# Patient Record
Sex: Male | Born: 1935 | Race: White | Hispanic: No | Marital: Married | Smoking: Former smoker
Health system: Southern US, Community
[De-identification: ages and names within clinical notes are randomized; demographics above are authoritative.]

## PROBLEM LIST (undated history)

## (undated) DIAGNOSIS — J40 Bronchitis, not specified as acute or chronic: Secondary | ICD-10-CM

## (undated) DIAGNOSIS — R569 Unspecified convulsions: Secondary | ICD-10-CM

## (undated) DIAGNOSIS — I639 Cerebral infarction, unspecified: Secondary | ICD-10-CM

## (undated) DIAGNOSIS — J309 Allergic rhinitis, unspecified: Secondary | ICD-10-CM

## (undated) DIAGNOSIS — E785 Hyperlipidemia, unspecified: Secondary | ICD-10-CM

## (undated) DIAGNOSIS — I739 Peripheral vascular disease, unspecified: Secondary | ICD-10-CM

## (undated) DIAGNOSIS — I619 Nontraumatic intracerebral hemorrhage, unspecified: Secondary | ICD-10-CM

## (undated) DIAGNOSIS — G4700 Insomnia, unspecified: Secondary | ICD-10-CM

## (undated) DIAGNOSIS — R972 Elevated prostate specific antigen [PSA]: Secondary | ICD-10-CM

## (undated) DIAGNOSIS — N390 Urinary tract infection, site not specified: Secondary | ICD-10-CM

## (undated) DIAGNOSIS — Z978 Presence of other specified devices: Secondary | ICD-10-CM

## (undated) DIAGNOSIS — Z960 Presence of urogenital implants: Secondary | ICD-10-CM

## (undated) DIAGNOSIS — N400 Enlarged prostate without lower urinary tract symptoms: Secondary | ICD-10-CM

## (undated) DIAGNOSIS — J189 Pneumonia, unspecified organism: Secondary | ICD-10-CM

## (undated) DIAGNOSIS — R0602 Shortness of breath: Secondary | ICD-10-CM

## (undated) DIAGNOSIS — G9341 Metabolic encephalopathy: Secondary | ICD-10-CM

## (undated) DIAGNOSIS — R600 Localized edema: Secondary | ICD-10-CM

## (undated) DIAGNOSIS — M179 Osteoarthritis of knee, unspecified: Secondary | ICD-10-CM

## (undated) DIAGNOSIS — I471 Supraventricular tachycardia, unspecified: Secondary | ICD-10-CM

## (undated) DIAGNOSIS — M1710 Unilateral primary osteoarthritis, unspecified knee: Secondary | ICD-10-CM

## (undated) DIAGNOSIS — R002 Palpitations: Secondary | ICD-10-CM

## (undated) HISTORY — DX: Allergic rhinitis, unspecified: J30.9

## (undated) HISTORY — PX: VASECTOMY: SHX75

## (undated) HISTORY — DX: Supraventricular tachycardia, unspecified: I47.10

## (undated) HISTORY — DX: Peripheral vascular disease, unspecified: I73.9

## (undated) HISTORY — DX: Osteoarthritis of knee, unspecified: M17.9

## (undated) HISTORY — DX: Benign prostatic hyperplasia without lower urinary tract symptoms: N40.0

## (undated) HISTORY — DX: Unspecified convulsions: R56.9

## (undated) HISTORY — DX: Palpitations: R00.2

## (undated) HISTORY — DX: Supraventricular tachycardia: I47.1

## (undated) HISTORY — PX: HEMORRHOID SURGERY: SHX153

## (undated) HISTORY — PX: CATARACT EXTRACTION: SUR2

## (undated) HISTORY — PX: US ECHOCARDIOGRAPHY: HXRAD669

## (undated) HISTORY — DX: Shortness of breath: R06.02

## (undated) HISTORY — DX: Hyperlipidemia, unspecified: E78.5

## (undated) HISTORY — DX: Metabolic encephalopathy: G93.41

## (undated) HISTORY — DX: Bronchitis, not specified as acute or chronic: J40

## (undated) HISTORY — DX: Elevated prostate specific antigen (PSA): R97.20

## (undated) HISTORY — DX: Unilateral primary osteoarthritis, unspecified knee: M17.10

## (undated) HISTORY — DX: Insomnia, unspecified: G47.00

## (undated) HISTORY — DX: Localized edema: R60.0

---

## 2000-02-26 ENCOUNTER — Other Ambulatory Visit: Admission: RE | Admit: 2000-02-26 | Discharge: 2000-02-26 | Payer: Self-pay | Admitting: Urology

## 2001-06-03 ENCOUNTER — Encounter: Payer: Self-pay | Admitting: Emergency Medicine

## 2001-06-03 ENCOUNTER — Emergency Department (HOSPITAL_COMMUNITY): Admission: EM | Admit: 2001-06-03 | Discharge: 2001-06-03 | Payer: Self-pay | Admitting: Emergency Medicine

## 2006-06-24 ENCOUNTER — Encounter: Admission: RE | Admit: 2006-06-24 | Discharge: 2006-06-24 | Payer: Self-pay | Admitting: Internal Medicine

## 2011-09-03 DIAGNOSIS — R3915 Urgency of urination: Secondary | ICD-10-CM | POA: Insufficient documentation

## 2011-09-03 DIAGNOSIS — R972 Elevated prostate specific antigen [PSA]: Secondary | ICD-10-CM | POA: Insufficient documentation

## 2012-03-03 DIAGNOSIS — N400 Enlarged prostate without lower urinary tract symptoms: Secondary | ICD-10-CM | POA: Insufficient documentation

## 2013-08-24 ENCOUNTER — Ambulatory Visit: Payer: Self-pay | Admitting: Nurse Practitioner

## 2013-08-25 ENCOUNTER — Encounter: Payer: Self-pay | Admitting: Nurse Practitioner

## 2013-08-25 ENCOUNTER — Ambulatory Visit (INDEPENDENT_AMBULATORY_CARE_PROVIDER_SITE_OTHER): Payer: Medicare Other | Admitting: Nurse Practitioner

## 2013-08-25 ENCOUNTER — Encounter (INDEPENDENT_AMBULATORY_CARE_PROVIDER_SITE_OTHER): Payer: Self-pay

## 2013-08-25 VITALS — BP 112/65 | HR 71 | Ht 68.0 in | Wt 201.0 lb

## 2013-08-25 DIAGNOSIS — Z79899 Other long term (current) drug therapy: Secondary | ICD-10-CM

## 2013-08-25 DIAGNOSIS — G40309 Generalized idiopathic epilepsy and epileptic syndromes, not intractable, without status epilepticus: Secondary | ICD-10-CM

## 2013-08-25 NOTE — Patient Instructions (Signed)
Need labs in the am, do not take Dilantin or Depakote prior to blood draw Renew meds for the next year F/U yearly and prn

## 2013-08-25 NOTE — Progress Notes (Signed)
[facility]  PATIENT: Zachary Ali DOB: May 11, 1929   REASON FOR VISIT: Followup for seizure disorder   HISTORY OF PRESENT ILLNESS: Zachary Ali, 79 year old  right-handed retired Technical sales engineer, is accompanied by his wife at today''s clinical visit returns for followup.  He was last seen 08/23/2012. No seizures in several years. Denies any falls, no assistive device.Discussed again stopping Dilantin and adding Keppra but pt is reluctant. No new complaints. He has not had recent labs. No new neurologic complaints     HISTORY: initial evaluation by  Dr Terrace Arabia 08/17/10. He is referred to this clinic, because there was no longer recurrent seizure, and with this worsening gait difficulty, wondering if we can make some adjustment of his epileptiic medications. UPDATE: he feels better after stopping PB, Dr. Terrace Arabia  tried to convince him to stop dilantin for mild gait difficulty, gingiva hypertrophy. He is hesitate to do so.  PMHx.He has past medical history of tachycardia is taking beta blocker, also has a history of seizure, has been on long-term epileptic medications.  He began to experience seizures since age 70, initially it was frequent Petit mal seizure, up to 12-16 episodes in a day, he has been treated with combination of Dilantin, and phenobarbital since age 65, without effectively control his petit mal seizure. In addition he also has some infrequent generalized tonic-clonic seizure. Iin 1976, he was started on Depakote, which has drastically change his seizure,  his  petit mal seizure was  under much better control, while he was  on Depakote and phenobarbital combination, he developed a generalized tonic-clonic seizure, Dilantin was reintroduced later, he has been on current dose of 3 medication for 35 years.   Dilantin100 mg t.i.d. phenobarbital 100 mg every morning. Depakote500 mg t.i.d.  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 Over the  past couple years, he was noticed to have unsteady gait. he denied incontinence,  bilateral lower extremity paresthesia. He has successfully weaned off phenobarbital now,  he feels better afterwards, there is no recurrent seizure.  He is continue taking depakote 500mg  tid, dilantin 100 tid.  MRI brain showed mild atrophy, no acute lesions.  EEG, had mild background slowing, no epilepsy discharges.  TODAY: 11/1/12No seizures in the past year. Discussed again stopping Dilantin and adding Keppra but pt is reluctant. No new complaints.   REVIEW OF SYSTEMS: Full 14 system review of systems performed and notable only for:  Constitutional: N/A  Cardiovascular: N/A  Ear/Nose/Throat: N/A  Skin: N/A  Eyes: N/A  Respiratory: N/A  Gastroitestinal: N/A  Hematology/Lymphatic: N/A  Endocrine: N/A Musculoskeletal:N/A  Allergy/Immunology: N/A  Neurological: N/A Psychiatric: N/A   ALLERGIES: No Known Allergies  HOME MEDICATIONS: No outpatient prescriptions prior to visit.   No facility-administered medications prior to visit.    PAST MEDICAL HISTORY: Past Medical History  Diagnosis Date  . Seizure   . SVD (spontaneous vaginal delivery)     PAST SURGICAL HISTORY: Past Surgical History  Procedure Laterality Date  . Vasectomy      FAMILY HISTORY: Family History  Problem Relation Age of Onset  . Heart failure      SOCIAL HISTORY: History   Social History  . Marital Status: Married    Spouse Name: Hope    Number of Children: 0  . Years of Education: College   Occupational History  . Not on file.   Social History Main Topics  . Smoking status: Former Games developer  . Smokeless tobacco: Never Used  .  Alcohol Use: 3.0 oz/week    5 Glasses of wine per week     Comment: weekly  . Drug Use: No  . Sexual Activity: Not on file   Other Topics Concern  . Not on file   Social History Narrative   Patient lives at home with wife [place].    Patient has no children.    Patient is  college graduated.    Patient is retired.    Patient is right handed.            PHYSICAL EXAM  Filed Vitals:   08/25/13 1405  BP: 112/65  Pulse: 71  Height: 5\' 8"  (1.727 m)  Weight: 201 lb (91.173 kg)   Body mass index is 30.57 kg/(m^2).  Generalized: Well developed, in no acute distress   Neurological examination   Mentation: Alert oriented to time, place, history taking. Follows all commands speech and language fluent  Cranial nerve II-XII: Pupils were equal round reactive to light extraocular movements were full, visual field were full on confrontational test. Facial sensation and strength were normal. hearing was intact to finger rubbing bilaterally. Uvula tongue midline. head turning and shoulder shrug and were normal and symmetric.Tongue protrusion into cheek strength was normal. Motor: normal bulk and tone, full strength in the BUE, BLE, fine finger movements normal, no pronator drift. No focal weakness Coordination: finger-nose-finger, heel-to-shin bilaterally, no dysmetria Reflexes: Diminished and symmetric upper and lower Gait and Station: Rising up from seated position without assistance, wide based cautious gait no difficulty with turning, no assistive device   DIAGNOSTIC DATA (LABS, IMAGING, TESTING) -None to review   ASSESSMENT AND PLAN  79 y.o. year old male  has a past medical history of Seizure and small vessel disease. Patient is currently on Depakote and Dilantin and denies side effects attempts have been made to taper him off of Dilantin in place him on Keppra however he has not been willing to do so  Will obtain trough labs in the am. Do not take Dilantin or Depakote prior to blood draw Renew meds for the next year after labs are back F/U yearly and prn  Nilda Riggs, [facility], [facility], APRN  [facility] [address], [address] [place], [place] [address] [phone]

## 2013-08-26 ENCOUNTER — Other Ambulatory Visit (INDEPENDENT_AMBULATORY_CARE_PROVIDER_SITE_OTHER): Payer: Medicare Other

## 2013-08-26 DIAGNOSIS — Z0289 Encounter for other administrative examinations: Secondary | ICD-10-CM

## 2013-08-27 LAB — CBC WITH DIFFERENTIAL/PLATELET
Basophils Absolute: 0 10*3/uL (ref 0.0–0.2)
Basos: 1 %
Eos: 3 %
Eosinophils Absolute: 0.2 10*3/uL (ref 0.0–0.4)
HCT: 39.9 % (ref 37.5–51.0)
Hemoglobin: 13.7 g/dL (ref 12.6–17.7)
Lymphocytes Absolute: 1.6 10*3/uL (ref 0.7–3.1)
Lymphs: 26 %
MCH: 30.4 pg (ref 26.6–33.0)
MCHC: 34.3 g/dL (ref 31.5–35.7)
MCV: 89 fL (ref 79–97)
Monocytes Absolute: 0.9 10*3/uL (ref 0.1–0.9)
Monocytes: 14 %
Neutrophils Absolute: 3.4 10*3/uL (ref 1.4–7.0)
Neutrophils Relative %: 56 %
RBC: 4.5 x10E6/uL (ref 4.14–5.80)
RDW: 17.6 % — ABNORMAL HIGH (ref 12.3–15.4)
WBC: 6.1 10*3/uL (ref 3.4–10.8)

## 2013-08-27 LAB — COMPREHENSIVE METABOLIC PANEL
ALT: 19 IU/L (ref 0–44)
AST: 24 IU/L (ref 0–40)
Albumin/Globulin Ratio: 1.7 (ref 1.1–2.5)
Albumin: 3.9 g/dL (ref 3.5–4.7)
Alkaline Phosphatase: 69 IU/L (ref 39–117)
BUN/Creatinine Ratio: 29 — ABNORMAL HIGH (ref 10–22)
BUN: 21 mg/dL (ref 8–27)
CO2: 30 mmol/L — ABNORMAL HIGH (ref 18–29)
Calcium: 8.9 mg/dL (ref 8.6–10.2)
Chloride: 105 mmol/L (ref 96–108)
Creatinine, Ser: 0.73 mg/dL — ABNORMAL LOW (ref 0.76–1.27)
GFR calc Af Amer: 100 mL/min/{1.73_m2} (ref >59)
GFR calc non Af Amer: 86 mL/min/{1.73_m2} (ref >59)
Globulin, Total: 2.3 g/dL (ref 1.5–4.5)
Glucose: 90 mg/dL (ref 65–99)
Potassium: 4.3 mmol/L (ref 3.5–5.2)
Sodium: 142 mmol/L (ref 134–144)
Total Bilirubin: 0.3 mg/dL (ref 0.0–1.2)
Total Protein: 6.2 g/dL (ref 6.0–8.5)

## 2013-08-27 NOTE — Progress Notes (Signed)
Quick Note:  Shared good labs per Ms Daphine Deutscher with patient, he understood ______

## 2013-10-04 ENCOUNTER — Other Ambulatory Visit: Payer: Self-pay

## 2013-10-04 MED ORDER — DIVALPROEX SODIUM 500 MG PO DR TAB: 500.0000 mg | DELAYED_RELEASE_TABLET | Freq: Three times a day (TID) | ORAL | Status: DC

## 2013-10-29 ENCOUNTER — Other Ambulatory Visit: Payer: Self-pay

## 2013-10-29 MED ORDER — DIVALPROEX SODIUM 500 MG PO DR TAB: 500.0000 mg | DELAYED_RELEASE_TABLET | Freq: Three times a day (TID) | ORAL | Status: DC

## 2013-11-30 ENCOUNTER — Other Ambulatory Visit: Payer: Self-pay | Admitting: Neurology

## 2013-12-10 ENCOUNTER — Other Ambulatory Visit: Payer: Self-pay

## 2013-12-10 MED ORDER — PHENYTOIN SODIUM EXTENDED 100 MG PO CAPS: 100.0000 mg | ORAL_CAPSULE | Freq: Three times a day (TID) | ORAL | Status: DC

## 2014-04-29 ENCOUNTER — Encounter: Payer: Self-pay | Admitting: *Deleted

## 2014-06-25 ENCOUNTER — Emergency Department (HOSPITAL_COMMUNITY): Payer: Medicare Other

## 2014-06-25 ENCOUNTER — Encounter (HOSPITAL_COMMUNITY): Payer: Self-pay | Admitting: Emergency Medicine

## 2014-06-25 ENCOUNTER — Inpatient Hospital Stay (HOSPITAL_COMMUNITY)
Admission: EM | Admit: 2014-06-25 | Discharge: 2014-06-29 | DRG: 689 | Disposition: A | Discharge status: Skilled Nursing Facility | Payer: Medicare Other | Attending: Internal Medicine | Admitting: Internal Medicine

## 2014-06-25 ENCOUNTER — Inpatient Hospital Stay (HOSPITAL_COMMUNITY): Payer: Medicare Other

## 2014-06-25 DIAGNOSIS — D696 Thrombocytopenia, unspecified: Secondary | ICD-10-CM | POA: Diagnosis present

## 2014-06-25 DIAGNOSIS — R339 Retention of urine, unspecified: Secondary | ICD-10-CM | POA: Diagnosis not present

## 2014-06-25 DIAGNOSIS — I10 Essential (primary) hypertension: Secondary | ICD-10-CM | POA: Diagnosis present

## 2014-06-25 DIAGNOSIS — R7989 Other specified abnormal findings of blood chemistry: Secondary | ICD-10-CM

## 2014-06-25 DIAGNOSIS — N390 Urinary tract infection, site not specified: Secondary | ICD-10-CM | POA: Diagnosis present

## 2014-06-25 DIAGNOSIS — G8190 Hemiplegia, unspecified affecting unspecified side: Secondary | ICD-10-CM | POA: Diagnosis present

## 2014-06-25 DIAGNOSIS — G40909 Epilepsy, unspecified, not intractable, without status epilepticus: Secondary | ICD-10-CM | POA: Diagnosis present

## 2014-06-25 DIAGNOSIS — G9340 Encephalopathy, unspecified: Secondary | ICD-10-CM | POA: Diagnosis present

## 2014-06-25 DIAGNOSIS — Z87891 Personal history of nicotine dependence: Secondary | ICD-10-CM | POA: Diagnosis not present

## 2014-06-25 DIAGNOSIS — E785 Hyperlipidemia, unspecified: Secondary | ICD-10-CM | POA: Diagnosis present

## 2014-06-25 DIAGNOSIS — I63239 Cerebral infarction due to unspecified occlusion or stenosis of unspecified carotid arteries: Secondary | ICD-10-CM

## 2014-06-25 DIAGNOSIS — I639 Cerebral infarction, unspecified: Secondary | ICD-10-CM | POA: Diagnosis present

## 2014-06-25 DIAGNOSIS — I63132 Cerebral infarction due to embolism of left carotid artery: Secondary | ICD-10-CM

## 2014-06-25 DIAGNOSIS — Z79899 Other long term (current) drug therapy: Secondary | ICD-10-CM | POA: Diagnosis not present

## 2014-06-25 DIAGNOSIS — G40309 Generalized idiopathic epilepsy and epileptic syndromes, not intractable, without status epilepticus: Secondary | ICD-10-CM

## 2014-06-25 DIAGNOSIS — R4789 Other speech disturbances: Secondary | ICD-10-CM | POA: Diagnosis present

## 2014-06-25 DIAGNOSIS — I63039 Cerebral infarction due to thrombosis of unspecified carotid artery: Secondary | ICD-10-CM

## 2014-06-25 DIAGNOSIS — R4182 Altered mental status, unspecified: Secondary | ICD-10-CM

## 2014-06-25 LAB — CBC WITH DIFFERENTIAL/PLATELET
BASOS ABS: 0 10*3/uL (ref 0.0–0.1)
Basophils Relative: 0 % (ref 0–1)
EOS ABS: 0 10*3/uL (ref 0.0–0.7)
Eosinophils Relative: 0 % (ref 0–5)
HEMATOCRIT: 39.1 % (ref 39.0–52.0)
Hemoglobin: 13.1 g/dL (ref 13.0–17.0)
Lymphocytes Relative: 13 % (ref 12–46)
Lymphs Abs: 1.4 10*3/uL (ref 0.7–4.0)
MCH: 32.6 pg (ref 26.0–34.0)
MCHC: 33.5 g/dL (ref 30.0–36.0)
MCV: 97.3 fL (ref 78.0–100.0)
Monocytes Absolute: 1.7 10*3/uL — ABNORMAL HIGH (ref 0.1–1.0)
Monocytes Relative: 16 % — ABNORMAL HIGH (ref 3–12)
NEUTROS ABS: 7.6 10*3/uL (ref 1.7–7.7)
NEUTROS PCT: 71 % (ref 43–77)
Platelets: 89 10*3/uL — ABNORMAL LOW (ref 150–400)
RBC: 4.02 MIL/uL — ABNORMAL LOW (ref 4.22–5.81)
RDW: 14.4 % (ref 11.5–15.5)
WBC: 10.7 10*3/uL — ABNORMAL HIGH (ref 4.0–10.5)

## 2014-06-25 LAB — COMPREHENSIVE METABOLIC PANEL
ALT: 17 U/L (ref 0–53)
AST: 23 U/L (ref 0–37)
Albumin: 2.8 g/dL — ABNORMAL LOW (ref 3.5–5.2)
Alkaline Phosphatase: 61 U/L (ref 39–117)
Anion gap: 13 (ref 5–15)
BUN: 22 mg/dL (ref 6–23)
CALCIUM: 8.6 mg/dL (ref 8.4–10.5)
CO2: 21 mEq/L (ref 19–32)
CREATININE: 0.84 mg/dL (ref 0.50–1.35)
Chloride: 103 mEq/L (ref 96–112)
GFR calc Af Amer: >90 mL/min (ref >90)
GFR calc non Af Amer: 79 mL/min — ABNORMAL LOW (ref >90)
GLUCOSE: 96 mg/dL (ref 70–99)
Potassium: 3.9 mEq/L (ref 3.7–5.3)
SODIUM: 137 meq/L (ref 137–147)
Total Bilirubin: 0.3 mg/dL (ref 0.3–1.2)
Total Protein: 6 g/dL (ref 6.0–8.3)

## 2014-06-25 LAB — TROPONIN I

## 2014-06-25 LAB — PRO B NATRIURETIC PEPTIDE: Pro B Natriuretic peptide (BNP): 953 pg/mL — ABNORMAL HIGH (ref 0–450)

## 2014-06-25 MED ORDER — PHENYTOIN SODIUM EXTENDED 100 MG PO CAPS
100.0000 mg | ORAL_CAPSULE | Freq: Three times a day (TID) | ORAL | Status: DC
  Administered 2014-06-26 – 2014-06-29 (×10): 100 mg via ORAL
  Filled 2014-06-25 (×9): qty 1

## 2014-06-25 MED ORDER — STROKE: EARLY STAGES OF RECOVERY BOOK
Freq: Once | Status: DC
  Filled 2014-06-25 (×2): qty 1

## 2014-06-25 MED ORDER — SENNOSIDES-DOCUSATE SODIUM 8.6-50 MG PO TABS
1.0000 | ORAL_TABLET | Freq: Every evening | ORAL | Status: DC | PRN
  Filled 2014-06-25: qty 1

## 2014-06-25 MED ORDER — METOPROLOL SUCCINATE ER 25 MG PO TB24
25.0000 mg | ORAL_TABLET | Freq: Every day | ORAL | Status: DC
  Administered 2014-06-26 – 2014-06-29 (×4): 25 mg via ORAL
  Filled 2014-06-25 (×4): qty 1

## 2014-06-25 MED ORDER — ENOXAPARIN SODIUM 30 MG/0.3ML ~~LOC~~ SOLN
30.0000 mg | SUBCUTANEOUS | Status: DC
  Administered 2014-06-25 – 2014-06-26 (×2): 30 mg via SUBCUTANEOUS
  Filled 2014-06-25 (×2): qty 0.3

## 2014-06-25 MED ORDER — DIVALPROEX SODIUM 500 MG PO DR TAB
500.0000 mg | DELAYED_RELEASE_TABLET | Freq: Three times a day (TID) | ORAL | Status: DC
  Administered 2014-06-26 – 2014-06-29 (×10): 500 mg via ORAL
  Filled 2014-06-25 (×9): qty 1

## 2014-06-25 NOTE — ED Notes (Signed)
Patient transported to CT 

## 2014-06-25 NOTE — ED Notes (Signed)
Pt refusing to be cleaned. Pt aware he has urine on his pants and gown. This RN asked patient if I could change his gown, linens and remove his pants. Pt refused. I told him politely I do not want him sitting in his own urine and patient stated he knows and he does not want to be changed or cleaned. Wife at bedside and aware and is standing by patients wishes.

## 2014-06-25 NOTE — Consult Note (Signed)
Referring Physician: Radford Pax    Chief Complaint: Difficulty with speech  HPI: Zachary Ali is an 80 y.o. 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  He noted at 9PM that he was having dizziness and also noted that he was having difficulty playing the piece.  He was able to drive home after practice and his wife noted that he was having difficulty with speech.  He slept last evening and on awakening his symptoms were still present but did improve in the morning.  After eating breakfast they recurred and have continued.  Had difficulty with gait and needed to hold onto his wife for ambulation.  Patient presented for evaluation at her request.    Date last known well: Date: 06/24/2014 Time last known well: Time: 21:00 tPA Given: No: Outside time window  Past Medical History  Diagnosis Date  . Seizure     since childhood, Dr. Debarah Crape; off phenoparb since 2011, after many yr, doing ok  . Small vessel disease   . Allergic rhinitis   . BPH (benign prostatic hypertrophy)     seen urology, Dr. Earlene Plater  . SVT (supraventricular tachycardia)   . Elevated PSA     dr. Earlene Plater PSA 3-6; no PSA-urology f/u for exam yrly  . Mild depression   . Dyslipidemia     diet control  . Edema leg     hx of  . Insomnia   . OA (osteoarthritis) of knee     left   . Bronchitis     episode of 2/10  . SOB (shortness of breath)   . Palpitations     w/u with Card-Dr. Anne Fu  . Paroxysmal supraventricular tachycardia     Past Surgical History  Procedure Laterality Date  . Vasectomy    . US echocardiography      ok; stress test ok-05-2013 nl LVF, tr AR  . Hemorrhoid surgery    . Cataract extraction      2011-12    Family History  Problem Relation Age of Onset  . Heart failure    . Heart disease Mother   . Coronary artery disease Mother   . Coronary artery disease Father   . Heart disease Father    Social History:  reports that he has quit smoking. He has never used smokeless  tobacco. He reports that he drinks about 3 ounces of alcohol per week. He reports that he does not use illicit drugs.  Allergies: No Known Allergies  Medications: I have reviewed the patient's current medications. Prior to Admission:  Current outpatient prescriptions:divalproex (DEPAKOTE) 500 MG DR tablet, Take 500 mg by mouth 3 (three) times daily., Disp: , Rfl: ;  metoprolol succinate (TOPROL-XL) 25 MG 24 hr tablet, Take 25 mg by mouth daily., Disp: , Rfl: ;  phenytoin (DILANTIN) 100 MG ER capsule, Take 1 capsule (100 mg total) by mouth 3 (three) times daily., Disp: 90 capsule, Rfl: 8  ROS: History obtained from the patient  General ROS: negative for - chills, fatigue, fever, night sweats, weight gain or weight loss Psychological ROS: negative for - behavioral disorder, hallucinations, memory difficulties, mood swings or suicidal ideation Ophthalmic ROS: negative for - blurry vision, double vision, eye pain or loss of vision ENT ROS: negative for - epistaxis, nasal discharge, oral lesions, sore throat, tinnitus or vertigo Allergy and Immunology ROS: negative for - hives or itchy/watery eyes Hematological and Lymphatic ROS: negative for - bleeding problems, bruising or swollen lymph nodes  Endocrine ROS: negative for - galactorrhea, hair pattern changes, polydipsia/polyuria or temperature intolerance Respiratory ROS: negative for - cough, hemoptysis, shortness of breath or wheezing Cardiovascular ROS: BLE edema Gastrointestinal ROS: negative for - abdominal pain, diarrhea, hematemesis, nausea/vomiting or stool incontinence Genito-Urinary ROS: negative for - dysuria, hematuria, incontinence or urinary frequency/urgency Musculoskeletal ROS: negative for - joint swelling or muscular weakness Neurological ROS: as noted in HPI Dermatological ROS: negative for rash and skin lesion changes  Physical Examination: Blood pressure 138/55, pulse 80, temperature 98.9 F (37.2 C), temperature source  Oral, resp. rate 21, SpO2 100.00%.  Neurologic Examination: Mental Status: Alert, oriented.  Speech halting and at times responses not appropriate to questions being asked.  Follows some simple commands but has difficulty with others, ie told patient to open his eyes and he kept opening his mouth instead.  Unable to follow 3-step commands.  Patient does not appreciate any deficits.   Cranial Nerves: II: Discs flat bilaterally; Visual fields grossly normal, pupils equal, round, reactive to light and accommodation III,IV, VI: ptosis not present, extra-ocular motions intact bilaterally V,VII: smile symmetric, facial light touch sensation normal bilaterally VIII: hearing normal bilaterally IX,X: gag reflex present XI: bilateral shoulder shrug XII: midline tongue extension Motor: Right : Upper extremity   5-/5 With pronator drift   Left:     Upper extremity   5/5  Lower extremity   4+/5      Lower extremity   5/5 Tone and bulk:normal tone throughout; no atrophy noted Sensory: Pinprick and light touch intact throughout, bilaterally Deep Tendon Reflexes: 2+ and symmetric in the upper extremities and absent in the lower extremities.   Plantars: Right: upgoing   Left: downgoing Cerebellar: normal finger-to-nose and normal heel-to-shin testing bilaterally Gait: Unable to test secondary to safety concerns CV: pulses palpable throughout     Laboratory Studies:  Basic Metabolic Panel:  Recent Labs Lab 06/25/14 1901  NA 137  K 3.9  CL 103  CO2 21  GLUCOSE 96  BUN 22  CREATININE 0.84  CALCIUM 8.6    Liver Function Tests:  Recent Labs Lab 06/25/14 1901  AST 23  ALT 17  ALKPHOS 61  BILITOT 0.3  PROT 6.0  ALBUMIN 2.8*   No results found for this basename: LIPASE, AMYLASE,  in the last 168 hours No results found for this basename: AMMONIA,  in the last 168 hours  CBC:  Recent Labs Lab 06/25/14 1901  WBC 10.7*  NEUTROABS 7.6  HGB 13.1  HCT 39.1  MCV 97.3  PLT 89*     Cardiac Enzymes:  Recent Labs Lab 06/25/14 1902  TROPONINI <0.30    BNP: No components found with this basename: POCBNP,   CBG: No results found for this basename: GLUCAP,  in the last 168 hours  Microbiology: No results found for this or any previous visit.  Coagulation Studies: No results found for this basename: LABPROT, INR,  in the last 72 hours  Urinalysis: No results found for this basename: COLORURINE, APPERANCEUR, LABSPEC, PHURINE, GLUCOSEU, HGBUR, BILIRUBINUR, KETONESUR, PROTEINUR, UROBILINOGEN, NITRITE, LEUKOCYTESUR,  in the last 168 hours  Lipid Panel: No results found for this basename: chol, trig, hdl, cholhdl, vldl, ldlcalc    HgbA1C:  No results found for this basename: HGBA1C    Urine Drug Screen:   No results found for this basename: labopia, cocainscrnur, labbenz, amphetmu, thcu, labbarb    Alcohol Level: No results found for this basename: ETH,  in the last 168 hours  Other results:  EKG: sinus rhythm at 77 bpm.  Imaging: Dg Chest 2 View  06/25/2014   CLINICAL DATA:  Cough.  EXAM: CHEST  2 VIEW  COMPARISON:  04/08/2013.  FINDINGS: The cardiac silhouette, mediastinal and hilar contours are within normal limits and stable. There is tortuosity and calcification of the thoracic aorta. The lungs are clear. No pleural effusion. The bony thorax is intact.  IMPRESSION: No acute cardiopulmonary findings.   Electronically Signed   By: Loralie Champagne M.D.   On: 06/25/2014 19:14   Ct Head Wo Contrast  06/25/2014   CLINICAL DATA:  Slurred speech and difficulty walking.  EXAM: CT HEAD WITHOUT CONTRAST  TECHNIQUE: Contiguous axial images were obtained from the base of the skull through the vertex without intravenous contrast.  COMPARISON:  06/24/2006.  FINDINGS: Stable age related cerebral atrophy, ventriculomegaly and periventricular white matter disease. 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 No extra-axial fluid collections are identified.  No CT findings for acute hemispheric infarction or intracranial hemorrhage. No mass lesions. The brainstem and cerebellum are normal.  The bony structures are intact. The paranasal sinuses and mastoid air cells are clear. The globes are intact.  IMPRESSION: Age related cerebral atrophy, ventriculomegaly and periventricular white matter disease.  No acute intracranial findings or mass lesions.   Electronically Signed   By: Loralie Champagne M.D.   On: 06/25/2014 18:28    Assessment: 80 y.o. male presenting with difficulty with speech and a mild right hemiparesis.  Suspect and acute left brain ischemic event.  Head CT reviewed and unremarkable.  Patient presented outside of the time window and therefore not a tPA candidate.  Further work up recommended.    Stroke Risk Factors - hyperlipidemia  Plan: 1. HgbA1c, fasting lipid panel, Dilantin level, Depakote level 2. MRI, MRA  of the brain without contrast 3. PT consult, OT consult, Speech consult 4. Echocardiogram 5. Carotid dopplers 6. Prophylactic therapy-Antiplatelet med: Aspirin - dose  daily 7. Risk factor modification 8. Telemetry monitoring 9. Frequent neuro checks   Thana Farr, MD [facility] [phone] 06/25/2014, 9:04 PM

## 2014-06-25 NOTE — ED Notes (Signed)
Pt refused to pull pants down to use urinal, Pt continued to urinate on self. Pt also refused to take wet pants and underwear off.

## 2014-06-25 NOTE — Progress Notes (Signed)
Patient received from MRI accompanied by E.D nurse . Oriented to unit and room.

## 2014-06-25 NOTE — ED Notes (Signed)
Report attempted 

## 2014-06-25 NOTE — ED Notes (Signed)
Per EMS- pt reports last night slurred speech and trouble walking; called PCP and had appointment this afternoon. At current speech is clear, having some difficulty finding his words. CBG 131, BP 100/54.

## 2014-06-25 NOTE — H&P (Signed)
Called e.d for report. Report receiced. Told patient was going to MRI before coming to floor.

## 2014-06-25 NOTE — ED Provider Notes (Signed)
CSN: [id number]     Arrival date & time 06/25/14  1723 History   First MD Initiated Contact with Patient 06/25/14 1729     Chief Complaint  Patient presents with  . 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  Patient according to his wife still has some confusion which is not normal for him.  He's had no previous history of stroke in the past. Past Medical History  Diagnosis Date  . Seizure     since childhood, Dr. Debarah Crape; off phenoparb since 2011, after many yr, doing ok  . Small vessel disease   . Allergic rhinitis   . BPH (benign prostatic hypertrophy)     seen urology, Dr. Earlene Plater  . SVT (supraventricular tachycardia)   . Elevated PSA     dr. Earlene Plater PSA 3-6; no PSA-urology f/u for exam yrly  . Mild depression   . Dyslipidemia     diet control  . Edema leg     hx of  . Insomnia   . OA (osteoarthritis) of knee     left   . Bronchitis     episode of 2/10  . SOB (shortness of breath)   . Palpitations     w/u with Card-Dr. Anne Fu  . Paroxysmal supraventricular tachycardia    Past Surgical History  Procedure Laterality Date  . Vasectomy    . US echocardiography      ok; stress test ok-05-2013 nl LVF, tr AR  . Hemorrhoid surgery    . Cataract extraction      2011-12   Family History  Problem Relation Age of Onset  . Heart failure    . Heart disease Mother   . Coronary artery disease Mother   . Coronary artery disease Father   . Heart disease Father    History  Substance Use Topics  . Smoking status: Former Games developer  . Smokeless tobacco: Never Used  . Alcohol Use: 3.0 oz/week    5 Glasses of wine per week     Comment: weekly    Review of Systems  All other systems reviewed and are negative  Allergies  Review of patient's allergies indicates no known allergies.  Home Medications   Prior to Admission medications   Medication Sig Start Date End Date  Taking? Authorizing Provider  divalproex (DEPAKOTE) 500 MG DR tablet Take 500 mg by mouth 3 (three) times daily.   Yes Historical Provider, MD  metoprolol succinate (TOPROL-XL) 25 MG 24 hr tablet Take 25 mg by mouth daily.   Yes Historical Provider, MD  phenytoin (DILANTIN) 100 MG ER capsule Take 1 capsule (100 mg total) by mouth 3 (three) times daily. 12/10/13  Yes Carmen Dohmeier, MD   BP 150/78  Pulse 80  Temp(Src) 98.1 F (36.7 C) (Oral)  Resp 18  Ht  (1.727 m)  Wt 171 lb 8.3 oz (77.8 kg)  BMI 26.09 kg/m2  SpO2 99% Physical Exam Physical Exam  Nursing note and vitals reviewed. Constitutional: He is oriented to person, place, and time. He appears well-developed and well-nourished. No distress.  HENT:  Head: Normocephalic and atraumatic.  Eyes: Pupils are equal, round, and reactive to light.  Neck: Normal range of motion.  Cardiovascular: Normal rate and intact distal pulses.  4+ pitting edema bilaterally  Pulmonary/Chest: No respiratory distress.  Abdominal: Normal appearance. He exhibits no distension.  Musculoskeletal: Normal  range of motion.  Neurological: He is alert and oriented to person, place,.. No cranial nerve deficit.  patient is confused about his your birth and his age.  This is not normal according to the wife.  Patient also seems to have periods where he pauses prior to being able to form his words.  I don't find any lateralizing muscular weakness. Skin: Skin is warm and dry. No rash noted.  Psychiatric: He has a normal mood and affect. His behavior is normal.   ED Course  Procedures (including critical care time)  Patient seen and evaluted by neurology in ED.  Stabilized for admission  CRITICAL CARE Performed by: Nelva Nay L Total critical care time: 30 min Critical care time was exclusive of separately billable procedures and treating other patients. Critical care was necessary to treat or prevent imminent or life-threatening deterioration. Critical  care was time spent personally by me on the following activities: development of treatment plan with patient and/or surrogate as well as nursing, discussions with consultants, evaluation of patient's response to treatment, examination of patient, obtaining history from patient or surrogate, ordering and performing treatments and interventions, ordering and review of laboratory studies, ordering and review of radiographic studies, pulse oximetry and re-evaluation of patient's condition.  Labs Review Labs Reviewed  PRO B NATRIURETIC PEPTIDE - Abnormal; Notable for the following:    Pro B Natriuretic peptide (BNP) 953.0 (*)    All other components within normal limits  CBC WITH DIFFERENTIAL - Abnormal; Notable for the following:    WBC 10.7 (*)    RBC 4.02 (*)    Platelets 89 (*)    Monocytes Relative 16 (*)    Monocytes Absolute 1.7 (*)    All other components within normal limits  COMPREHENSIVE METABOLIC PANEL - Abnormal; Notable for the following:    Albumin 2.8 (*)    GFR calc non Af Amer 79 (*)    All other components within normal limits  CBC - Abnormal; Notable for the following:    RBC 3.94 (*)    HCT 37.5 (*)    Platelets 76 (*)    All other components within normal limits  CREATININE, SERUM - Abnormal; Notable for the following:    GFR calc non Af Amer 80 (*)    All other components within normal limits  TROPONIN I  LIPID PANEL  AMMONIA  HEMOGLOBIN A1C  URINALYSIS, ROUTINE W REFLEX MICROSCOPIC  PHENYTOIN LEVEL, TOTAL  VALPROIC ACID LEVEL    Imaging Review Dg Chest 2 View  06/25/2014   CLINICAL DATA:  Cough.  EXAM: CHEST  2 VIEW  COMPARISON:  04/08/2013.  FINDINGS: The cardiac silhouette, mediastinal and hilar contours are within normal limits and stable. There is tortuosity and calcification of the thoracic aorta. The lungs are clear. No pleural effusion. The bony thorax is intact.  IMPRESSION: No acute cardiopulmonary findings.   Electronically Signed   By: Loralie Champagne M.D.   On: 06/25/2014 19:14   Ct Head Wo Contrast  06/25/2014   CLINICAL DATA:  Slurred speech and difficulty walking.  EXAM: CT HEAD WITHOUT CONTRAST  TECHNIQUE: Contiguous axial images were obtained from the base of the skull through the vertex without intravenous contrast.  COMPARISON:  06/24/2006.  FINDINGS: Stable age related cerebral atrophy, ventriculomegaly and periventricular white matter disease. 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 No extra-axial fluid collections are identified. No CT findings for acute hemispheric infarction or intracranial hemorrhage. No mass lesions. The brainstem and cerebellum are  normal.  The bony structures are intact. The paranasal sinuses and mastoid air cells are clear. The globes are intact.  IMPRESSION: Age related cerebral atrophy, ventriculomegaly and periventricular white matter disease.  No acute intracranial findings or mass lesions.   Electronically Signed   By: Loralie Champagne M.D.   On: 06/25/2014 18:28   Mr Brain Wo Contrast  06/25/2014   CLINICAL DATA:  Mild right hemi paresis, speech difficulty.  EXAM: MRI HEAD WITHOUT CONTRAST  MRA HEAD WITHOUT CONTRAST  TECHNIQUE: Multiplanar, multiecho pulse sequences of the brain and surrounding structures were obtained without intravenous contrast. Angiographic images of the head were obtained using MRA technique without contrast.  COMPARISON:  None.  Prior CT from earlier the same day.  FINDINGS: MRI HEAD FINDINGS  Diffuse prominence of the CSF containing spaces is compatible with age-related generalized cerebral atrophy. Minimal T2/FLAIR hyperintensity seen within the periventricular and deep white matter, within normal limits for patient age.  No diffusion-weighted signal abnormality to suggest acute intracranial infarct. Gray-white matter differentiation well maintained. Normal flow voids seen within the intracranial vasculature. There is no intracranial hemorrhage. Few punctate  chronic micro hemorrhages noted within the left frontal lobe, which may be related to underlying hypertension.  No mass lesion or midline shift. Mild ventricular prominence related at global atrophy present without hydrocephalus. No extra-axial fluid collection.  Craniocervical junction widely patent. Pituitary gland within normal limits. No acute abnormality seen about either orbit. Lens replacement noted at the left orbit.  Calvarium and scalp soft tissues demonstrate a normal appearance. Mild multilevel degenerative spondylolysis noted within the upper cervical spine.  Paranasal sinuses and mastoid air cells are clear.  MRA HEAD FINDINGS  ANTERIOR CIRCULATION:  Study is degraded by motion artifact.  Visualized portions of the distal atherosclerotic irregularity present within the cavernous carotid arteries bilaterally without hemodynamically significant stenosis. Supra clinoid segments well opacified.  Multi focal atherosclerotic irregularity noted within the A1 segments without significant stenosis. Anterior communicating artery and anterior cerebral arteries are within normal limits.  Moderate multi focal atherosclerotic irregularity present within the M1 segments bilaterally. There is a severe stenosis within the distal right M1 segment. Stenosis measures approximately 3 mm in length. The right MCA branches are well opacified distally. Left MCA branches are normal.  No aneurysm within the anterior circulation.  POSTERIOR CIRCULATION:  Vertebral arteries are codominant without significant stenosis or occlusion. Posterior inferior cerebral arteries not well visualized. Vertebrobasilar junction and basilar artery are widely patent with antegrade flow. Proximal superior cerebral arteries are widely patent. Fetal origin of the left PCA noted. Posterior lower cerebral arteries are opacified without hemodynamically significant stenosis.  IMPRESSION: MRI HEAD IMPRESSION:  1. No acute intracranial infarct or other  abnormality identified. 2. Moderate generalized cerebral atrophy.  MRA HEAD IMPRESSION:  1. Severe short-segment stenosis within the mid -distal right M1 segment. No other hemodynamically significant stenosis or proximal branch occlusion identified within the intracranial circulation. 2. Moderate multi focal atherosclerotic irregularity within the cavernous carotid arteries and middle cerebral arteries bilaterally. 3. Fetal origin of the left PCA.   Electronically Signed   By: Rise Mu M.D.   On: 06/25/2014 23:43   Mr Maxine Glenn Head/brain Wo Cm  06/25/2014   CLINICAL DATA:  Mild right hemi paresis, speech difficulty.  EXAM: MRI HEAD WITHOUT CONTRAST  MRA HEAD WITHOUT CONTRAST  TECHNIQUE: Multiplanar, multiecho pulse sequences of the brain and surrounding structures were obtained without intravenous contrast. Angiographic images of the head were obtained using MRA technique  without contrast.  COMPARISON:  None.  Prior CT from earlier the same day.  FINDINGS: MRI HEAD FINDINGS  Diffuse prominence of the CSF containing spaces is compatible with age-related generalized cerebral atrophy. Minimal T2/FLAIR hyperintensity seen within the periventricular and deep white matter, within normal limits for patient age.  No diffusion-weighted signal abnormality to suggest acute intracranial infarct. Gray-white matter differentiation well maintained. Normal flow voids seen within the intracranial vasculature. There is no intracranial hemorrhage. Few punctate chronic micro hemorrhages noted within the left frontal lobe, which may be related to underlying hypertension.  No mass lesion or midline shift. Mild ventricular prominence related at global atrophy present without hydrocephalus. No extra-axial fluid collection.  Craniocervical junction widely patent. Pituitary gland within normal limits. No acute abnormality seen about either orbit. Lens replacement noted at the left orbit.  Calvarium and scalp soft tissues  demonstrate a normal appearance. Mild multilevel degenerative spondylolysis noted within the upper cervical spine.  Paranasal sinuses and mastoid air cells are clear.  MRA HEAD FINDINGS  ANTERIOR CIRCULATION:  Study is degraded by motion artifact.  Visualized portions of the distal atherosclerotic irregularity present within the cavernous carotid arteries bilaterally without hemodynamically significant stenosis. Supra clinoid segments well opacified.  Multi focal atherosclerotic irregularity noted within the A1 segments without significant stenosis. Anterior communicating artery and anterior cerebral arteries are within normal limits.  Moderate multi focal atherosclerotic irregularity present within the M1 segments bilaterally. There is a severe stenosis within the distal right M1 segment. Stenosis measures approximately 3 mm in length. The right MCA branches are well opacified distally. Left MCA branches are normal.  No aneurysm within the anterior circulation.  POSTERIOR CIRCULATION:  Vertebral arteries are codominant without significant stenosis or occlusion. Posterior inferior cerebral arteries not well visualized. Vertebrobasilar junction and basilar artery are widely patent with antegrade flow. Proximal superior cerebral arteries are widely patent. Fetal origin of the left PCA noted. Posterior lower cerebral arteries are opacified without hemodynamically significant stenosis.  IMPRESSION: MRI HEAD IMPRESSION:  1. No acute intracranial infarct or other abnormality identified. 2. Moderate generalized cerebral atrophy.  MRA HEAD IMPRESSION:  1. Severe short-segment stenosis within the mid -distal right M1 segment. No other hemodynamically significant stenosis or proximal branch occlusion identified within the intracranial circulation. 2. Moderate multi focal atherosclerotic irregularity within the cavernous carotid arteries and middle cerebral arteries bilaterally. 3. Fetal origin of the left PCA.    Electronically Signed   By: Rise Mu M.D.   On: 06/25/2014 23:43     EKG Interpretation   Date/Time:  Friday June 25 2014 17:32:36 EDT Ventricular Rate:  77 PR Interval:  182 QRS Duration: 89 QT Interval:  369 QTC Calculation: 418 R Axis:   66 Text Interpretation:  Sinus rhythm Confirmed by Avryl Roehm  MD, Norrin Shreffler ([id number])  on 06/25/2014 6:10:14 PM      MDM   Final diagnoses:  Altered mental status, unspecified altered mental status type       Nelia Shi, MD 06/26/14 1050

## 2014-06-25 NOTE — ED Notes (Signed)
Pt still refusing to be cleaned. RN notified.

## 2014-06-25 NOTE — H&P (Signed)
Hospitalist Admission History and Physical  Patient name: Zachary Ali Medical record number: [id number] Date of birth: 04/07/1929 Age: 80 y.o. Gender: male  Primary Care Provider: Georgann Housekeeper, MD  Chief Complaint: encephalopathy, CVA  History of Present Illness:This is a 80 y.o. year old male with significant past medical history of seizure disorder, hx/o SVT, ? Cerebral aneurysm 50-60 years ago  presenting with encephalopathy, CVA.  Wife reports pt with worsening confusion, slurred speech, difficulty walking over past 3-4 days. Pt works as a Environmental education officer. Has had difficulty with this recently. Denies any recent falls. No recent seizure activity. States that this has been well controlled.  Presented to the ER afebrile, hemodynamically stable. BP 120s-130s. Satting >97% on RA. WBC mildly elevated at 10.7. hgb 13.1, Cr 0.84. CXR WNL. Head CT with age related changes, but no acute findings. Wife also reports pt behavior change. Noted to be uncooperative with nurses in ER. EKG NSR. Trop neg x 1. Pro BNP @ 953.   Assessment and Plan: Zachary Ali is a 80 y.o. year old male presenting with encephalopathy, CVA.  Active Problems:   Altered mental state   CVA (cerebral infarction)   1-Encephalopathy/CVA  -proceed down stroke pathway including MRI, MRA, 2D ECHO, carotid dopplers, risk stratification labs -neuro consulted-appreciate input.  -check UA  -check ammonia level   2-Seizure d/o  -well controlled per report  -f/u neuro recs   3-hx/o SVT  -Rate WNL  -BP stable  -cont BB    FEN/GI: NPO pending bedside swallow eval  Prophylaxis: lovenox  Disposition: pending further evaluation  Code Status:Full Code    Patient Active Problem List   Diagnosis Date Noted  . Altered mental state 06/25/2014  . CVA (cerebral infarction) 06/25/2014  . Generalized nonconvulsive epilepsy without mention of intractable epilepsy 08/25/2013   Past Medical History: Past Medical History   Diagnosis Date  . Seizure     since childhood, Dr. Debarah Crape; off phenoparb since 2011, after many yr, doing ok  . Small vessel disease   . Allergic rhinitis   . BPH (benign prostatic hypertrophy)     seen urology, Dr. Earlene Plater  . SVT (supraventricular tachycardia)   . Elevated PSA     dr. Earlene Plater PSA 3-6; no PSA-urology f/u for exam yrly  . Mild depression   . Dyslipidemia     diet control  . Edema leg     hx of  . Insomnia   . OA (osteoarthritis) of knee     left   . Bronchitis     episode of 2/10  . SOB (shortness of breath)   . Palpitations     w/u with Card-Dr. Anne Fu  . Paroxysmal supraventricular tachycardia     Past Surgical History: Past Surgical History  Procedure Laterality Date  . Vasectomy    . US echocardiography      ok; stress test ok-05-2013 nl LVF, tr AR  . Hemorrhoid surgery    . Cataract extraction      2011-12    Social History: History   Social History  . Marital Status: Married    Spouse Name: Hope    Number of Children: 0  . Years of Education: Automotive engineer   Social History Main Topics  . Smoking status: Former Games developer  . Smokeless tobacco: Never Used  . Alcohol Use: 3.0 oz/week    5 Glasses of wine per week     Comment: weekly  . Drug Use: No  . Sexual Activity: None  Other Topics Concern  . None   Social History Narrative   Patient lives at home with wife [place].    Patient has no children.    Patient is college graduated.    Patient is retired.    Patient is right handed.           Family History: Family History  Problem Relation Age of Onset  . Heart failure    . Heart disease Mother   . Coronary artery disease Mother   . Coronary artery disease Father   . Heart disease Father     Allergies: No Known Allergies  Current Facility-Administered Medications  Medication Dose Route Frequency Provider Last Rate Last Dose  .  stroke: mapping our early stages of recovery book   Does not apply Once Doree Albee, MD      . divalproex  (DEPAKOTE) DR tablet 500 mg  500 mg Oral TID Doree Albee, MD      . enoxaparin (LOVENOX) injection 30 mg  30 mg Subcutaneous Q24H Doree Albee, MD      . Melene Muller ON 06/26/2014] metoprolol succinate (TOPROL-XL) 24 hr tablet 25 mg  25 mg Oral Daily Doree Albee, MD      . phenytoin (DILANTIN) ER capsule 100 mg  100 mg Oral TID Doree Albee, MD      . senna-docusate (Senokot-S) tablet 1 tablet  1 tablet Oral QHS PRN Doree Albee, MD       Current Outpatient Prescriptions  Medication Sig Dispense Refill  . divalproex (DEPAKOTE) 500 MG DR tablet Take 500 mg by mouth 3 (three) times daily.      . metoprolol succinate (TOPROL-XL) 25 MG 24 hr tablet Take 25 mg by mouth daily.      . phenytoin (DILANTIN) 100 MG ER capsule Take 1 capsule (100 mg total) by mouth 3 (three) times daily.  90 capsule  8   Review Of Systems: 12 point ROS negative except as noted above in HPI.  Physical Exam: Filed Vitals:   06/25/14 2036  BP: 138/55  Pulse: 80  Temp:   Resp: 21    General: cooperative and confused  HEENT: PERRLA and extra ocular movement intact Heart: S1, S2 normal, no murmur, rub or gallop, regular rate and rhythm Lungs: clear to auscultation, no wheezes or rales and unlabored breathing Abdomen: abdomen is soft without significant tenderness, masses, organomegaly or guarding Extremities: 2+ peripheral pulses, 1-2 + edema bilaterally  Skin:no rashes Neurology: no focal motor-sensory deficits, oriented only to place x 1  Labs and Imaging: Lab Results  Component Value Date/Time   NA 137 06/25/2014  7:01 PM   NA 142 08/26/2013  9:05 AM   K 3.9 06/25/2014  7:01 PM   CL 103 06/25/2014  7:01 PM   CO2 21 06/25/2014  7:01 PM   BUN 22 06/25/2014  7:01 PM   BUN 21 08/26/2013  9:05 AM   CREATININE 0.84 06/25/2014  7:01 PM   GLUCOSE 96 06/25/2014  7:01 PM   GLUCOSE 90 08/26/2013  9:05 AM   Lab Results  Component Value Date   WBC 10.7* 06/25/2014   HGB 13.1 06/25/2014   HCT 39.1 06/25/2014   MCV 97.3  06/25/2014   PLT 89* 06/25/2014    Dg Chest 2 View  06/25/2014   CLINICAL DATA:  Cough.  EXAM: CHEST  2 VIEW  COMPARISON:  04/08/2013.  FINDINGS: The cardiac silhouette, mediastinal and hilar contours are within normal limits and stable. There is tortuosity  and calcification of the thoracic aorta. The lungs are clear. No pleural effusion. The bony thorax is intact.  IMPRESSION: No acute cardiopulmonary findings.   Electronically Signed   By: Loralie Champagne M.D.   On: 06/25/2014 19:14   Ct Head Wo Contrast  06/25/2014   CLINICAL DATA:  Slurred speech and difficulty walking.  EXAM: CT HEAD WITHOUT CONTRAST  TECHNIQUE: Contiguous axial images were obtained from the base of the skull through the vertex without intravenous contrast.  COMPARISON:  06/24/2006.  FINDINGS: Stable age related cerebral atrophy, ventriculomegaly and periventricular white matter disease. 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 No extra-axial fluid collections are identified. No CT findings for acute hemispheric infarction or intracranial hemorrhage. No mass lesions. The brainstem and cerebellum are normal.  The bony structures are intact. The paranasal sinuses and mastoid air cells are clear. The globes are intact.  IMPRESSION: Age related cerebral atrophy, ventriculomegaly and periventricular white matter disease.  No acute intracranial findings or mass lesions.   Electronically Signed   By: Loralie Champagne M.D.   On: 06/25/2014 18:28           Doree Albee MD  Pager: [phone]

## 2014-06-25 NOTE — Progress Notes (Signed)
Patients spouse brought to floor from E.D

## 2014-06-26 DIAGNOSIS — N390 Urinary tract infection, site not specified: Principal | ICD-10-CM

## 2014-06-26 DIAGNOSIS — G459 Transient cerebral ischemic attack, unspecified: Secondary | ICD-10-CM

## 2014-06-26 DIAGNOSIS — G40909 Epilepsy, unspecified, not intractable, without status epilepticus: Secondary | ICD-10-CM

## 2014-06-26 DIAGNOSIS — R799 Abnormal finding of blood chemistry, unspecified: Secondary | ICD-10-CM

## 2014-06-26 LAB — URINE MICROSCOPIC-ADD ON

## 2014-06-26 LAB — CBC
HCT: 37.5 % — ABNORMAL LOW (ref 39.0–52.0)
HEMOGLOBIN: 13 g/dL (ref 13.0–17.0)
MCH: 33 pg (ref 26.0–34.0)
MCHC: 34.7 g/dL (ref 30.0–36.0)
MCV: 95.2 fL (ref 78.0–100.0)
Platelets: 76 10*3/uL — ABNORMAL LOW (ref 150–400)
RBC: 3.94 MIL/uL — ABNORMAL LOW (ref 4.22–5.81)
RDW: 14.3 % (ref 11.5–15.5)
WBC: 9.7 10*3/uL (ref 4.0–10.5)

## 2014-06-26 LAB — CREATININE, SERUM
Creatinine, Ser: 0.81 mg/dL (ref 0.50–1.35)
GFR calc Af Amer: >90 mL/min (ref >90)
GFR, EST NON AFRICAN AMERICAN: 80 mL/min — AB (ref >90)

## 2014-06-26 LAB — URINALYSIS, ROUTINE W REFLEX MICROSCOPIC
Bilirubin Urine: NEGATIVE
Glucose, UA: NEGATIVE mg/dL
Ketones, ur: 15 mg/dL — AB
Nitrite: NEGATIVE
PROTEIN: 30 mg/dL — AB
Specific Gravity, Urine: 1.02 (ref 1.005–1.030)
UROBILINOGEN UA: 1 mg/dL (ref 0.0–1.0)
pH: 6 (ref 5.0–8.0)

## 2014-06-26 LAB — LIPID PANEL
Cholesterol: 142 mg/dL (ref 0–200)
HDL: 55 mg/dL (ref >39)
LDL Cholesterol: 66 mg/dL (ref 0–99)
Total CHOL/HDL Ratio: 2.6 RATIO
Triglycerides: 106 mg/dL (ref <150)
VLDL: 21 mg/dL (ref 0–40)

## 2014-06-26 LAB — VALPROIC ACID LEVEL: Valproic Acid Lvl: 33.1 ug/mL — ABNORMAL LOW (ref 50.0–100.0)

## 2014-06-26 LAB — HEMOGLOBIN A1C
Hgb A1c MFr Bld: 5.8 % — ABNORMAL HIGH (ref <5.7)
Mean Plasma Glucose: 120 mg/dL — ABNORMAL HIGH (ref <117)

## 2014-06-26 LAB — PHENYTOIN LEVEL, TOTAL: Phenytoin Lvl: 8 ug/mL — ABNORMAL LOW (ref 10.0–20.0)

## 2014-06-26 LAB — AMMONIA: AMMONIA: 28 umol/L (ref 11–60)

## 2014-06-26 MED ORDER — DIVALPROEX SODIUM 250 MG PO DR TAB
750.0000 mg | DELAYED_RELEASE_TABLET | Freq: Once | ORAL | Status: AC
  Administered 2014-06-26: 750 mg via ORAL
  Filled 2014-06-26: qty 1

## 2014-06-26 MED ORDER — DEXTROSE 5 % IV SOLN
1.0000 g | INTRAVENOUS | Status: DC
  Administered 2014-06-26 – 2014-06-29 (×4): 1 g via INTRAVENOUS
  Filled 2014-06-26 (×4): qty 10

## 2014-06-26 MED ORDER — PNEUMOCOCCAL VAC POLYVALENT 25 MCG/0.5ML IJ INJ
0.5000 mL | INJECTION | INTRAMUSCULAR | Status: AC
  Administered 2014-06-27: 0.5 mL via INTRAMUSCULAR

## 2014-06-26 MED ORDER — PHENYTOIN 50 MG PO CHEW
400.0000 mg | CHEWABLE_TABLET | Freq: Once | ORAL | Status: AC
  Administered 2014-06-26: 400 mg via ORAL
  Filled 2014-06-26: qty 8

## 2014-06-26 MED ORDER — ASPIRIN EC 81 MG PO TBEC
81.0000 mg | DELAYED_RELEASE_TABLET | Freq: Every day | ORAL | Status: DC
  Administered 2014-06-27 – 2014-06-29 (×3): 81 mg via ORAL
  Filled 2014-06-26 (×3): qty 1

## 2014-06-26 NOTE — Progress Notes (Signed)
  Echocardiogram 2D Echocardiogram has been performed.  Georgian Co 06/26/2014, 3:46 PM

## 2014-06-26 NOTE — Progress Notes (Signed)
ANTIBIOTIC CONSULT NOTE - INITIAL  Pharmacy Consult for rocephin Indication: UTI  No Known Allergies  Patient Measurements: Height:  (172.7 cm) Weight: 171 lb 8.3 oz (77.8 kg) IBW/kg (Calculated) : 68.4  Vital Signs: Temp: 98.8 F (37.1 C) (09/12 1337) Temp src: Oral (09/12 1337) BP: 117/51 mmHg (09/12 1337) Pulse Rate: 73 (09/12 1337) Intake/Output from previous day:   Intake/Output from this shift:    Labs:  Recent Labs  06/25/14 1901 06/25/14 2348  WBC 10.7* 9.7  HGB 13.1 13.0  PLT 89* 76*  CREATININE 0.84 0.81   Estimated Creatinine Clearance: 66.9 ml/min (by C-G formula based on Cr of 0.81). No results found for this basename: VANCOTROUGH, VANCOPEAK, VANCORANDOM, GENTTROUGH, GENTPEAK, GENTRANDOM, TOBRATROUGH, TOBRAPEAK, TOBRARND, AMIKACINPEAK, AMIKACINTROU, AMIKACIN,  in the last 72 hours   Microbiology: No results found for this or any previous visit (from the past 720 hour(s)).  Medical History: Past Medical History  Diagnosis Date  . Seizure     since childhood, Dr. Debarah Crape; off phenoparb since 2011, after many yr, doing ok  . Small vessel disease   . Allergic rhinitis   . BPH (benign prostatic hypertrophy)     seen urology, Dr. Earlene Plater  . SVT (supraventricular tachycardia)   . Elevated PSA     dr. Earlene Plater PSA 3-6; no PSA-urology f/u for exam yrly  . Mild depression   . Dyslipidemia     diet control  . Edema leg     hx of  . Insomnia   . OA (osteoarthritis) of knee     left   . Bronchitis     episode of 2/10  . SOB (shortness of breath)   . Palpitations     w/u with Card-Dr. Anne Fu  . Paroxysmal supraventricular tachycardia     Medications:  Scheduled:  .  stroke: mapping our early stages of recovery book   Does not apply Once  . cefTRIAXone (ROCEPHIN)  IV  1 g Intravenous Q24H  . divalproex  500 mg Oral TID  . enoxaparin (LOVENOX) injection  30 mg Subcutaneous Q24H  . metoprolol succinate  25 mg Oral Daily  . phenytoin  100 mg Oral TID   . [START ON 06/27/2014] pneumococcal 23 valent vaccine  0.5 mL Intramuscular Tomorrow-1000   Assessment: 80 yo male with AMS and UA concerning for UTI to start rocephin. WBC= 9.7, afebrile, and UA noted with WBC and large leukocytes.   9/12 rocephin>>  9/12 urine   Plan:  -Rocephin 1gm IV q24h -Will follow cultures and clinical progress  Harland German, Pharm D 06/26/2014 1:45 PM

## 2014-06-26 NOTE — Progress Notes (Signed)
STROKE TEAM PROGRESS NOTE   HISTORY HPI: Zachary Ali is an 80 y.o. 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 He noted at 9PM that he was having dizziness and also noted that he was having difficulty playing the piece. He was able to drive home after practice and his wife noted that he was having difficulty with speech. He slept last evening and on awakening his symptoms were still present but did improve in the morning. After eating breakfast they recurred and have continued. Had difficulty with gait and needed to hold onto his wife for ambulation. Patient presented for evaluation at her request.  Date last known well: Date: 06/24/2014  Time last known well: Time: 21:00  tPA Given: No: Outside time window  SUBJECTIVE (INTERVAL HISTORY) No one is at the bedside.  Overall he feels his condition is rapidly improving. He had hx of seizure, but not able to tell me what his seizures look like. He said his last seizure was 4 years ago. His seizure was much better controlled after adding depakote to his dilantin.    OBJECTIVE Temp:  [97.5 F (36.4 C)-99.1 F (37.3 C)] 97.5 F (36.4 C) (09/12 2227) Pulse Rate:  [73-83] 81 (09/12 2227) Cardiac Rhythm:  [-] Normal sinus rhythm (09/11 2300) Resp:  [18-20] 18 (09/12 2227) BP: (117-151)/(50-87) 151/57 mmHg (09/12 2227) SpO2:  [97 %-100 %] 99 % (09/12 2227) Weight:  [171 lb 8.3 oz (77.8 kg)] 171 lb 8.3 oz (77.8 kg) (09/11 2300)  No results found for this basename: GLUCAP,  in the last 168 hours  Recent Labs Lab 06/25/14 1901 06/25/14 2348  NA 137  --   K 3.9  --   CL 103  --   CO2 21  --   GLUCOSE 96  --   BUN 22  --   CREATININE 0.84 0.81  CALCIUM 8.6  --     Recent Labs Lab 06/25/14 1901  AST 23  ALT 17  ALKPHOS 61  BILITOT 0.3  PROT 6.0  ALBUMIN 2.8*    Recent Labs Lab 06/25/14 1901 06/25/14 2348  WBC 10.7* 9.7  NEUTROABS 7.6  --   HGB 13.1 13.0  HCT 39.1 37.5*  MCV 97.3 95.2  PLT 89*  76*    Recent Labs Lab 06/25/14 1902  TROPONINI <0.30   No results found for this basename: LABPROT, INR,  in the last 72 hours  Recent Labs  06/26/14 1129  COLORURINE AMBER*  LABSPEC 1.020  PHURINE 6.0  GLUCOSEU NEGATIVE  HGBUR MODERATE*  BILIRUBINUR NEGATIVE  KETONESUR 15*  PROTEINUR 30*  UROBILINOGEN 1.0  NITRITE NEGATIVE  LEUKOCYTESUR LARGE*       Component Value Date/Time   CHOL 142 06/26/2014 0605   TRIG 106 06/26/2014 0605   HDL 55 06/26/2014 0605   CHOLHDL 2.6 06/26/2014 0605   VLDL 21 06/26/2014 0605   LDLCALC 66 06/26/2014 0605   Lab Results  Component Value Date   HGBA1C 5.8* 06/26/2014   No results found for this basename: labopia, cocainscrnur, labbenz, amphetmu, thcu, labbarb    No results found for this basename: ETH,  in the last 168 hours  Dg Chest 2 View  06/25/2014    IMPRESSION: No acute cardiopulmonary findings.     Ct Head Wo Contrast  06/25/2014   IMPRESSION: Age related cerebral atrophy, ventriculomegaly and periventricular white matter disease.  No acute intracranial findings or mass lesions.      Mr Brain  Wo Contrast  06/25/2014   IMPRESSION: MRI HEAD IMPRESSION:  1. No acute intracranial infarct or other abnormality identified. 2. Moderate generalized cerebral atrophy.    MRA HEAD   IMPRESSION:  1. Severe short-segment stenosis within the mid -distal right M1 segment. No other hemodynamically significant stenosis or proximal branch occlusion identified within the intracranial circulation. 2. Moderate multi focal atherosclerotic irregularity within the cavernous carotid arteries and middle cerebral arteries bilaterally. 3. Fetal origin of the left PCA.   Electronically Signed   By: Rise Mu M.D.   On: 06/25/2014 23:43   CUS - Bilateral: 1-39% ICA stenosis. Vertebral artery flow is antegrade.  2D echo - - Left ventricle: The cavity size was normal. Systolic function was vigorous. The estimated ejection fraction was in the range  of 65% to 70%. Wall motion was normal; there were no regional wall motion abnormalities. Doppler parameters are consistent with abnormal left ventricular relaxation (grade 1 diastolic dysfunction). Doppler parameters are consistent with high ventricular filling pressure. - Right ventricle: The cavity size was moderately dilated. Wall thickness was normal. Systolic function was mildly reduced. - Right atrium: The atrium was moderately dilated. Impressions: - No cardiac source of emboli was indentified.  EEG pending   PHYSICAL EXAM  Temp:  [97.5 F (36.4 C)-99.1 F (37.3 C)] 97.5 F (36.4 C) (09/12 2227) Pulse Rate:  [73-83] 81 (09/12 2227) Resp:  [18-20] 18 (09/12 2227) BP: (117-151)/(50-87) 151/57 mmHg (09/12 2227) SpO2:  [97 %-100 %] 99 % (09/12 2227) Weight:  [171 lb 8.3 oz (77.8 kg)] 171 lb 8.3 oz (77.8 kg) (09/11 2300)  General - Well nourished, well developed, in no apparent distress.  Ophthalmologic - Sharp disc margins OU.  Cardiovascular - Regular rate and rhythm with no murmur.  Mental Status -  Level of arousal and orientation to place, and person were intact, but not to time. Language including expression, naming, repetition, comprehension was assessed and found intact.  Cranial Nerves II - XII - II - Visual field intact OU. III, IV, VI - Extraocular movements intact. V - Facial sensation intact bilaterally. VII - Facial movement intact bilaterally. VIII - Hearing & vestibular intact bilaterally. X - Palate elevates symmetrically. XI - Chin turning & shoulder shrug intact bilaterally. XII - Tongue protrusion intact.  Motor Strength - The patient's strength was normal in all extremities and pronator drift was absent.  Bulk was normal and fasciculations were absent.   Motor Tone - Muscle tone was assessed at the neck and appendages and was normal.  Reflexes - The patient's reflexes were normal in all extremities and he had no pathological  reflexes.  Sensory - Light touch, temperature/pinprick, vibration and proprioception, and Romberg testing were assessed and were normal.    Coordination - The patient had normal movements in the hands and feet with no ataxia or dysmetria.  Tremor was absent.  Gait and Station - not tested due to safety.   ASSESSMENT/PLAN  Zachary Ali is a 80 y.o. male with history of seizure and SVT with dizziness, wobbly walking and speech slurry. He did not receive IV t-PA due to out of window. Imaging did not show stroke but showed right M1 stenosis. Stroke work up underway.  TIA vs. Seizure vs. Encephalopathy due to UTI:       no antiplatelet  prior to admission, now on ASA  given  MRI  No acute infarct  MRA  Right M1 stenosis  Carotid Doppler  pending  LDL 66,  no statin necessary as meeting goal of LDL <100  HgbA1c 5.8 WNL  lovenox for VTE prophylaxis  Cardiac diet   Activity as tolerated  Seizure - off phenobarbital 2011 - on depakote and dilantin - levels are both low - depakote and dilantin loading dose will be given - repeat morning levels - EEG pending - UTI lowers seizure threshold  UTI - UA confirmed  - on rocephine - treatment as per primary team  Hypertension   Home meds:  Metoprolol. Resumed in hospital  BP 120-150 past 24h (06/26/2014 @ 10:37 PM)  SBP goal <140/90  Stable  Patient counseled to be compliant with his blood pressure medications  Other Stroke Risk Factors Hx of SVT - predictive factor for afib Recommend cardionet event monitoring for 30 days as outpt to rule out afib which will change the treatment plan.    Hospital day # 1  Marvel Plan, MD PhD Stroke Neurology 06/26/2014 10:55 PM   To contact Stroke Continuity provider, please refer to [email or web address]. After hours, contact General Neurology

## 2014-06-26 NOTE — Progress Notes (Signed)
PT Cancellation Note  Patient Details Name: RAFAL ARCHULETA MRN: [id number] DOB: 1929-01-21   Cancelled Treatment:    Reason Eval/Treat Not Completed: Medical issues which prohibited therapy. Pt on bedrest at this time. Will re-attempt to evaluate pt when medically appropriate.    Donnamarie Poag [place], [place]  [phone] 06/26/2014, 9:13 AM

## 2014-06-26 NOTE — Progress Notes (Signed)
Occupational Therapy Evaluation Patient Details Name: Zachary Ali MRN: [id number] DOB: [birth year] Today's Date: 06/26/2014    History of Present Illness 80 y.o. male presenting with difficulty with speech and a mild right hemiparesis.  Suspect and acute left brain ischemic event.  Head CT reviewed and unremarkable.  Patient presented outside of the time window and therefore not a tPA candidate.   MRI also negative. Further workup underway.   Clinical Impression   PTA, pt independent with mobility and ADL, drove and played in an orchestra. Spoke with wife over the phone who confirmed that this is NOT patient's baseline status. Pt currently requires Min A with mobility and assistance with ADL due to balance and apparent cognitive deficits noted below. If pt's mental status improves, he will be able to D/C home with HHOT/PT. If patient continues to demonstrate altered mental status, he may need ST rehab at SNF to safely D/C home. Wife will not be able to physically assist as she is currently dealing with medical issues herself. Will plan to see again in am. Pt not safe to D/C home today.     Follow Up Recommendations  Home health OT;Supervision/Assistance - 24 hour;Other (comment) (Only if mental status improves.) Otherwise may need SNF   Equipment Recommendations  Tub/shower bench    Recommendations for Other Services       Precautions / Restrictions Precautions Precautions: Fall Precaution Comments: denies any falls Restrictions Weight Bearing Restrictions: No      Mobility Bed Mobility Up in chair  Transfers Overall transfer level: Needs assistance Equipment used: 1 person hand held assist Transfers: Sit to/from Stand;Stand Pivot Transfers Sit to Stand: Min assist Stand pivot transfers: Min assist       General transfer comment: posterior lean    Balance Overall balance assessment: Needs assistance Sitting-balance support: Feet supported Sitting balance-Leahy Scale:  Fair  Postural control: Posterior lean Standing balance support: During functional activity Standing balance-Leahy Scale: Poor Standing balance comment: posterior lean                            ADL Overall ADL's : Needs assistance/impaired Eating/Feeding: Modified independent   Grooming: Set up   Upper Body Bathing: Set up;Sitting   Lower Body Bathing: Minimal assistance;Sit to/from stand   Upper Body Dressing : Set up;Sitting   Lower Body Dressing: Moderate assistance;Sit to/from stand (for balance)   Toilet Transfer: Minimal assistance     Toileting - Clothing Manipulation Details (indicate cue type and reason): foley     Functional mobility during ADLs: Minimal assistance General ADL Comments: Pt withpoor awareness of deficits, including balance deficits. Highfall risk     Vision                     Perception     Praxis      Pertinent Vitals/Pain Pain Assessment: Faces Faces Pain Scale: Hurts whole lot Pain Location: with urination Pain Descriptors / Indicators: Burning Pain Intervention(s): Limited activity within patient's tolerance;Other (comment) (RN notified)     Hand Dominance Right   Extremity/Trunk Assessment Upper Extremity Assessment Upper Extremity Assessment: Overall WFL for tasks assessed   Lower Extremity Assessment Lower Extremity Assessment: Defer to PT evaluation   Cervical / Trunk Assessment Cervical / Trunk Assessment: Kyphotic Cervical / Trunk Exceptions: rounded shoulders; fwd head with incr cervical rotation to Rt and sidebend Lt   Communication Communication Communication: Expressive difficulties;Other (comment) (demonstratee word finding deficits  at times?baseline)   Cognition Arousal/Alertness: Awake/alert Behavior During Therapy: Impulsive;Restless Overall Cognitive Status: Impaired/Different from baseline Area of Impairment: Orientation;Attention;Memory;Safety/judgement;Awareness Orientation Level:  Disoriented to;Time (unable to state year) Current Attention Level: Selective Memory: Decreased short-term memory Following Commands: Follows one step commands with increased time Safety/Judgement: Decreased awareness of safety;Decreased awareness of deficits Awareness: Emergent Problem Solving: Slow processing;Difficulty sequencing;Requires verbal cues General Comments: confabulating; difficulty staying on task; very animated. Discussed with wife, who states that pt is NOT as his cogntiive baseline   General Comments       Exercises       Shoulder Instructions      Home Living Family/patient expects to be discharged to:: Private residence Living Arrangements: Spouse/significant other Available Help at Discharge: Family;Available 24 hours/day Type of Home: House Home Access: Stairs to enter [employer] of Steps: 3 Entrance Stairs-Rails: Can reach both;Left;Right Home Layout: One level     Bathroom Shower/Tub: Walk-in shower;Tub/shower unit   Teacher, early years/pre: Yes How Accessible: Accessible via walker (tight fit) Home Equipment: Cane - single point   Additional Comments: pt has walk in shower with seat but does not like to use it for fear of falling      Prior Functioning/Environment Level of Independence: Independent        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    OT Diagnosis: Generalized weakness;Cognitive deficits;Altered mental status;Acute pain   OT Problem List: Decreased strength;Decreased activity tolerance;Decreased cognition;Decreased safety awareness;Pain   OT Treatment/Interventions: Self-care/ADL training;Therapeutic exercise;DME and/or AE instruction;Therapeutic activities;Cognitive remediation/compensation;Patient/family education;Balance training    OT Goals(Current goals can be found in the care plan section) Acute Rehab OT Goals Patient Stated Goal: to take another walk OT Goal Formulation: With  patient/family Time For Goal Achievement: 07/10/14 Potential to Achieve Goals: Good  OT Frequency: Min 2X/week   Barriers to D/C: Decreased caregiver support (elderly wife has breast cancer)          Co-evaluation              End of Session Equipment Utilized During Treatment: Gait belt Nurse Communication: Mobility status  Activity Tolerance: Patient tolerated treatment well Patient left: in chair;with call bell/phone within reach;with chair alarm set   Time: 1030-1055 OT Time Calculation (min): 25 min Charges:  OT General Charges $OT Visit: 1 Procedure OT Evaluation $Initial OT Evaluation Tier I: 1 Procedure OT Treatments $Self Care/Home Management : 8-22 mins G-Codes:    Narcissus Detwiler,H[place] 08-Jul-2014, 11:36 AM   Luisa Dago, OTR/L  [phone] 07/08/2014

## 2014-06-26 NOTE — Progress Notes (Signed)
[facility]                                                                              Patient Demographics  Zachary Ali, is a 80 y.o. male, DOB - 09-18-1929, ZOX:[id number]  Admit date - 06/25/2014   Admitting Physician Doree Albee, MD  Outpatient Primary MD for the patient is Georgann Housekeeper, MD  LOS - 1   Chief Complaint  Patient presents with  . Altered Mental Status      HPI on 06/25/2014 This is a 80 y.o. year old male with significant past medical history of seizure disorder, hx/o SVT, ? Cerebral aneurysm 50-60 years ago who presented with encephalopathy, CVA. Wife reported that patient had worsening confusion, slurred speech, difficulty walking over past 3-4 days. Patient works as a Environmental education officer. Denied any recent falls. No recent seizure activity. Stated that this has been well controlled.  Presented to the ER afebrile, hemodynamically stable. BP 120s-130s. Satting >97% on RA. WBC mildly elevated at 10.7. hgb 13.1, Cr 0.84. CXR WNL. Head CT with age related changes, but no acute findings. Wife also reported behavior change. Noted to be uncooperative with nurses in ER. EKG NSR. Trop neg x 1. Pro BNP @ 953.    Assessment & Plan   Acute Encephalopathy -Possibly secondary to Questionable TIA vs CVA versus infectious etiology -CT of the head: No acute intracranial findings or mass lesions. -MRI of the brain:No acute intracranial infarct or other abnormality identified. -Echocardiogram pending -Carotid Doppler: 1-39% ICA stenosis. Vertebral artery flow is antegrade.  -LDL 66,  A1c pending -Neurology consulted and appreciated, recommend a 30 day event monitor -Will speak to cardiology regarding the monitor -PT consulted, recommended home health physical therapy -Possibly secondary to UTI -Chest x-ray negative, ammonia 20  Urinary tract infection -UA shows WBC TNTC, large leukocytes -Pending urine culture -Will start patient on ceftriaxone  History of seizure  disorder -Supposedly controlled -Will check Dilantin and Depakote level -Will order EEG -Continue Dilantin and Depakote along with seizure precautions  History of SVT -Currently in sinus rhythm, continue telemetry monitoring -Continue  -Neurology recommended 30 day event monitor  Elevated BNP -Patient does have some edema in the lower extremities -No known history of congestive heart failure -Will obtain echocardiogram -Will monitor daily weights, intake and output  Code Status: Full  Family Communication: None at bedside  Disposition Plan: Admitted  Time Spent in minutes   30 minutes  Procedures  Carotid Doppler: 1-39% ICA stenosis. Vertebral artery flow is antegrade.   Consults   Neurology  DVT Prophylaxis  Lovenox  Lab Results  Component Value Date   PLT 76* 06/25/2014    Medications  Scheduled Meds: .  stroke: mapping our early stages of recovery book   Does not apply Once  . divalproex  500 mg Oral TID  . enoxaparin (LOVENOX) injection  30 mg Subcutaneous Q24H  . metoprolol succinate  25 mg Oral Daily  . phenytoin  100 mg Oral TID  . [START ON 06/27/2014] pneumococcal 23 valent vaccine  0.5 mL Intramuscular Tomorrow-1000   Continuous Infusions:  PRN Meds:.senna-docusate  Antibiotics    Anti-infectives   None  Subjective:   Zachary Ali seen and examined today. Patient states he is worried as he feels different. Currently denies any chest pain, palpitations, dizziness, headache, abdominal pain, shortness of breath.    Objective:   Filed Vitals:   06/26/14 0100 06/26/14 0300 06/26/14 0500 06/26/14 1050  BP: 138/52 151/87 150/78 128/58  Pulse: 83  80 76  Temp: 98.6 F (37 C) 99.1 F (37.3 C) 98.1 F (36.7 C) 97.6 F (36.4 C)  TempSrc:  Oral Oral Oral  Resp: Height:      Weight:      SpO2: 99% 97% 99% 97%    Wt Readings from Last 3 Encounters:  06/25/14 77.8 kg (171 lb 8.3 oz)  08/25/13 91.173 kg (201 lb)    No  intake or output data in the 24 hours ending 06/26/14 1306  Exam  General: Well developed, well nourished, NAD, appears stated age  HEENT: NCAT, PERRLA, EOMI, Anicteic Sclera, mucous membranes moist.   Cardiovascular: S1 S2 auscultated, no rubs, murmurs or gallops. Regular rate and rhythm.  Respiratory: Clear to auscultation bilaterally with equal chest rise  Abdomen: Soft, nontender, nondistended, + bowel sounds  Extremities: warm dry without cyanosis clubbing. + Edema  Neuro: AAOx2, cranial nerves grossly intact. Strength equal bilaterally upper/lower extremities  Skin: Without rashes exudates or nodules  Psych: Normal affect and demeanor with intact judgement and insight  Data Review   Micro Results No results found for this or any previous visit (from the past 240 hour(s)).  Radiology Reports Dg Chest 2 View  06/25/2014   CLINICAL DATA:  Cough.  EXAM: CHEST  2 VIEW  COMPARISON:  04/08/2013.  FINDINGS: The cardiac silhouette, mediastinal and hilar contours are within normal limits and stable. There is tortuosity and calcification of the thoracic aorta. The lungs are clear. No pleural effusion. The bony thorax is intact.  IMPRESSION: No acute cardiopulmonary findings.   Electronically Signed   By: Loralie Champagne M.D.   On: 06/25/2014 19:14   Ct Head Wo Contrast  06/25/2014   CLINICAL DATA:  Slurred speech and difficulty walking.  EXAM: CT HEAD WITHOUT CONTRAST  TECHNIQUE: Contiguous axial images were obtained from the base of the skull through the vertex without intravenous contrast.  COMPARISON:  06/24/2006.  FINDINGS: Stable age related cerebral atrophy, ventriculomegaly and periventricular white matter disease. 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 No extra-axial fluid collections are identified. No CT findings for acute hemispheric infarction or intracranial hemorrhage. No mass lesions. The brainstem and cerebellum are normal.  The bony structures are  intact. The paranasal sinuses and mastoid air cells are clear. The globes are intact.  IMPRESSION: Age related cerebral atrophy, ventriculomegaly and periventricular white matter disease.  No acute intracranial findings or mass lesions.   Electronically Signed   By: Loralie Champagne M.D.   On: 06/25/2014 18:28   Mr Brain Wo Contrast  06/25/2014   CLINICAL DATA:  Mild right hemi paresis, speech difficulty.  EXAM: MRI HEAD WITHOUT CONTRAST  MRA HEAD WITHOUT CONTRAST  TECHNIQUE: Multiplanar, multiecho pulse sequences of the brain and surrounding structures were obtained without intravenous contrast. Angiographic images of the head were obtained using MRA technique without contrast.  COMPARISON:  None.  Prior CT from earlier the same day.  FINDINGS: MRI HEAD FINDINGS  Diffuse prominence of the CSF containing spaces is compatible with age-related generalized cerebral atrophy. Minimal T2/FLAIR hyperintensity seen within the periventricular and deep white matter, within normal limits for  patient age.  No diffusion-weighted signal abnormality to suggest acute intracranial infarct. Gray-white matter differentiation well maintained. Normal flow voids seen within the intracranial vasculature. There is no intracranial hemorrhage. Few punctate chronic micro hemorrhages noted within the left frontal lobe, which may be related to underlying hypertension.  No mass lesion or midline shift. Mild ventricular prominence related at global atrophy present without hydrocephalus. No extra-axial fluid collection.  Craniocervical junction widely patent. Pituitary gland within normal limits. No acute abnormality seen about either orbit. Lens replacement noted at the left orbit.  Calvarium and scalp soft tissues demonstrate a normal appearance. Mild multilevel degenerative spondylolysis noted within the upper cervical spine.  Paranasal sinuses and mastoid air cells are clear.  MRA HEAD FINDINGS  ANTERIOR CIRCULATION:  Study is degraded by  motion artifact.  Visualized portions of the distal atherosclerotic irregularity present within the cavernous carotid arteries bilaterally without hemodynamically significant stenosis. Supra clinoid segments well opacified.  Multi focal atherosclerotic irregularity noted within the A1 segments without significant stenosis. Anterior communicating artery and anterior cerebral arteries are within normal limits.  Moderate multi focal atherosclerotic irregularity present within the M1 segments bilaterally. There is a severe stenosis within the distal right M1 segment. Stenosis measures approximately 3 mm in length. The right MCA branches are well opacified distally. Left MCA branches are normal.  No aneurysm within the anterior circulation.  POSTERIOR CIRCULATION:  Vertebral arteries are codominant without significant stenosis or occlusion. Posterior inferior cerebral arteries not well visualized. Vertebrobasilar junction and basilar artery are widely patent with antegrade flow. Proximal superior cerebral arteries are widely patent. Fetal origin of the left PCA noted. Posterior lower cerebral arteries are opacified without hemodynamically significant stenosis.  IMPRESSION: MRI HEAD IMPRESSION:  1. No acute intracranial infarct or other abnormality identified. 2. Moderate generalized cerebral atrophy.  MRA HEAD IMPRESSION:  1. Severe short-segment stenosis within the mid -distal right M1 segment. No other hemodynamically significant stenosis or proximal branch occlusion identified within the intracranial circulation. 2. Moderate multi focal atherosclerotic irregularity within the cavernous carotid arteries and middle cerebral arteries bilaterally. 3. Fetal origin of the left PCA.   Electronically Signed   By: Rise Mu M.D.   On: 06/25/2014 23:43   Mr Maxine Glenn Head/brain Wo Cm  06/25/2014   CLINICAL DATA:  Mild right hemi paresis, speech difficulty.  EXAM: MRI HEAD WITHOUT CONTRAST  MRA HEAD WITHOUT CONTRAST   TECHNIQUE: Multiplanar, multiecho pulse sequences of the brain and surrounding structures were obtained without intravenous contrast. Angiographic images of the head were obtained using MRA technique without contrast.  COMPARISON:  None.  Prior CT from earlier the same day.  FINDINGS: MRI HEAD FINDINGS  Diffuse prominence of the CSF containing spaces is compatible with age-related generalized cerebral atrophy. Minimal T2/FLAIR hyperintensity seen within the periventricular and deep white matter, within normal limits for patient age.  No diffusion-weighted signal abnormality to suggest acute intracranial infarct. Gray-white matter differentiation well maintained. Normal flow voids seen within the intracranial vasculature. There is no intracranial hemorrhage. Few punctate chronic micro hemorrhages noted within the left frontal lobe, which may be related to underlying hypertension.  No mass lesion or midline shift. Mild ventricular prominence related at global atrophy present without hydrocephalus. No extra-axial fluid collection.  Craniocervical junction widely patent. Pituitary gland within normal limits. No acute abnormality seen about either orbit. Lens replacement noted at the left orbit.  Calvarium and scalp soft tissues demonstrate a normal appearance. Mild multilevel degenerative spondylolysis noted within the upper cervical spine.  Paranasal sinuses and mastoid air cells are clear.  MRA HEAD FINDINGS  ANTERIOR CIRCULATION:  Study is degraded by motion artifact.  Visualized portions of the distal atherosclerotic irregularity present within the cavernous carotid arteries bilaterally without hemodynamically significant stenosis. Supra clinoid segments well opacified.  Multi focal atherosclerotic irregularity noted within the A1 segments without significant stenosis. Anterior communicating artery and anterior cerebral arteries are within normal limits.  Moderate multi focal atherosclerotic irregularity present  within the M1 segments bilaterally. There is a severe stenosis within the distal right M1 segment. Stenosis measures approximately 3 mm in length. The right MCA branches are well opacified distally. Left MCA branches are normal.  No aneurysm within the anterior circulation.  POSTERIOR CIRCULATION:  Vertebral arteries are codominant without significant stenosis or occlusion. Posterior inferior cerebral arteries not well visualized. Vertebrobasilar junction and basilar artery are widely patent with antegrade flow. Proximal superior cerebral arteries are widely patent. Fetal origin of the left PCA noted. Posterior lower cerebral arteries are opacified without hemodynamically significant stenosis.  IMPRESSION: MRI HEAD IMPRESSION:  1. No acute intracranial infarct or other abnormality identified. 2. Moderate generalized cerebral atrophy.  MRA HEAD IMPRESSION:  1. Severe short-segment stenosis within the mid -distal right M1 segment. No other hemodynamically significant stenosis or proximal branch occlusion identified within the intracranial circulation. 2. Moderate multi focal atherosclerotic irregularity within the cavernous carotid arteries and middle cerebral arteries bilaterally. 3. Fetal origin of the left PCA.   Electronically Signed   By: Rise Mu M.D.   On: 06/25/2014 23:43    CBC  Recent Labs Lab 06/25/14 1901 06/25/14 2348  WBC 10.7* 9.7  HGB 13.1 13.0  HCT 39.1 37.5*  PLT 89* 76*  MCV 97.3 95.2  MCH 32.6 33.0  MCHC 33.5 34.7  RDW 14.4 14.3  LYMPHSABS 1.4  --   MONOABS 1.7*  --   EOSABS 0.0  --   BASOSABS 0.0  --     Chemistries   Recent Labs Lab 06/25/14 1901 06/25/14 2348  NA 137  --   K 3.9  --   CL 103  --   CO2 21  --   GLUCOSE 96  --   BUN 22  --   CREATININE 0.84 0.81  CALCIUM 8.6  --   AST 23  --   ALT 17  --   ALKPHOS 61  --   BILITOT 0.3  --     ------------------------------------------------------------------------------------------------------------------ estimated creatinine clearance is 66.9 ml/min (by C-G formula based on Cr of 0.81). ------------------------------------------------------------------------------------------------------------------ No results found for this basename: HGBA1C,  in the last 72 hours ------------------------------------------------------------------------------------------------------------------  Recent Labs  06/26/14 0605  CHOL 142  HDL 55  LDLCALC 66  TRIG 106  CHOLHDL 2.6   ------------------------------------------------------------------------------------------------------------------ No results found for this basename: TSH, T4TOTAL, FREET3, T3FREE, THYROIDAB,  in the last 72 hours ------------------------------------------------------------------------------------------------------------------ No results found for this basename: VITAMINB12, FOLATE, FERRITIN, TIBC, IRON, RETICCTPCT,  in the last 72 hours  Coagulation profile No results found for this basename: INR, PROTIME,  in the last 168 hours  No results found for this basename: DDIMER,  in the last 72 hours  Cardiac Enzymes  Recent Labs Lab 06/25/14 1902  TROPONINI <0.30   ------------------------------------------------------------------------------------------------------------------ No components found with this basename: POCBNP,     Tavian Callander D.O. on 06/26/2014 at 1:06 PM  Between 7am to 7pm - Pager - [phone]  After 7pm go to [email or web address] - password [email or web address]  And look for the night coverage person  covering for me after hours  [facility] Office  [phone]

## 2014-06-26 NOTE — Progress Notes (Signed)
SLP Note  Patient Details Name: Zachary Ali MRN: [id number] DOB: March 24, 1929  Orders received for SLE. MRI report indicates "no acute intracranial infarct". Will complete SLE as able.  Zachary Ali, [facility], CCC-SLP [phone]  Zachary Ali 06/26/2014, 3:58 PM

## 2014-06-26 NOTE — Evaluation (Signed)
Physical Therapy Evaluation Patient Details Name: RENNE PLATTS MRN: [id number] DOB: 1929/06/30 Today's Date: 06/26/2014   History of Present Illness  80 y.o. male presenting with difficulty with speech and a mild right hemiparesis.  Suspect and acute left brain ischemic event.  Head CT reviewed and unremarkable.  Patient presented outside of the time window and therefore not a tPA candidate.   MRI also negative. Further workup underway.  Clinical Impression  Pt adm due to above. Pt confused and confabulating throughout session. Spoke with OT who had spoken with wife, confusion and speech difficulties are not his baseline. PTA pt was independent with all ADLs and mobility. Pt presents with decreased balance and decr independence with mobility at this time. Pt to benefit from skilled acute PT to maximize functional mobility prior to D/C home. At this time pt is not safe to D/C home due to AMS and decreased mobility. Will plan to see tomorrow and further assess safety with mobility. MD notified and made aware of increased pain with urination.     Follow Up Recommendations Home health PT;Supervision/Assistance - 24 hour    Equipment Recommendations  Rolling walker with 5" wheels    Recommendations for Other Services OT consult     Precautions / Restrictions Precautions Precautions: Fall Precaution Comments: denies any falls Restrictions Weight Bearing Restrictions: No      Mobility  Bed Mobility Overal bed mobility: Needs Assistance Bed Mobility: Supine to Sit     Supine to sit: Min assist;HOB elevated     General bed mobility comments: pt with difficulty following commands and with hand placement ; required min (A) to elevate trunk   Transfers Overall transfer level: Needs assistance Equipment used: 1 person hand held assist Transfers: Sit to/from [employer] Sit to Stand: Min assist Stand pivot transfers: Min assist       General transfer comment:  posterior lean  Ambulation/Gait             General Gait Details: pivotal steps   Stairs            Wheelchair Mobility    Modified Rankin (Stroke Patients Only) Modified Rankin (Stroke Patients Only) Pre-Morbid Rankin Score: No symptoms Modified Rankin: Moderately severe disability     Balance Overall balance assessment: Needs assistance Sitting-balance support: Feet supported Sitting balance-Leahy Scale: Fair Sitting balance - Comments: pt using bil UEs to brace; pt swaying but denied any dizziness Postural control: Posterior lean Standing balance support: During functional activity Standing balance-Leahy Scale: Poor Standing balance comment: posterior lean                             Pertinent Vitals/Pain Pain Assessment: Faces Faces Pain Scale: Hurts whole lot Pain Location: with urination Pain Descriptors / Indicators: Burning Pain Intervention(s): Limited activity within patient's tolerance;Other (comment) (RN notified)    Home Living Family/patient expects to be discharged to:: Private residence Living Arrangements: Spouse/significant other Available Help at Discharge: Family;Available 24 hours/day Type of Home: House Home Access: Stairs to enter Entrance Stairs-Rails: Can reach both;Left;Right Entrance Stairs-Number of Steps: 3 Home Layout: One level Home Equipment: Cane - single point Additional Comments: pt has walk in shower with seat but does not like to use it for fear of falling    Prior Function Level of Independence: Independent         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     Hand Dominance  Dominant Hand: Right    Extremity/Trunk Assessment   Upper Extremity Assessment: Overall WFL for tasks assessed           Lower Extremity Assessment: Defer to PT evaluation      Cervical / Trunk Assessment: Kyphotic  Communication   Communication: Expressive difficulties;Other (comment) (demonstratee word  finding deficits at times?baseline)  Cognition Arousal/Alertness: Awake/alert Behavior During Therapy: Impulsive;Restless Overall Cognitive Status: Impaired/Different from baseline Area of Impairment: Orientation;Attention;Memory;Safety/judgement;Awareness Orientation Level: Disoriented to;Time (unable to state year) Current Attention Level: Selective Memory: Decreased short-term memory Following Commands: Follows one step commands with increased time Safety/Judgement: Decreased awareness of safety;Decreased awareness of deficits Awareness: Emergent Problem Solving: Slow processing;Difficulty sequencing;Requires verbal cues General Comments: confabulating; difficulty staying on task; very animated. Discussed with wife, who states that pt is NOT as his cogntiive baseline    General Comments General comments (skin integrity, edema, etc.): noted nystagmus with Lt visual tracking; will benefit from further vision assessment by OT    Exercises        Assessment/Plan    PT Assessment Patient needs continued PT services  PT Diagnosis Difficulty walking;Generalized weakness;Acute pain;Altered mental status   PT Problem List Decreased strength;Decreased balance;Decreased activity tolerance;Decreased mobility;Decreased cognition;Decreased knowledge of use of DME;Decreased safety awareness;Pain  PT Treatment Interventions DME instruction;Gait training;Stair training;Functional mobility training;Therapeutic activities;Therapeutic exercise;Balance training;Neuromuscular re-education;Cognitive remediation;Patient/family education   PT Goals (Current goals can be found in the Care Plan section) Acute Rehab PT Goals Patient Stated Goal: to take another walk PT Goal Formulation: With patient Time For Goal Achievement: 06/30/14 Potential to Achieve Goals: Good    Frequency Min 3X/week   Barriers to discharge        Co-evaluation               End of Session Equipment Utilized During  Treatment: Gait belt Activity Tolerance: Patient tolerated treatment well Patient left: in chair;with call bell/phone within reach;with chair alarm set Nurse Communication: Mobility status;Precautions         Time: 1610-9604 PT Time Calculation (min): 21 min   Charges:   PT Evaluation $Initial PT Evaluation Tier I: 1 Procedure PT Treatments $Therapeutic Activity: 8-22 mins   PT G CodesDonell Sievert, [place]  [phone] 06/26/2014, 12:05 PM

## 2014-06-26 NOTE — Progress Notes (Signed)
VASCULAR LAB PRELIMINARY  PRELIMINARY  PRELIMINARY  PRELIMINARY  Carotid Dopplers completed.    Preliminary report:  1-39% ICA stenosis.  Vertebral artery flow is antegrade.   Shawn Carattini, RVT 06/26/2014, 11:17 AM

## 2014-06-27 ENCOUNTER — Other Ambulatory Visit: Payer: Self-pay | Admitting: Cardiology

## 2014-06-27 DIAGNOSIS — N390 Urinary tract infection, site not specified: Secondary | ICD-10-CM | POA: Diagnosis not present

## 2014-06-27 DIAGNOSIS — I639 Cerebral infarction, unspecified: Secondary | ICD-10-CM

## 2014-06-27 LAB — CBC
HCT: 39 % (ref 39.0–52.0)
Hemoglobin: 13.1 g/dL (ref 13.0–17.0)
MCH: 31.6 pg (ref 26.0–34.0)
MCHC: 33.6 g/dL (ref 30.0–36.0)
MCV: 94.2 fL (ref 78.0–100.0)
PLATELETS: 82 10*3/uL — AB (ref 150–400)
RBC: 4.14 MIL/uL — ABNORMAL LOW (ref 4.22–5.81)
RDW: 14 % (ref 11.5–15.5)
WBC: 7.3 10*3/uL (ref 4.0–10.5)

## 2014-06-27 LAB — BASIC METABOLIC PANEL
ANION GAP: 11 (ref 5–15)
BUN: 22 mg/dL (ref 6–23)
CALCIUM: 8.7 mg/dL (ref 8.4–10.5)
CO2: 24 mEq/L (ref 19–32)
Chloride: 102 mEq/L (ref 96–112)
Creatinine, Ser: 0.64 mg/dL (ref 0.50–1.35)
GFR, EST NON AFRICAN AMERICAN: 88 mL/min — AB (ref >90)
Glucose, Bld: 134 mg/dL — ABNORMAL HIGH (ref 70–99)
POTASSIUM: 3.8 meq/L (ref 3.7–5.3)
SODIUM: 137 meq/L (ref 137–147)

## 2014-06-27 LAB — PHENYTOIN LEVEL, TOTAL: PHENYTOIN LVL: 10 ug/mL (ref 10.0–20.0)

## 2014-06-27 LAB — VALPROIC ACID LEVEL: VALPROIC ACID LVL: 54.8 ug/mL (ref 50.0–100.0)

## 2014-06-27 MED ORDER — CIPROFLOXACIN HCL 250 MG PO TABS: 250.0000 mg | ORAL_TABLET | Freq: Two times a day (BID) | ORAL | Status: DC

## 2014-06-27 MED ORDER — DIVALPROEX SODIUM 500 MG PO DR TAB
500.0000 mg | DELAYED_RELEASE_TABLET | Freq: Once | ORAL | Status: AC
  Administered 2014-06-27: 500 mg via ORAL
  Filled 2014-06-27: qty 1

## 2014-06-27 MED ORDER — ASPIRIN 81 MG PO TBEC: 81.0000 mg | DELAYED_RELEASE_TABLET | Freq: Every day | ORAL | Status: DC

## 2014-06-27 MED ORDER — PHENYTOIN 50 MG PO CHEW
400.0000 mg | CHEWABLE_TABLET | Freq: Once | ORAL | Status: AC
  Administered 2014-06-27: 400 mg via ORAL
  Filled 2014-06-27: qty 8

## 2014-06-27 NOTE — Progress Notes (Signed)
Called by primary service and asked to arrange for an OP 30 day event monitor for history of stroke. (I have arranged).  Corine Shelter PA-C 06/27/2014 11:44 AM

## 2014-06-27 NOTE — Progress Notes (Signed)
STROKE TEAM PROGRESS NOTE   HISTORY HPI: Zachary Ali is an 80 y.o. 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 He noted at 9PM that he was having dizziness and also noted that he was having difficulty playing the piece. He was able to drive home after practice and his wife noted that he was having difficulty with speech. He slept last evening and on awakening his symptoms were still present but did improve in the morning. After eating breakfast they recurred and have continued. Had difficulty with gait and needed to hold onto his wife for ambulation. Patient presented for evaluation at her request.  Date last known well: Date: 06/24/2014  Time last known well: Time: 21:00  tPA Given: No: Outside time window  SUBJECTIVE (INTERVAL HISTORY) No one is at the bedside.  Overall he feels his condition is unchanged from yesterday. His dilantin and depakote level were low so I gave him loading dose for both. This am the levels are low normal. EEG not able to be done during the weekend.   OBJECTIVE Temp:  [97.5 F (36.4 C)-98.7 F (37.1 C)] 98.1 F (36.7 C) (09/13 1349) Pulse Rate:  [67-81] 77 (09/13 1349) Cardiac Rhythm:  [-] Normal sinus rhythm (09/13 1123) Resp:  [16-20] 16 (09/13 1349) BP: (114-151)/(57-83) 129/58 mmHg (09/13 1349) SpO2:  [99 %-100 %] 100 % (09/13 1349) Weight:  [169 lb 5 oz (76.8 kg)] 169 lb 5 oz (76.8 kg) (09/13 0500)  No results found for this basename: GLUCAP,  in the last 168 hours  Recent Labs Lab 06/25/14 1901 06/25/14 2348 06/27/14 0850  NA 137  --  137  K 3.9  --  3.8  CL 103  --  102  CO2 21  --  24  GLUCOSE 96  --  134*  BUN 22  --  22  CREATININE 0.84 0.81 0.64  CALCIUM 8.6  --  8.7    Recent Labs Lab 06/25/14 1901  AST 23  ALT 17  ALKPHOS 61  BILITOT 0.3  PROT 6.0  ALBUMIN 2.8*    Recent Labs Lab 06/25/14 1901 06/25/14 2348 06/27/14 0850  WBC 10.7* 9.7 7.3  NEUTROABS 7.6  --   --   HGB 13.1 13.0 13.1   HCT 39.1 37.5* 39.0  MCV 97.3 95.2 94.2  PLT 89* 76* 82*    Recent Labs Lab 06/25/14 1902  TROPONINI <0.30   No results found for this basename: LABPROT, INR,  in the last 72 hours  Recent Labs  06/26/14 1129  COLORURINE AMBER*  LABSPEC 1.020  PHURINE 6.0  GLUCOSEU NEGATIVE  HGBUR MODERATE*  BILIRUBINUR NEGATIVE  KETONESUR 15*  PROTEINUR 30*  UROBILINOGEN 1.0  NITRITE NEGATIVE  LEUKOCYTESUR LARGE*       Component Value Date/Time   CHOL 142 06/26/2014 0605   TRIG 106 06/26/2014 0605   HDL 55 06/26/2014 0605   CHOLHDL 2.6 06/26/2014 0605   VLDL 21 06/26/2014 0605   LDLCALC 66 06/26/2014 0605   Lab Results  Component Value Date   HGBA1C 5.8* 06/26/2014   No results found for this basename: labopia,  cocainscrnur,  labbenz,  amphetmu,  thcu,  labbarb    No results found for this basename: ETH,  in the last 168 hours  Dg Chest 2 View  06/25/2014    IMPRESSION: No acute cardiopulmonary findings.     Ct Head Wo Contrast  06/25/2014   IMPRESSION: Age related cerebral atrophy, ventriculomegaly and  periventricular white matter disease.  No acute intracranial findings or mass lesions.      Mr Brain Wo Contrast  06/25/2014   IMPRESSION: MRI HEAD IMPRESSION:  1. No acute intracranial infarct or other abnormality identified. 2. Moderate generalized cerebral atrophy.    MRA HEAD   IMPRESSION:  1. Severe short-segment stenosis within the mid -distal right M1 segment. No other hemodynamically significant stenosis or proximal branch occlusion identified within the intracranial circulation. 2. Moderate multi focal atherosclerotic irregularity within the cavernous carotid arteries and middle cerebral arteries bilaterally. 3. Fetal origin of the left PCA.   Electronically Signed   By: Rise Mu M.D.   On: 06/25/2014 23:43   CUS - Bilateral: 1-39% ICA stenosis. Vertebral artery flow is antegrade.  2D echo - - Left ventricle: The cavity size was normal. Systolic function  was vigorous. The estimated ejection fraction was in the range of 65% to 70%. Wall motion was normal; there were no regional wall motion abnormalities. Doppler parameters are consistent with abnormal left ventricular relaxation (grade 1 diastolic dysfunction). Doppler parameters are consistent with high ventricular filling pressure. - Right ventricle: The cavity size was moderately dilated. Wall thickness was normal. Systolic function was mildly reduced. - Right atrium: The atrium was moderately dilated. Impressions: - No cardiac source of emboli was indentified.  EEG not preformed during weekend.  Component     Latest Ref Rng 06/26/2014 06/27/2014  Phenytoin Lvl     10.0 - 20.0 ug/mL 8.0 (L) 10.0  Valproic Acid Lvl     50.0 - 100.0 ug/mL 33.1 (L) 54.8   PHYSICAL EXAM  Temp:  [97.5 F (36.4 C)-98.7 F (37.1 C)] 98.1 F (36.7 C) (09/13 1349) Pulse Rate:  [67-81] 77 (09/13 1349) Resp:  [16-20] 16 (09/13 1349) BP: (114-151)/(57-83) 129/58 mmHg (09/13 1349) SpO2:  [99 %-100 %] 100 % (09/13 1349) Weight:  [169 lb 5 oz (76.8 kg)] 169 lb 5 oz (76.8 kg) (09/13 0500)  General - Well nourished, well developed, in no apparent distress.  Ophthalmologic - Sharp disc margins OU.  Cardiovascular - Regular rate and rhythm with no murmur.  Mental Status -  Level of arousal and orientation to place, and person were intact, but not to time. Language including expression, naming, repetition, comprehension was assessed and found intact.  Cranial Nerves II - XII - II - Visual field intact OU. III, IV, VI - Extraocular movements intact. V - Facial sensation intact bilaterally. VII - Facial movement intact bilaterally. VIII - Hearing & vestibular intact bilaterally. X - Palate elevates symmetrically. XI - Chin turning & shoulder shrug intact bilaterally. XII - Tongue protrusion intact.  Motor Strength - The patient's strength was normal in all extremities and pronator drift was absent.   Bulk was normal and fasciculations were absent.   Motor Tone - Muscle tone was assessed at the neck and appendages and was normal.  Reflexes - The patient's reflexes were normal in all extremities and he had no pathological reflexes.  Sensory - Light touch, temperature/pinprick, vibration and proprioception, and Romberg testing were assessed and were normal.    Coordination - The patient had normal movements in the hands and feet with no ataxia or dysmetria.  Tremor was absent.  Gait and Station - not tested due to safety.   ASSESSMENT/PLAN  Zachary Ali is a 80 y.o. male with history of seizure and SVT with dizziness, wobbly walking and speech slurry. He did not receive IV t-PA due to out  of window. Imaging did not show stroke but showed right M1 stenosis. Stroke work up underway.  TIA vs. Seizure vs. Encephalopathy due to UTI:       no antiplatelet  prior to admission, now on ASA  given  MRI  No acute infarct  MRA  Right M1 stenosis  Carotid Doppler unremarkable  LDL 66, no statin necessary as meeting goal of LDL <100  HgbA1c 5.8 WNL  lovenox for VTE prophylaxis  Cardiac diet   Activity as tolerated  Seizure - off phenobarbital 2011 - on depakote and dilantin - levels are low normal now after load - UTI lowers seizure threshold - give another dilantin  and depakote  load and then he needs to continue follow up with his PCP or epileptologist for continued management.  UTI - UA confirmed  - on rocephine - treatment as per primary team  Hypertension   Home meds:  Metoprolol. Resumed in hospital  BP 120-150 past 24h (06/27/2014 @ 3:28 PM)  SBP goal <140/90  Stable  Patient counseled to be compliant with his blood pressure medications  Other Stroke Risk Factors Hx of SVT - predictive factor for afib Recommend cardionet event monitoring for 30 days as outpt to rule out afib which will change the treatment plan.   Hospital day #  2  Neurology will sign off and please call with questions. Thank you for the consult.  Marvel Plan, MD PhD Stroke Neurology 06/27/2014 3:28 PM   To contact Stroke Continuity provider, please refer to [email or web address]. After hours, contact General Neurology

## 2014-06-27 NOTE — Progress Notes (Addendum)
[facility]                                                                              Patient Demographics  Zachary Ali, is a 80 y.o. male, DOB - April 15, 1929, WUJ:[id number]  Admit date - 06/25/2014   Admitting Physician Zachary Albee, MD  Outpatient Primary MD for the patient is Zachary Housekeeper, MD  LOS - 2   Chief Complaint  Patient presents with  . Altered Mental Status      HPI on 06/25/2014 This is a 80 y.o. year old male with significant past medical history of seizure disorder, hx/o SVT, ? Cerebral aneurysm 50-60 years ago who presented with encephalopathy, CVA. Wife reported that patient had worsening confusion, slurred speech, difficulty walking over past 3-4 days. Patient works as a Environmental education officer. Denied any recent falls. No recent seizure activity. Stated that this has been well controlled.  Presented to the ER afebrile, hemodynamically stable. BP 120s-130s. Satting >97% on RA. WBC mildly elevated at 10.7. hgb 13.1, Cr 0.84. CXR WNL. Head CT with age related changes, but no acute findings. Wife also reported behavior change. Noted to be uncooperative with nurses in ER. EKG NSR. Trop neg x 1. Pro BNP @ 953.    Assessment & Plan   Acute Encephalopathy -Possibly secondary to Questionable TIA vs CVA versus infectious etiology -CT of the head: No acute intracranial findings or mass lesions. -MRI of the brain:No acute intracranial infarct or other abnormality identified. -Echocardiogram: EF of 60-70%, grade 1 diastolic dysfunction, no cardiac source of embolism -Carotid Doppler: 1-39% ICA stenosis. Vertebral artery flow is antegrade.  -LDL 66,  A1c 5.8 -Neurology consulted and appreciated, recommend a 30 day event monitor -Will speak to cardiology regarding the monitor -PT consulted, recommended home health physical therapy -Possibly secondary to UTI -Chest x-ray negative, ammonia 20  Urinary tract infection -UA shows WBC TNTC, large leukocytes -Pending urine  culture -Continue ceftriaxone  History of seizure disorder -Supposedly controlled -Dilantin and Depakote levels were low, patient was given loading dose by neurology. -Repeat levels this morning were normal -Pending EEG -Continue Dilantin and Depakote along with seizure precautions  History of SVT -Currently in sinus rhythm, continue telemetry monitoring -Neurology recommended 30 day event monitor  Elevated BNP, diastolic dysfunction -Currently compensated, does not appear to be in exacerbation -Patient does have some edema in the lower extremities -No known history of congestive heart failure -Echocardiogram: EF 60-70%, grade 1 diastolic dysfunction -Will monitor daily weights, intake and output  Thrombocytopenia -Will d/c lovenox and continue to monitor CBC  Code Status: Full  Family Communication: None at bedside  Disposition Plan: Admitted  Time Spent in minutes   30 minutes  Procedures  Carotid Doppler: 1-39% ICA stenosis. Vertebral artery flow is antegrade.   Echocardiogram Study Conclusions - Left ventricle: The cavity size was normal. Systolic function was vigorous. The estimated ejection fraction was in the range of 65% to 70%. Wall motion was normal; there were no regional wall motion abnormalities. Doppler parameters are consistent with abnormal left ventricular relaxation (grade 1 diastolic dysfunction). Doppler parameters are consistent with high ventricular filling pressure. - Right ventricle: The cavity size was moderately dilated. Wall thickness  was normal. Systolic function was mildly reduced.  - Right atrium: The atrium was moderately dilated. Impressions: No cardiac source of emboli was indentified.  Consults   Neurology  DVT Prophylaxis  SCDs  Lab Results  Component Value Date   PLT 82* 06/27/2014    Medications  Scheduled Meds: .  stroke: mapping our early stages of recovery book   Does not apply Once  . aspirin EC  81 mg Oral Daily  .  cefTRIAXone (ROCEPHIN)  IV  1 g Intravenous Q24H  . divalproex  500 mg Oral TID  . enoxaparin (LOVENOX) injection  30 mg Subcutaneous Q24H  . metoprolol succinate  25 mg Oral Daily  . phenytoin  100 mg Oral TID   Continuous Infusions:  PRN Meds:.senna-docusate  Antibiotics    Anti-infectives   Start     Dose/Rate Route Frequency Ordered Stop   06/26/14 1500  cefTRIAXone (ROCEPHIN) 1 g in dextrose 5 % 50 mL IVPB     1 g 100 mL/hr over 30 Minutes Intravenous Every 24 hours 06/26/14 1342          Subjective:   Zachary Ali seen and examined today. Patient states that he would like to go home. He wishes for me to call his wife. He currently denies any chest pain, dizziness, headache, shortness of breath.   Objective:   Filed Vitals:   06/27/14 0400 06/27/14 0500 06/27/14 0601 06/27/14 0945  BP:   136/58 114/68  Pulse:   67 73  Temp:   98.1 F (36.7 C) 97.5 F (36.4 C)  TempSrc:   Oral Oral  Resp: Height:      Weight:  76.8 kg (169 lb 5 oz)    SpO2:   100% 100%    Wt Readings from Last 3 Encounters:  06/27/14 76.8 kg (169 lb 5 oz)  08/25/13 91.173 kg (201 lb)     Intake/Output Summary (Last 24 hours) at 06/27/14 1044 Last data filed at 06/27/14 1610  Gross per 24 hour  Intake      0 ml  Output    550 ml  Net   -550 ml    Exam  General: Well developed, well nourished, NAD, appears stated age  HEENT: NCAT,  mucous membranes moist.   Cardiovascular: S1 S2 auscultated, no rubs, murmurs or gallops. Regular rate and rhythm.  Respiratory: Clear to auscultation bilaterally with equal chest rise  Abdomen: Soft, nontender, nondistended, + bowel sounds  Extremities: warm dry without cyanosis clubbing. + Edema  Neuro: AAOx3, no somatic dysfunctions   Psych: Normal affect and demeanor with intact judgement and insight  Data Review   Micro Results No results found for this or any previous visit (from the past 240 hour(s)).  Radiology Reports Dg  Chest 2 View  06/25/2014   CLINICAL DATA:  Cough.  EXAM: CHEST  2 VIEW  COMPARISON:  04/08/2013.  FINDINGS: The cardiac silhouette, mediastinal and hilar contours are within normal limits and stable. There is tortuosity and calcification of the thoracic aorta. The lungs are clear. No pleural effusion. The bony thorax is intact.  IMPRESSION: No acute cardiopulmonary findings.   Electronically Signed   By: Loralie Champagne M.D.   On: 06/25/2014 19:14   Ct Head Wo Contrast  06/25/2014   CLINICAL DATA:  Slurred speech and difficulty walking.  EXAM: CT HEAD WITHOUT CONTRAST  TECHNIQUE: Contiguous axial images were obtained from the base of the skull through the vertex without  intravenous contrast.  COMPARISON:  06/24/2006.  FINDINGS: Stable age related cerebral atrophy, ventriculomegaly and periventricular white matter disease. 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 No extra-axial fluid collections are identified. No CT findings for acute hemispheric infarction or intracranial hemorrhage. No mass lesions. The brainstem and cerebellum are normal.  The bony structures are intact. The paranasal sinuses and mastoid air cells are clear. The globes are intact.  IMPRESSION: Age related cerebral atrophy, ventriculomegaly and periventricular white matter disease.  No acute intracranial findings or mass lesions.   Electronically Signed   By: Loralie Champagne M.D.   On: 06/25/2014 18:28   Mr Brain Wo Contrast  06/25/2014   CLINICAL DATA:  Mild right hemi paresis, speech difficulty.  EXAM: MRI HEAD WITHOUT CONTRAST  MRA HEAD WITHOUT CONTRAST  TECHNIQUE: Multiplanar, multiecho pulse sequences of the brain and surrounding structures were obtained without intravenous contrast. Angiographic images of the head were obtained using MRA technique without contrast.  COMPARISON:  None.  Prior CT from earlier the same day.  FINDINGS: MRI HEAD FINDINGS  Diffuse prominence of the CSF containing spaces is compatible with  age-related generalized cerebral atrophy. Minimal T2/FLAIR hyperintensity seen within the periventricular and deep white matter, within normal limits for patient age.  No diffusion-weighted signal abnormality to suggest acute intracranial infarct. Gray-white matter differentiation well maintained. Normal flow voids seen within the intracranial vasculature. There is no intracranial hemorrhage. Few punctate chronic micro hemorrhages noted within the left frontal lobe, which may be related to underlying hypertension.  No mass lesion or midline shift. Mild ventricular prominence related at global atrophy present without hydrocephalus. No extra-axial fluid collection.  Craniocervical junction widely patent. Pituitary gland within normal limits. No acute abnormality seen about either orbit. Lens replacement noted at the left orbit.  Calvarium and scalp soft tissues demonstrate a normal appearance. Mild multilevel degenerative spondylolysis noted within the upper cervical spine.  Paranasal sinuses and mastoid air cells are clear.  MRA HEAD FINDINGS  ANTERIOR CIRCULATION:  Study is degraded by motion artifact.  Visualized portions of the distal atherosclerotic irregularity present within the cavernous carotid arteries bilaterally without hemodynamically significant stenosis. Supra clinoid segments well opacified.  Multi focal atherosclerotic irregularity noted within the A1 segments without significant stenosis. Anterior communicating artery and anterior cerebral arteries are within normal limits.  Moderate multi focal atherosclerotic irregularity present within the M1 segments bilaterally. There is a severe stenosis within the distal right M1 segment. Stenosis measures approximately 3 mm in length. The right MCA branches are well opacified distally. Left MCA branches are normal.  No aneurysm within the anterior circulation.  POSTERIOR CIRCULATION:  Vertebral arteries are codominant without significant stenosis or  occlusion. Posterior inferior cerebral arteries not well visualized. Vertebrobasilar junction and basilar artery are widely patent with antegrade flow. Proximal superior cerebral arteries are widely patent. Fetal origin of the left PCA noted. Posterior lower cerebral arteries are opacified without hemodynamically significant stenosis.  IMPRESSION: MRI HEAD IMPRESSION:  1. No acute intracranial infarct or other abnormality identified. 2. Moderate generalized cerebral atrophy.  MRA HEAD IMPRESSION:  1. Severe short-segment stenosis within the mid -distal right M1 segment. No other hemodynamically significant stenosis or proximal branch occlusion identified within the intracranial circulation. 2. Moderate multi focal atherosclerotic irregularity within the cavernous carotid arteries and middle cerebral arteries bilaterally. 3. Fetal origin of the left PCA.   Electronically Signed   By: Rise Mu M.D.   On: 06/25/2014 23:43   Mr Maxine Glenn Head/brain Wo Cm  06/25/2014   CLINICAL DATA:  Mild right hemi paresis, speech difficulty.  EXAM: MRI HEAD WITHOUT CONTRAST  MRA HEAD WITHOUT CONTRAST  TECHNIQUE: Multiplanar, multiecho pulse sequences of the brain and surrounding structures were obtained without intravenous contrast. Angiographic images of the head were obtained using MRA technique without contrast.  COMPARISON:  None.  Prior CT from earlier the same day.  FINDINGS: MRI HEAD FINDINGS  Diffuse prominence of the CSF containing spaces is compatible with age-related generalized cerebral atrophy. Minimal T2/FLAIR hyperintensity seen within the periventricular and deep white matter, within normal limits for patient age.  No diffusion-weighted signal abnormality to suggest acute intracranial infarct. Gray-white matter differentiation well maintained. Normal flow voids seen within the intracranial vasculature. There is no intracranial hemorrhage. Few punctate chronic micro hemorrhages noted within the left frontal  lobe, which may be related to underlying hypertension.  No mass lesion or midline shift. Mild ventricular prominence related at global atrophy present without hydrocephalus. No extra-axial fluid collection.  Craniocervical junction widely patent. Pituitary gland within normal limits. No acute abnormality seen about either orbit. Lens replacement noted at the left orbit.  Calvarium and scalp soft tissues demonstrate a normal appearance. Mild multilevel degenerative spondylolysis noted within the upper cervical spine.  Paranasal sinuses and mastoid air cells are clear.  MRA HEAD FINDINGS  ANTERIOR CIRCULATION:  Study is degraded by motion artifact.  Visualized portions of the distal atherosclerotic irregularity present within the cavernous carotid arteries bilaterally without hemodynamically significant stenosis. Supra clinoid segments well opacified.  Multi focal atherosclerotic irregularity noted within the A1 segments without significant stenosis. Anterior communicating artery and anterior cerebral arteries are within normal limits.  Moderate multi focal atherosclerotic irregularity present within the M1 segments bilaterally. There is a severe stenosis within the distal right M1 segment. Stenosis measures approximately 3 mm in length. The right MCA branches are well opacified distally. Left MCA branches are normal.  No aneurysm within the anterior circulation.  POSTERIOR CIRCULATION:  Vertebral arteries are codominant without significant stenosis or occlusion. Posterior inferior cerebral arteries not well visualized. Vertebrobasilar junction and basilar artery are widely patent with antegrade flow. Proximal superior cerebral arteries are widely patent. Fetal origin of the left PCA noted. Posterior lower cerebral arteries are opacified without hemodynamically significant stenosis.  IMPRESSION: MRI HEAD IMPRESSION:  1. No acute intracranial infarct or other abnormality identified. 2. Moderate generalized cerebral  atrophy.  MRA HEAD IMPRESSION:  1. Severe short-segment stenosis within the mid -distal right M1 segment. No other hemodynamically significant stenosis or proximal branch occlusion identified within the intracranial circulation. 2. Moderate multi focal atherosclerotic irregularity within the cavernous carotid arteries and middle cerebral arteries bilaterally. 3. Fetal origin of the left PCA.   Electronically Signed   By: Rise Mu M.D.   On: 06/25/2014 23:43    CBC  Recent Labs Lab 06/25/14 1901 06/25/14 2348 06/27/14 0850  WBC 10.7* 9.7 7.3  HGB 13.1 13.0 13.1  HCT 39.1 37.5* 39.0  PLT 89* 76* 82*  MCV 97.3 95.2 94.2  MCH 32.6 33.0 31.6  MCHC 33.5 34.7 33.6  RDW 14.4 14.3 14.0  LYMPHSABS 1.4  --   --   MONOABS 1.7*  --   --   EOSABS 0.0  --   --   BASOSABS 0.0  --   --     Chemistries   Recent Labs Lab 06/25/14 1901 06/25/14 2348 06/27/14 0850  NA 137  --  137  K 3.9  --  3.8  CL  103  --  102  CO2 21  --  24  GLUCOSE 96  --  134*  BUN 22  --  22  CREATININE 0.84 0.81 0.64  CALCIUM 8.6  --  8.7  AST 23  --   --   ALT 17  --   --   ALKPHOS 61  --   --   BILITOT 0.3  --   --    ------------------------------------------------------------------------------------------------------------------ estimated creatinine clearance is 67.7 ml/min (by C-G formula based on Cr of 0.64). ------------------------------------------------------------------------------------------------------------------  Recent Labs  06/26/14 0608  HGBA1C 5.8*   ------------------------------------------------------------------------------------------------------------------  Recent Labs  06/26/14 0605  CHOL 142  HDL 55  LDLCALC 66  TRIG 106  CHOLHDL 2.6   ------------------------------------------------------------------------------------------------------------------ No results found for this basename: TSH, T4TOTAL, FREET3, T3FREE, THYROIDAB,  in the last 72  hours ------------------------------------------------------------------------------------------------------------------ No results found for this basename: VITAMINB12, FOLATE, FERRITIN, TIBC, IRON, RETICCTPCT,  in the last 72 hours  Coagulation profile No results found for this basename: INR, PROTIME,  in the last 168 hours  No results found for this basename: DDIMER,  in the last 72 hours  Cardiac Enzymes  Recent Labs Lab 06/25/14 1902  TROPONINI <0.30   ------------------------------------------------------------------------------------------------------------------ No components found with this basename: POCBNP,     Karrigan Messamore D.O. on 06/27/2014 at 10:44 AM  Between 7am to 7pm - Pager - [phone]  After 7pm go to [email or web address] - password [email or web address]  And look for the night coverage person covering for me after hours  [facility] Office  [phone]

## 2014-06-27 NOTE — Discharge Summary (Signed)
Physician Discharge Summary  Zachary Ali:[id number] DOB: December 25, 1928 DOA: 06/25/2014  PCP: Georgann Housekeeper, MD  Admit date: 06/25/2014 Discharge date: 06/27/2014  Time spent: 45 minutes  Recommendations for Outpatient Follow-up:  Patient will be discharged home with home health physical therapy. He is to follow up with his primary care physician within one week of discharge. Should she continue taking his medications as prescribed. Patient should follow a heart healthy diet.  Discharge Diagnoses:  Acute encephalopathy Urinary tract infection History of seizure disorder History of SVT Elevated BNP/diastolic dysfunction Thrombocytopenia  Discharge Condition: Stable  Diet recommendation: Heart healthy  Filed Weights   06/25/14 2300 06/27/14 0500  Weight: 77.8 kg (171 lb 8.3 oz) 76.8 kg (169 lb 5 oz)    History of present illness:  on 06/25/2014  This is a 80 y.o. year old male with significant past medical history of seizure disorder, hx/o SVT, ? Cerebral aneurysm 50-60 years ago who presented with encephalopathy, CVA. Wife reported that patient had worsening confusion, slurred speech, difficulty walking over past 3-4 days. Patient works as a Environmental education officer. Denied any recent falls. No recent seizure activity. Stated that this has been well controlled.  Presented to the ER afebrile, hemodynamically stable. BP 120s-130s. Satting >97% on RA. WBC mildly elevated at 10.7. hgb 13.1, Cr 0.84. CXR WNL. Head CT with age related changes, but no acute findings. Wife also reported behavior change. Noted to be uncooperative with nurses in ER. EKG NSR. Trop neg x 1. Pro BNP @ 953.   Hospital Course:  Acute Encephalopathy  -Possibly secondary to Questionable TIA vs CVA versus infectious etiology  -CT of the head: No acute intracranial findings or mass lesions.  -MRI of the brain:No acute intracranial infarct or other abnormality identified.  -Echocardiogram: EF of 60-70%, grade 1 diastolic  dysfunction, no cardiac source of embolism  -Carotid Doppler: 1-39% ICA stenosis. Vertebral artery flow is antegrade.  -LDL 66, A1c 5.8  -Neurology consulted and appreciated, recommend a 30 day event monitor  -Will speak to cardiology regarding the monitor  -PT consulted, recommended home health physical therapy  -Possibly secondary to UTI  -Chest x-ray negative, ammonia 20   Urinary tract infection  -UA shows WBC TNTC, large leukocytes  -Pending urine culture and can be followed up by her primary care physician -Will place patient on ciprofloxacin  History of seizure disorder  -Supposedly controlled  -Dilantin and Depakote levels were low, patient was given loading dose by neurology.  -Repeat levels this morning were normal  -EEG cannot be done until 9/14, patient wishes to go home.  Spoke with Dr. Roda Shutters, who recommended giving patient additional loads of Dilantin and Depakote before discharge and that since patient did not appear to be having seizures, he can be discharged without EEG and can follow up with his neurologist or PCP. -Continue Dilantin and Depakote along with seizure precautions   History of SVT  -Currently in sinus rhythm, continue telemetry monitoring  -Neurology recommended 30 day event monitor   Elevated BNP, diastolic dysfunction  -Currently compensated, does not appear to be in exacerbation  -Patient does have some edema in the lower extremities  -No known history of congestive heart failure  -Echocardiogram: EF 60-70%, grade 1 diastolic dysfunction  -Monitored daily weights, intake and output   Thrombocytopenia  -Will d/c lovenox and continue to monitor CBC   Procedures: Carotid Doppler: 1-39% ICA stenosis. Vertebral artery flow is antegrade.   Echocardiogram  Study Conclusions - Left ventricle: The cavity size  was normal. Systolic function was vigorous. The estimated ejection fraction was in the range of 65% to 70%. Wall motion was normal; there were no  regional wall motion abnormalities. Doppler parameters are consistent with abnormal left ventricular relaxation (grade 1 diastolic dysfunction). Doppler parameters are consistent with high ventricular filling pressure. - Right ventricle: The cavity size was moderately dilated. Wall thickness was normal. Systolic function was mildly reduced.  - Right atrium: The atrium was moderately dilated. Impressions: No cardiac source of emboli was indentified.  Consultations: Neurology  Discharge Exam: Filed Vitals:   06/27/14 0945  BP: 114/68  Pulse: 73  Temp: 97.5 F (36.4 C)  Resp: 20   Exam  General: Well developed, well nourished, NAD, appears stated age  HEENT: NCAT, mucous membranes moist.  Cardiovascular: S1 S2 auscultated, no rubs, murmurs or gallops. Regular rate and rhythm.  Respiratory: Clear to auscultation bilaterally with equal chest rise  Abdomen: Soft, nontender, nondistended, + bowel sounds  Extremities: warm dry without cyanosis clubbing. + Edema  Neuro: AAOx3, no somatic dysfunctions  Psych: Normal affect and demeanor with intact judgement and insight  Discharge Instructions     Medication List         aspirin 81 MG EC tablet  Take 1 tablet (81 mg total) by mouth daily.     ciprofloxacin 250 MG tablet  Commonly known as:  CIPRO  Take 1 tablet (250 mg total) by mouth 2 (two) times daily.     divalproex 500 MG DR tablet  Commonly known as:  DEPAKOTE  Take 500 mg by mouth 3 (three) times daily.     metoprolol succinate 25 MG 24 hr tablet  Commonly known as:  TOPROL-XL  Take 25 mg by mouth daily.     phenytoin 100 MG ER capsule  Commonly known as:  DILANTIN  Take 1 capsule (100 mg total) by mouth 3 (three) times daily.       No Known Allergies Follow-up Information   Follow up with Neurologist. Schedule an appointment as soon as possible for a visit in 1 week. [facility] follow up)       Follow up with Georgann Housekeeper, MD. Schedule an appointment as soon as  possible for a visit in 1 week. [facility] follow up)    Specialty:  Internal Medicine   Contact information:   [address] [place] [address] [phone]        The results of significant diagnostics from this hospitalization (including imaging, microbiology, ancillary and laboratory) are listed below for reference.    Significant Diagnostic Studies: Dg Chest 2 View  06/25/2014   CLINICAL DATA:  Cough.  EXAM: CHEST  2 VIEW  COMPARISON:  04/08/2013.  FINDINGS: The cardiac silhouette, mediastinal and hilar contours are within normal limits and stable. There is tortuosity and calcification of the thoracic aorta. The lungs are clear. No pleural effusion. The bony thorax is intact.  IMPRESSION: No acute cardiopulmonary findings.   Electronically Signed   By: Loralie Champagne M.D.   On: 06/25/2014 19:14   Ct Head Wo Contrast  06/25/2014   CLINICAL DATA:  Slurred speech and difficulty walking.  EXAM: CT HEAD WITHOUT CONTRAST  TECHNIQUE: Contiguous axial images were obtained from the base of the skull through the vertex without intravenous contrast.  COMPARISON:  06/24/2006.  FINDINGS: Stable age related cerebral atrophy, ventriculomegaly and periventricular white matter disease. 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 No extra-axial fluid collections are identified. No CT findings for  acute hemispheric infarction or intracranial hemorrhage. No mass lesions. The brainstem and cerebellum are normal.  The bony structures are intact. The paranasal sinuses and mastoid air cells are clear. The globes are intact.  IMPRESSION: Age related cerebral atrophy, ventriculomegaly and periventricular white matter disease.  No acute intracranial findings or mass lesions.   Electronically Signed   By: Loralie Champagne M.D.   On: 06/25/2014 18:28   Mr Brain Wo Contrast  06/25/2014   CLINICAL DATA:  Mild right hemi paresis, speech difficulty.  EXAM: MRI HEAD WITHOUT CONTRAST  MRA  HEAD WITHOUT CONTRAST  TECHNIQUE: Multiplanar, multiecho pulse sequences of the brain and surrounding structures were obtained without intravenous contrast. Angiographic images of the head were obtained using MRA technique without contrast.  COMPARISON:  None.  Prior CT from earlier the same day.  FINDINGS: MRI HEAD FINDINGS  Diffuse prominence of the CSF containing spaces is compatible with age-related generalized cerebral atrophy. Minimal T2/FLAIR hyperintensity seen within the periventricular and deep white matter, within normal limits for patient age.  No diffusion-weighted signal abnormality to suggest acute intracranial infarct. Gray-white matter differentiation well maintained. Normal flow voids seen within the intracranial vasculature. There is no intracranial hemorrhage. Few punctate chronic micro hemorrhages noted within the left frontal lobe, which may be related to underlying hypertension.  No mass lesion or midline shift. Mild ventricular prominence related at global atrophy present without hydrocephalus. No extra-axial fluid collection.  Craniocervical junction widely patent. Pituitary gland within normal limits. No acute abnormality seen about either orbit. Lens replacement noted at the left orbit.  Calvarium and scalp soft tissues demonstrate a normal appearance. Mild multilevel degenerative spondylolysis noted within the upper cervical spine.  Paranasal sinuses and mastoid air cells are clear.  MRA HEAD FINDINGS  ANTERIOR CIRCULATION:  Study is degraded by motion artifact.  Visualized portions of the distal atherosclerotic irregularity present within the cavernous carotid arteries bilaterally without hemodynamically significant stenosis. Supra clinoid segments well opacified.  Multi focal atherosclerotic irregularity noted within the A1 segments without significant stenosis. Anterior communicating artery and anterior cerebral arteries are within normal limits.  Moderate multi focal atherosclerotic  irregularity present within the M1 segments bilaterally. There is a severe stenosis within the distal right M1 segment. Stenosis measures approximately 3 mm in length. The right MCA branches are well opacified distally. Left MCA branches are normal.  No aneurysm within the anterior circulation.  POSTERIOR CIRCULATION:  Vertebral arteries are codominant without significant stenosis or occlusion. Posterior inferior cerebral arteries not well visualized. Vertebrobasilar junction and basilar artery are widely patent with antegrade flow. Proximal superior cerebral arteries are widely patent. Fetal origin of the left PCA noted. Posterior lower cerebral arteries are opacified without hemodynamically significant stenosis.  IMPRESSION: MRI HEAD IMPRESSION:  1. No acute intracranial infarct or other abnormality identified. 2. Moderate generalized cerebral atrophy.  MRA HEAD IMPRESSION:  1. Severe short-segment stenosis within the mid -distal right M1 segment. No other hemodynamically significant stenosis or proximal branch occlusion identified within the intracranial circulation. 2. Moderate multi focal atherosclerotic irregularity within the cavernous carotid arteries and middle cerebral arteries bilaterally. 3. Fetal origin of the left PCA.   Electronically Signed   By: Rise Mu M.D.   On: 06/25/2014 23:43   Mr Maxine Glenn Head/brain Wo Cm  06/25/2014   CLINICAL DATA:  Mild right hemi paresis, speech difficulty.  EXAM: MRI HEAD WITHOUT CONTRAST  MRA HEAD WITHOUT CONTRAST  TECHNIQUE: Multiplanar, multiecho pulse sequences of the brain and surrounding structures were  obtained without intravenous contrast. Angiographic images of the head were obtained using MRA technique without contrast.  COMPARISON:  None.  Prior CT from earlier the same day.  FINDINGS: MRI HEAD FINDINGS  Diffuse prominence of the CSF containing spaces is compatible with age-related generalized cerebral atrophy. Minimal T2/FLAIR hyperintensity seen  within the periventricular and deep white matter, within normal limits for patient age.  No diffusion-weighted signal abnormality to suggest acute intracranial infarct. Gray-white matter differentiation well maintained. Normal flow voids seen within the intracranial vasculature. There is no intracranial hemorrhage. Few punctate chronic micro hemorrhages noted within the left frontal lobe, which may be related to underlying hypertension.  No mass lesion or midline shift. Mild ventricular prominence related at global atrophy present without hydrocephalus. No extra-axial fluid collection.  Craniocervical junction widely patent. Pituitary gland within normal limits. No acute abnormality seen about either orbit. Lens replacement noted at the left orbit.  Calvarium and scalp soft tissues demonstrate a normal appearance. Mild multilevel degenerative spondylolysis noted within the upper cervical spine.  Paranasal sinuses and mastoid air cells are clear.  MRA HEAD FINDINGS  ANTERIOR CIRCULATION:  Study is degraded by motion artifact.  Visualized portions of the distal atherosclerotic irregularity present within the cavernous carotid arteries bilaterally without hemodynamically significant stenosis. Supra clinoid segments well opacified.  Multi focal atherosclerotic irregularity noted within the A1 segments without significant stenosis. Anterior communicating artery and anterior cerebral arteries are within normal limits.  Moderate multi focal atherosclerotic irregularity present within the M1 segments bilaterally. There is a severe stenosis within the distal right M1 segment. Stenosis measures approximately 3 mm in length. The right MCA branches are well opacified distally. Left MCA branches are normal.  No aneurysm within the anterior circulation.  POSTERIOR CIRCULATION:  Vertebral arteries are codominant without significant stenosis or occlusion. Posterior inferior cerebral arteries not well visualized. Vertebrobasilar  junction and basilar artery are widely patent with antegrade flow. Proximal superior cerebral arteries are widely patent. Fetal origin of the left PCA noted. Posterior lower cerebral arteries are opacified without hemodynamically significant stenosis.  IMPRESSION: MRI HEAD IMPRESSION:  1. No acute intracranial infarct or other abnormality identified. 2. Moderate generalized cerebral atrophy.  MRA HEAD IMPRESSION:  1. Severe short-segment stenosis within the mid -distal right M1 segment. No other hemodynamically significant stenosis or proximal branch occlusion identified within the intracranial circulation. 2. Moderate multi focal atherosclerotic irregularity within the cavernous carotid arteries and middle cerebral arteries bilaterally. 3. Fetal origin of the left PCA.   Electronically Signed   By: Rise Mu M.D.   On: 06/25/2014 23:43    Microbiology: No results found for this or any previous visit (from the past 240 hour(s)).   Labs: Basic Metabolic Panel:  Recent Labs Lab 06/25/14 1901 06/25/14 2348 06/27/14 0850  NA 137  --  137  K 3.9  --  3.8  CL 103  --  102  CO2 21  --  24  GLUCOSE 96  --  134*  BUN 22  --  22  CREATININE 0.84 0.81 0.64  CALCIUM 8.6  --  8.7   Liver Function Tests:  Recent Labs Lab 06/25/14 1901  AST 23  ALT 17  ALKPHOS 61  BILITOT 0.3  PROT 6.0  ALBUMIN 2.8*   No results found for this basename: LIPASE, AMYLASE,  in the last 168 hours  Recent Labs Lab 06/25/14 2348  AMMONIA 28   CBC:  Recent Labs Lab 06/25/14 1901 06/25/14 2348 06/27/14 0850  WBC  10.7* 9.7 7.3  NEUTROABS 7.6  --   --   HGB 13.1 13.0 13.1  HCT 39.1 37.5* 39.0  MCV 97.3 95.2 94.2  PLT 89* 76* 82*   Cardiac Enzymes:  Recent Labs Lab 06/25/14 1902  TROPONINI <0.30   BNP: BNP (last 3 results)  Recent Labs  06/25/14 1902  PROBNP 953.0*   CBG: No results found for this basename: GLUCAP,  in the last 168 hours     Signed:  Edsel Petrin  [facility] 06/27/2014, 11:19 AM

## 2014-06-27 NOTE — Progress Notes (Signed)
Physical Therapy Treatment Patient Details Name: VONTAE COURT MRN: [id number] DOB: [birth year] Today's Date: 06/27/2014    History of Present Illness 80 y.o. male presenting with difficulty with speech and a mild right hemiparesis.  Suspect and acute left brain ischemic event.  Head CT reviewed and unremarkable.  Patient presented outside of the time window and therefore not a tPA candidate.   MRI also negative. Further workup underway.    PT Comments    Pt continues to demo decreased safety awareness throughout session. Pt very distractible with mobility and requires min (A) to maintain balance and manage RW. Pt unsafe to D/C home at this time due to lack of physical (A) available from wife. Pt is being treated for UTI, so cognition status may improve to allow safe D/C home. Will cont to follow per POC.  Follow Up Recommendations  Home health PT;Supervision/Assistance - 24 hour (pending progress)     Equipment Recommendations  Rolling walker with 5" wheels    Recommendations for Other Services       Precautions / Restrictions Precautions Precautions: Fall Precaution Comments: denies any falls Restrictions Weight Bearing Restrictions: No    Mobility  Bed Mobility Overal bed mobility: Needs Assistance Bed Mobility: Supine to Sit     Supine to sit: Min assist;HOB elevated     General bed mobility comments: pt with difficulty scooting hips out to EOB; max cues for sequencing; pt became frustrated with difficulty of bed mobility   Transfers Overall transfer level: Needs assistance Equipment used: Rolling walker (2 wheeled) Transfers: Sit to/from Stand Sit to Stand: Min assist         General transfer comment: cues for hand placement and sequencing with RW: posterior lean   Ambulation/Gait Ambulation/Gait assistance: Min assist Ambulation Distance (Feet): 80 Feet Assistive device: Rolling walker (2 wheeled) Gait Pattern/deviations: Step-through pattern;Staggering  left;Narrow base of support;Trunk flexed;Decreased stride length Gait velocity: cues to slow down; pt impulsively quick and unsafe   General Gait Details: pt with heavy lean to Lt; max cues for safety and multimodal cues for RW placement; pt is a high fall risk and is easily distractible; hand over hand (A) at times to keep hands on RW   Stairs            Wheelchair Mobility    Modified Rankin (Stroke Patients Only) Modified Rankin (Stroke Patients Only) Pre-Morbid Rankin Score: No symptoms Modified Rankin: Moderately severe disability     Balance Overall balance assessment: Needs assistance Sitting-balance support: Feet supported;No upper extremity supported Sitting balance-Leahy Scale: Fair   Postural control: Posterior lean Standing balance support: During functional activity;Bilateral upper extremity supported Standing balance-Leahy Scale: Poor Standing balance comment: required RW for balance                    Cognition Arousal/Alertness: Awake/alert Behavior During Therapy: Impulsive;Restless Overall Cognitive Status: Impaired/Different from baseline Area of Impairment: Attention;Memory;Safety/judgement;Awareness;Problem solving   Current Attention Level: Selective Memory: Decreased short-term memory Following Commands: Follows one step commands with increased time Safety/Judgement: Decreased awareness of safety;Decreased awareness of deficits Awareness: Emergent Problem Solving: Slow processing;Difficulty sequencing;Requires verbal cues General Comments: pt continues to confabulate; demos decr safety awareness throughout session; max cues to stay on task; pt easily distractible    Exercises      General Comments General comments (skin integrity, edema, etc.): discussed importance of calling for nursing for OOB activities to reduce risk of falls       Pertinent Vitals/Pain Pain Assessment:  No/denies pain    Home Living                       Prior Function            PT Goals (current goals can now be found in the care plan section) Acute Rehab PT Goals Patient Stated Goal: to take another walk PT Goal Formulation: With patient Time For Goal Achievement: 06/30/14 Potential to Achieve Goals: Good Progress towards PT goals: Progressing toward goals    Frequency  Min 3X/week    PT Plan Current plan remains appropriate    Co-evaluation             End of Session Equipment Utilized During Treatment: Gait belt Activity Tolerance: Patient tolerated treatment well Patient left: in chair;with call bell/phone within reach;with chair alarm set     Time: 8469-6295 PT Time Calculation (min): 19 min  Charges:  $Gait Training: 8-22 mins                    G CodesDonnamarie Poag [place] , [place]  [phone]  06/27/2014, 12:20 PM

## 2014-06-27 NOTE — Progress Notes (Signed)
Occupational Therapy Treatment Patient Details Name: Zachary Ali MRN: [id number] DOB: 1929/09/02 Today's Date: 06/27/2014    History of present illness 80 y.o. male presenting with difficulty with speech and a mild right hemiparesis.  Suspect and acute left brain ischemic event.  Head CT reviewed and unremarkable.  Patient presented outside of the time window and therefore not a tPA candidate.   MRI also negative. Further workup underway.   OT comments  Pt continues to demonstrate apparent cognitive deficits. Recommending SNF if mental status does not improve.   Follow Up Recommendations  Home health OT;Supervision/Assistance - 24 hour;Other (comment) (only if mental status improves)    Equipment Recommendations  Tub/shower bench    Recommendations for Other Services      Precautions / Restrictions Precautions Precautions: Fall Precaution Comments: denies any falls Restrictions Weight Bearing Restrictions: No       Mobility Bed Mobility Overal bed mobility: Needs Assistance Bed Mobility: Supine to Sit;Sit to Supine     Supine to sit: Min assist Sit to supine: Supervision   General bed mobility comments: Cues for technique.  Transfers Overall transfer level: Needs assistance Equipment used: Rolling walker (2 wheeled) Transfers: Sit to/from Stand Sit to Stand: Min assist;Min guard         General transfer comment: cues for technique.    Balance                                   ADL Overall ADL's : Needs assistance/impaired     Grooming: Oral care;Wash/dry face;Brushing hair;Applying deodorant;Min guard;Standing   Upper Body Bathing: Minimal assitance;Standing;Sitting   Lower Body Bathing: Minimal assistance;Sit to/from stand   Upper Body Dressing : Min guard;Standing   Lower Body Dressing: Supervision/safety;Sitting/lateral leans (donned/doffed socks)   Toilet Transfer: Minimal assistance;RW;Ambulation           Functional mobility  during ADLs: Minimal assistance;Rolling walker (ambulated some in hallway) General ADL Comments: Pt performed ADL tasks at sink. Pt requiring several cues due to apparent cognitive deficits. OT assisted in helping pt clean more thoroughly. At one point, pt had soap on wash cloth but barely any water, if any.  Min guard for balance at sink.      Vision                     Perception     Praxis      Cognition   Behavior During Therapy: [facility] for tasks assessed/performed Overall Cognitive Status: Impaired/Different from baseline Area of Impairment: Attention;Following commands;Safety/judgement;Problem solving;Memory   Current Attention Level: Selective Memory: Decreased short-term memory  Following Commands: Follows one step commands with increased time Safety/Judgement: Decreased awareness of safety;Decreased awareness of deficits   Problem Solving: Slow processing General Comments: cues to stay on task    Extremity/Trunk Assessment               Exercises     Shoulder Instructions       General Comments      Pertinent Vitals/ Pain       Pain Assessment: No/denies pain  Home Living                                          Prior Functioning/Environment  Frequency Min 2X/week     Progress Toward Goals  OT Goals(current goals can now be found in the care plan section)  Progress towards OT goals: Progressing toward goals  Acute Rehab OT Goals Patient Stated Goal: not stated OT Goal Formulation: With patient/family Time For Goal Achievement: 07/10/14 Potential to Achieve Goals: Good ADL Goals Pt Will Perform Grooming: with modified independence;standing Pt Will Perform Lower Body Bathing: with modified independence;sit to/from stand Pt Will Perform Lower Body Dressing: with modified independence;sit to/from stand Pt Will Transfer to Toilet: with modified independence;ambulating Pt Will Perform Toileting - Clothing  Manipulation and hygiene: with modified independence;sit to/from stand  Plan Discharge plan remains appropriate    Co-evaluation                 End of Session Equipment Utilized During Treatment: Gait belt;Rolling walker   Activity Tolerance Patient tolerated treatment well   Patient Left in bed;with call bell/phone within reach;with family/visitor present   Nurse Communication          Time: 1610-9604 OT Time Calculation (min): 32 min  Charges: OT General Charges $OT Visit: 1 Procedure OT Treatments $Self Care/Home Management : 23-37 mins  Earlie Raveling OTR/L [phone] 06/27/2014, 5:58 PM

## 2014-06-27 NOTE — Discharge Instructions (Signed)
Altered Mental Status Altered mental status most often refers to an abnormal change in your responsiveness and awareness. It can affect your speech, thought, mobility, memory, attention span, or alertness. It can range from slight confusion to complete unresponsiveness (coma). Altered mental status can be a sign of a serious underlying medical condition. Rapid evaluation and medical treatment is necessary for patients having an altered mental status. CAUSES   Low blood sugar (hypoglycemia) or diabetes.  Severe loss of body fluids (dehydration) or a body salt (electrolyte) imbalance.  A stroke or other neurologic problem, such as dementia or delirium.  A head injury or tumor.  A drug or alcohol overdose.  Exposure to toxins or poisons.  Depression, anxiety, and stress.  A low oxygen level (hypoxia).  An infection.  Blood loss.  Twitching or shaking (seizure).  Heart problems, such as heart attack or heart rhythm problems (arrhythmias).  A body temperature that is too low or too high (hypothermia or hyperthermia). DIAGNOSIS  A diagnosis is based on your history, symptoms, physical and neurologic examinations, and diagnostic tests. Diagnostic tests may include:  Measurement of your blood pressure, pulse, breathing, and oxygen levels (vital signs).  Blood tests.  Urine tests.  X-ray exams.  A computerized magnetic scan (magnetic resonance imaging, MRI).  A computerized X-ray scan (computed tomography, CT scan). TREATMENT  Treatment will depend on the cause. Treatment may include:  Management of an underlying medical or mental health condition.  Critical care or support in the hospital. HOME CARE INSTRUCTIONS   Only take over-the-counter or prescription medicines for pain, discomfort, or fever as directed by your caregiver.  Manage underlying conditions as directed by your caregiver.  Eat a healthy, well-balanced diet to maintain strength.  Join a support group or  prevention program to cope with the condition or trauma that caused the altered mental status. Ask your caregiver to help choose a program that works for you.  Follow up with your caregiver for further examination, therapy, or testing as directed. SEEK MEDICAL CARE IF:   You feel unwell or have chills.  You or your family notice a change in your behavior or your alertness.  You have trouble following your caregiver's treatment plan.  You have questions or concerns. SEEK IMMEDIATE MEDICAL CARE IF:   You have a rapid heartbeat or have chest pain.  You have difficulty breathing.  You have a fever.  You have a headache with a stiff neck.  You cough up blood.  You have blood in your urine or stool.  You have severe agitation or confusion. MAKE SURE YOU:   Understand these instructions.  Will watch your condition.  Will get help right away if you are not doing well or get worse. Document Released: 03/21/2010 Document Revised: 12/24/2011 Document Reviewed: 03/21/2010 [facility] Patient Information 2015 [place], [place]. This information is not intended to replace advice given to you by your health care provider. Make sure you discuss any questions you have with your health care provider. Cardiac Event Monitoring A cardiac event monitor is a small recording device used to help detect abnormal heart rhythms (arrhythmias). The monitor is used to record heart rhythm when noticeable symptoms such as the following occur:  Fast heartbeats (palpitations), such as heart racing or fluttering.  Dizziness.  Fainting or light-headedness.  Unexplained weakness. The monitor is wired to two electrodes placed on your chest. Electrodes are flat, sticky disks that attach to your skin. The monitor can be worn for up to 30 days. You  will wear the monitor at all times, except when bathing.  HOW TO USE YOUR CARDIAC EVENT MONITOR A technician will prepare your chest for the electrode placement. The  technician will show you how to place the electrodes, how to work the monitor, and how to replace the batteries. Take time to practice using the monitor before you leave the office. Make sure you understand how to send the information from the monitor to your health care provider. This requires a telephone with a landline, not a cell phone. You need to:  Wear your monitor at all times, except when you are in water:  Do not get the monitor wet.  Take the monitor off when bathing. Do not swim or use a hot tub with it on.  Keep your skin clean. Do not put body lotion or moisturizer on your chest.  Change the electrodes daily or any time they stop sticking to your skin. You might need to use tape to keep them on.  It is possible that your skin under the electrodes could become irritated. To keep this from happening, try to put the electrodes in slightly different places on your chest. However, they must remain in the area under your left breast and in the upper right section of your chest.  Make sure the monitor is safely clipped to your clothing or in a location close to your body that your health care provider recommends.  Press the button to record when you feel symptoms of heart trouble, such as dizziness, weakness, light-headedness, palpitations, thumping, shortness of breath, unexplained weakness, or a fluttering or racing heart. The monitor is always on and records what happened slightly before you pressed the button, so do not worry about being too late to get good information.  Keep a diary of your activities, such as walking, doing chores, and taking medicine. It is especially important to note what you were doing when you pushed the button to record your symptoms. This will help your health care provider determine what might be contributing to your symptoms. The information stored in your monitor will be reviewed by your health care provider alongside your diary entries.  Send the recorded  information as recommended by your health care provider. It is important to understand that it will take some time for your health care provider to process the results.  Change the batteries as recommended by your health care provider. SEEK IMMEDIATE MEDICAL CARE IF:   You have chest pain.  You have extreme difficulty breathing or shortness of breath.  You develop a very fast heartbeat that persists.  You develop dizziness that does not go away.  You faint or constantly feel you are about to faint. Document Released: 07/10/2008 Document Revised: 02/15/2014 Document Reviewed: 03/30/2013 [facility] Patient Information 2015 [place], [place]. This information is not intended to replace advice given to you by your health care provider. Make sure you discuss any questions you have with your health care provider.

## 2014-06-28 ENCOUNTER — Ambulatory Visit (HOSPITAL_COMMUNITY): Payer: MEDICARE

## 2014-06-28 NOTE — Progress Notes (Addendum)
Physical Therapy Treatment Patient Details Name: Zachary Ali MRN: [id number] DOB: [birth year] Today's Date: 06/28/2014    History of Present Illness 80 y.o. male presenting with difficulty with speech and a mild right hemiparesis.  Suspect and acute left brain ischemic event.  Head CT reviewed and unremarkable.  Patient presented outside of the time window and therefore not a tPA candidate.   MRI also negative. Further workup underway.  Pt did end up being (+) for UTI.      PT Comments    Pt continues to require min assist with all his mobility (even getting EOB) and continues to demonstrate significant deficits in safety awareness and awareness of deficits.  Cues while walking are not carried over at all and pt needs to use the RW to prevent falls during gait.  For pt to use the walker safely, though he needs min assist of him and min assist to help manage the RW as well as constant verbal, tactile, and manual cues.  His wife watched my entire session and broke down into tears at the end stating, "I can't handle you like this"  RN made aware, MD paged.  Pt will need SNF placement at this time as he is a significant fall risk if he returns home in his current state with his elderly wife (who uses a cane herself).  See further details below (in general comments) as to why SNF is necessary for both the pt's and his wife's safety.  PT will continue to follow acutely.    Follow Up Recommendations  SNF     Equipment Recommendations   (RW and shower chair delivered today)    Recommendations for Other Services   NA     Precautions / Restrictions Precautions Precautions: Fall Precaution Comments: pt is at high fall risk between physical weakness and cognitive safety.     Mobility  Bed Mobility Overal bed mobility: Needs Assistance Bed Mobility: Supine to Sit     Supine to sit: Min assist     General bed mobility comments: Min assist with bedrail for leverage.  Pt needed extra time to  complete task and relied heavily on upper extremity support and trunkal support to complete the task as well.   Transfers Overall transfer level: Needs assistance Equipment used: Rolling walker (2 wheeled) Transfers: Sit to/from Stand Sit to Stand: Min assist Stand pivot transfers: Min assist       General transfer comment: Min assist to support trunk after multiple rocking attempts (using momentum) to get to standing.  Pt with tendancy towards posterior LOB with every attempt.  He was also pulling heavily on the RW and without therapist's stabilization of his trunk and the RW he would go crashing back down to the bed.    Ambulation/Gait Ambulation/Gait assistance Min assist  Ambulation Distance (Feet) 120 Feet  Assistive device Rolling walker (2 wheeled)  Gait Pattern/deviations Step-through pattern;Shuffle  Gait velocity too fast to be safe.  Pt is not able to slow his speed without manual assist from the therapist.   General Gait Details Max constantly repetitive cues to slow down, stay closer to the RW, and for upright posture.  Pt min assist to support trunk over weak legs, to almost act as a break on the RW (pt would push it too far ahead and risked falling forward as his COG was getting too far ahead of his BOS), and while turning to keep pt inside of the RW as he would get his entire  body outside of it while attempting to turn, kick it and need min assist to right the RW, keep the pt balanced, and support his trunk.   Stairs Yes  Stairs assistance Min assist  Stair Management Two rails;Alternating pattern;Forwards  Number of Stairs 5  General stair comments Pt could not tell me how many steps and if his steps had railings at home.  When asked, " Do your stairs at home have railings?" the pt responded with, "Well, most of the time" again, demonstrating his lack of intact cognition.  Verbal cues for safety as pt started ascending the stairs with reciprocal pattern over flexed knees  almost tripping up the stairs as he did not realize he did not have the strength to preform the stairs this way currently.  If he did not have both hands on the handrails he would have fallen to his face.           Modified Rankin (Stroke Patients Only) Modified Rankin (Stroke Patients Only) Pre-Morbid Rankin Score: No symptoms Modified Rankin: Moderately severe disability     Balance Overall balance assessment: Needs assistance Sitting-balance support: Feet supported;No upper extremity supported;Single extremity supported;Feet unsupported Sitting balance-Leahy Scale: Fair Sitting balance - Comments: pt with tendancy towards posterior LOB while doing things like putting on his locks and shoes, donning his underwear seated.  Postural control: Posterior lean Standing balance support: No upper extremity supported;Single extremity supported;During functional activity Standing balance-Leahy Scale: Poor Standing balance comment: Pt needs external assist to maintain balance while urinating, standing to pull up his underwear and pants.  He needed max verbal cues for safety while attempting to dress (he tried to put his undershirt on in standing and fell backwards onto the bed).                      Cognition Arousal/Alertness: Awake/alert Behavior During Therapy: WFL for tasks assessed/performed Overall Cognitive Status: Impaired/Different from baseline Area of Impairment: Attention;Memory;Safety/judgement;Awareness;Problem solving   Current Attention Level: Selective Memory: Decreased short-term memory (no carryover of precautions and cues) Following Commands: Follows one step commands with increased time Safety/Judgement: Decreased awareness of safety;Decreased awareness of deficits Awareness: Emergent ("I'm falling", but can't apply that to his own safety/decisi) Problem Solving: Slow processing General Comments: Cues to focus on task at hand.  Repeated constant cues for safe  use of RW.  Cues for safety (sit to dress instead of stand) during apllication of clothes.  Cues to slow down during gait.  At times throughout the session pt with language of confusion.  One statement made, "My wife always accuses me of being hard of hearing when there are all of these men around" (no men at all in the room or in the hallway at that time.   When asked questions he would not answer the actual question asked.  He seems to have expressive aphasia at times, because when corrected he seems to agree with what I state to him like that is what he means to say, but he has no idea that what he is saying is not right.        General Comments General comments (skin integrity, edema, etc.): Pt had multiple (>5) trips to the bathroom during our session.  Each of them he had significant urgency to go which made him increase his speed, try not to take the RW, and generally race to the bathroom at an unsafe speed.  3/5 times he had incontinence of urine before we got  to the bathroom and only one of those times did he actually urinate at all vs a few drops).  At this speed and generally with this disreguard of safety/use of the RW he would be a significant safety and fall risk at home.  After observing the entire session, his wife was in tears (she is walking with the cane and could hardly keep up with [place] walking with the RW in the hallway) stating, "I cannot take care of you like this.  I just can't do it!"  RN made aware and MD paged.  All previous therapy notes indicated if mental status and physical status improved and pt had 24/7 assist, he could d/c safely home.  Now that we have seen him again, he continues to have significant safety issues, urinary urgency and frequency, and need physical assist (which at best his wife could provide supervision-today is the first day she has seen him move and the first day the staff has had eyes on her physical abilities).        Pertinent Vitals/Pain Pain  Assessment: No/denies pain           PT Goals (current goals can now be found in the care plan section) Acute Rehab PT Goals Patient Stated Goal: to go home and play golf.  Progress towards PT goals: Progressing toward goals    Frequency  Min 2X/week    PT Plan Discharge plan needs to be updated       End of Session Equipment Utilized During Treatment: Gait belt Activity Tolerance: Patient tolerated treatment well Patient left: in bed;with call bell/phone within reach;with bed alarm set;with family/visitor present (seated EOB. )     Time: 1884-1660 PT Time Calculation (min): 53 min  Charges:  $Gait Training: 8-22 mins $Therapeutic Activity: 38-52 mins                      Kairah Leoni B. Verdie Barrows, PT, DPT [phone]   06/28/2014, 3:18 PM

## 2014-06-28 NOTE — Clinical Social Work Psychosocial (Signed)
Clinical Social Work Department BRIEF PSYCHOSOCIAL ASSESSMENT 06/28/2014  Patient:  Zachary Ali, ABERNETHY     Account Number:  [id number]     Admit date:  06/25/2014  Clinical Social Worker:  Marciano Sequin  Date/Time:  06/28/2014 03:30 PM  Referred by:  Physician  Date Referred:  06/28/2014 Referred for  SNF Placement   Other Referral:   Interview type:  Family Other interview type:    PSYCHOSOCIAL DATA Living Status:  WIFE Admitted from facility:   Level of care:  [place] Primary support name:  [facility] Primary support relationship to patient:  SPOUSE Degree of support available:   Good    CURRENT CONCERNS  Other Concerns:   Not being able to care for him at home    [place] / [place] CSW met pt and pt's wife at bedside. CSW introduce self and purpose of visit. Pt presented was sleep. Pt's wife reported that she can not care for the pt at home. "that's why I'm asking for a SNF now, I wanted to take him home, but I just can not do it." CSW explained the SNF process to the pt.  CSW provided the pt with contact information for further questions.    Assessment/plan status:  Information/Referral to [employer]  Other assessment/ plan:  Information/referral to community resources:    PATIENT'S/FAMILY'S RESPONSE TO PLAN OF CARE:  Agree   Assessment/plan status:  Information/Referral to [employer] Other assessment/ plan:   Information/referral to community resources:   SNF list    PATIENT'S/FAMILY'S RESPONSE TO PLAN OF CARE: agree     Greta Doom, MSW, [place]

## 2014-06-28 NOTE — Progress Notes (Signed)
Talked to patient about home health care choices; patient requested [facility]; [facility] with [facility] called for arrangements; patient stated that he does not need a a rolling walker or a shower chair; Abelino Derrick RN,BSN,MHA [phone]

## 2014-06-28 NOTE — Progress Notes (Signed)
[facility]                                                                              Patient Demographics  Zachary Ali, is a 81 y.o. male, DOB - July 14, 1929, ZOX:[id number]  Admit date - 06/25/2014   Admitting Physician Doree Albee, MD  Outpatient Primary MD for the patient is Georgann Housekeeper, MD  LOS - 3   Chief Complaint  Patient presents with  . Altered Mental Status      HPI on 06/25/2014 This is a 80 y.o. year old male with significant past medical history of seizure disorder, hx/o SVT, ? Cerebral aneurysm 50-60 years ago who presented with encephalopathy, CVA. Wife reported that patient had worsening confusion, slurred speech, difficulty walking over past 3-4 days. Patient works as a Environmental education officer. Denied any recent falls. No recent seizure activity. Stated that this has been well controlled.  Presented to the ER afebrile, hemodynamically stable. BP 120s-130s. Satting >97% on RA. WBC mildly elevated at 10.7. hgb 13.1, Cr 0.84. CXR WNL. Head CT with age related changes, but no acute findings. Wife also reported behavior change. Noted to be uncooperative with nurses in ER. EKG NSR. Trop neg x 1. Pro BNP @ 953.    Interim history Patient's wife finally arrived to pick patient however does not feel comfortable with patient going home.  Patient is ALERT AND ORIENTED.  According to physical therapy he lacks safety awareness.  Due to this, patient's discharge will be held and will proceed with looking for SNF.  Social work consulted.  Pending updated physical therapy note.  Assessment & Plan   Acute Encephalopathy -Possibly secondary to Questionable TIA vs CVA versus infectious etiology -Patient back at baseline. -CT of the head: No acute intracranial findings or mass lesions. -MRI of the brain:No acute intracranial infarct or other abnormality identified. -Echocardiogram: EF of 60-70%, grade 1 diastolic dysfunction, no cardiac source of embolism -Carotid Doppler: 1-39%  ICA stenosis. Vertebral artery flow is antegrade.  -LDL 66,  A1c 5.8 -Neurology consulted and appreciated, recommend a 30 day event monitor -Cardiology will arrange for cardiac monitor -PT consulted, recommended home health physical therapy, however, due to patient lacking safety awareness, will seek SNF placement. -Possibly secondary to UTI -Chest x-ray negative, ammonia 20  Urinary tract infection -UA shows WBC TNTC, large leukocytes -Pending urine culture -Continue ceftriaxone  History of seizure disorder -Supposedly controlled -Dilantin and Depakote levels were low, patient was given loading dose by neurology. -Repeat levels were normal -EEG cancelled by Neurology -Continue Dilantin and Depakote along with seizure precautions -Patient was given additional loading doses.   History of SVT -Currently in sinus rhythm, continue telemetry monitoring -Neurology recommended 30 day event monitor  Elevated BNP, diastolic dysfunction -Currently compensated, does not appear to be in exacerbation -Patient does have some edema in the lower extremities -No known history of congestive heart failure -Echocardiogram: EF 60-70%, grade 1 diastolic dysfunction -Will monitor daily weights, intake and output  Thrombocytopenia -Lovenox discontinued and continue to monitor CBC  Code Status: Full  Family Communication: None at bedside  Disposition Plan: Admitted.  Pending SNF placement  Time Spent in minutes   30 minutes  Procedures  Carotid Doppler: 1-39% ICA stenosis. Vertebral artery flow is antegrade.   Echocardiogram Study Conclusions - Left ventricle: The cavity size was normal. Systolic function was vigorous. The estimated ejection fraction was in the range of 65% to 70%. Wall motion was normal; there were no regional wall motion abnormalities. Doppler parameters are consistent with abnormal left ventricular relaxation (grade 1 diastolic dysfunction). Doppler parameters are  consistent with high ventricular filling pressure. - Right ventricle: The cavity size was moderately dilated. Wall thickness was normal. Systolic function was mildly reduced.  - Right atrium: The atrium was moderately dilated. Impressions: No cardiac source of emboli was indentified.  Consults   Neurology  DVT Prophylaxis  SCDs  Lab Results  Component Value Date   PLT 82* 06/27/2014    Medications  Scheduled Meds: .  stroke: mapping our early stages of recovery book   Does not apply Once  . aspirin EC  81 mg Oral Daily  . cefTRIAXone (ROCEPHIN)  IV  1 g Intravenous Q24H  . divalproex  500 mg Oral TID  . metoprolol succinate  25 mg Oral Daily  . phenytoin  100 mg Oral TID   Continuous Infusions:  PRN Meds:.senna-docusate  Antibiotics    Anti-infectives   Start     Dose/Rate Route Frequency Ordered Stop   06/27/14 0000  ciprofloxacin (CIPRO) 250 MG tablet     250 mg Oral 2 times daily 06/27/14 1119     06/26/14 1500  cefTRIAXone (ROCEPHIN) 1 g in dextrose 5 % 50 mL IVPB     1 g 100 mL/hr over 30 Minutes Intravenous Every 24 hours 06/26/14 1342          Subjective:   Zachary Ali seen and examined today. Patient stated he would like to go home and that he has many things to do.  He denies chest pain, dizziness, headache, shortness of breath.  Objective:   Filed Vitals:   06/28/14 0500 06/28/14 0558 06/28/14 0927 06/28/14 1341  BP:  153/89 132/55 140/69  Pulse:  68 68 73  Temp:  97.9 F (36.6 C) 97.7 F (36.5 C) 98.1 F (36.7 C)  TempSrc:  Oral Oral Oral  Resp:  Height:      Weight: 79.2 kg (174 lb 9.7 oz)     SpO2:  99% 99% 99%    Wt Readings from Last 3 Encounters:  06/28/14 79.2 kg (174 lb 9.7 oz)  08/25/13 91.173 kg (201 lb)     Intake/Output Summary (Last 24 hours) at 06/28/14 1515 Last data filed at 06/28/14 0905  Gross per 24 hour  Intake    480 ml  Output    850 ml  Net   -370 ml    Exam  General: Well developed, well  nourished, NAD, appears stated age  HEENT: NCAT,  mucous membranes moist.   Cardiovascular: S1 S2 auscultated, no rubs, murmurs or gallops. Regular rate and rhythm.  Respiratory: Clear to auscultation bilaterally with equal chest rise  Abdomen: Soft, nontender, nondistended, + bowel sounds  Extremities: warm dry without cyanosis clubbing. + Edema  Neuro: AAOx3, no focal deficits   Psych: Normal affect and demeanor with intact judgement and insight  Data Review   Micro Results No results found for this or any previous visit (from the past 240 hour(s)).  Radiology Reports Dg Chest 2 View  06/25/2014   CLINICAL DATA:  Cough.  EXAM: CHEST  2 VIEW  COMPARISON:  04/08/2013.  FINDINGS: The cardiac  silhouette, mediastinal and hilar contours are within normal limits and stable. There is tortuosity and calcification of the thoracic aorta. The lungs are clear. No pleural effusion. The bony thorax is intact.  IMPRESSION: No acute cardiopulmonary findings.   Electronically Signed   By: Loralie Champagne M.D.   On: 06/25/2014 19:14   Ct Head Wo Contrast  06/25/2014   CLINICAL DATA:  Slurred speech and difficulty walking.  EXAM: CT HEAD WITHOUT CONTRAST  TECHNIQUE: Contiguous axial images were obtained from the base of the skull through the vertex without intravenous contrast.  COMPARISON:  06/24/2006.  FINDINGS: Stable age related cerebral atrophy, ventriculomegaly and periventricular white matter disease. 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 No extra-axial fluid collections are identified. No CT findings for acute hemispheric infarction or intracranial hemorrhage. No mass lesions. The brainstem and cerebellum are normal.  The bony structures are intact. The paranasal sinuses and mastoid air cells are clear. The globes are intact.  IMPRESSION: Age related cerebral atrophy, ventriculomegaly and periventricular white matter disease.  No acute intracranial findings or mass lesions.    Electronically Signed   By: Loralie Champagne M.D.   On: 06/25/2014 18:28   Mr Brain Wo Contrast  06/25/2014   CLINICAL DATA:  Mild right hemi paresis, speech difficulty.  EXAM: MRI HEAD WITHOUT CONTRAST  MRA HEAD WITHOUT CONTRAST  TECHNIQUE: Multiplanar, multiecho pulse sequences of the brain and surrounding structures were obtained without intravenous contrast. Angiographic images of the head were obtained using MRA technique without contrast.  COMPARISON:  None.  Prior CT from earlier the same day.  FINDINGS: MRI HEAD FINDINGS  Diffuse prominence of the CSF containing spaces is compatible with age-related generalized cerebral atrophy. Minimal T2/FLAIR hyperintensity seen within the periventricular and deep white matter, within normal limits for patient age.  No diffusion-weighted signal abnormality to suggest acute intracranial infarct. Gray-white matter differentiation well maintained. Normal flow voids seen within the intracranial vasculature. There is no intracranial hemorrhage. Few punctate chronic micro hemorrhages noted within the left frontal lobe, which may be related to underlying hypertension.  No mass lesion or midline shift. Mild ventricular prominence related at global atrophy present without hydrocephalus. No extra-axial fluid collection.  Craniocervical junction widely patent. Pituitary gland within normal limits. No acute abnormality seen about either orbit. Lens replacement noted at the left orbit.  Calvarium and scalp soft tissues demonstrate a normal appearance. Mild multilevel degenerative spondylolysis noted within the upper cervical spine.  Paranasal sinuses and mastoid air cells are clear.  MRA HEAD FINDINGS  ANTERIOR CIRCULATION:  Study is degraded by motion artifact.  Visualized portions of the distal atherosclerotic irregularity present within the cavernous carotid arteries bilaterally without hemodynamically significant stenosis. Supra clinoid segments well opacified.  Multi focal  atherosclerotic irregularity noted within the A1 segments without significant stenosis. Anterior communicating artery and anterior cerebral arteries are within normal limits.  Moderate multi focal atherosclerotic irregularity present within the M1 segments bilaterally. There is a severe stenosis within the distal right M1 segment. Stenosis measures approximately 3 mm in length. The right MCA branches are well opacified distally. Left MCA branches are normal.  No aneurysm within the anterior circulation.  POSTERIOR CIRCULATION:  Vertebral arteries are codominant without significant stenosis or occlusion. Posterior inferior cerebral arteries not well visualized. Vertebrobasilar junction and basilar artery are widely patent with antegrade flow. Proximal superior cerebral arteries are widely patent. Fetal origin of the left PCA noted. Posterior lower cerebral arteries are opacified without hemodynamically significant stenosis.  IMPRESSION:  MRI HEAD IMPRESSION:  1. No acute intracranial infarct or other abnormality identified. 2. Moderate generalized cerebral atrophy.  MRA HEAD IMPRESSION:  1. Severe short-segment stenosis within the mid -distal right M1 segment. No other hemodynamically significant stenosis or proximal branch occlusion identified within the intracranial circulation. 2. Moderate multi focal atherosclerotic irregularity within the cavernous carotid arteries and middle cerebral arteries bilaterally. 3. Fetal origin of the left PCA.   Electronically Signed   By: Rise Mu M.D.   On: 06/25/2014 23:43   Mr Maxine Glenn Head/brain Wo Cm  06/25/2014   CLINICAL DATA:  Mild right hemi paresis, speech difficulty.  EXAM: MRI HEAD WITHOUT CONTRAST  MRA HEAD WITHOUT CONTRAST  TECHNIQUE: Multiplanar, multiecho pulse sequences of the brain and surrounding structures were obtained without intravenous contrast. Angiographic images of the head were obtained using MRA technique without contrast.  COMPARISON:  None.   Prior CT from earlier the same day.  FINDINGS: MRI HEAD FINDINGS  Diffuse prominence of the CSF containing spaces is compatible with age-related generalized cerebral atrophy. Minimal T2/FLAIR hyperintensity seen within the periventricular and deep white matter, within normal limits for patient age.  No diffusion-weighted signal abnormality to suggest acute intracranial infarct. Gray-white matter differentiation well maintained. Normal flow voids seen within the intracranial vasculature. There is no intracranial hemorrhage. Few punctate chronic micro hemorrhages noted within the left frontal lobe, which may be related to underlying hypertension.  No mass lesion or midline shift. Mild ventricular prominence related at global atrophy present without hydrocephalus. No extra-axial fluid collection.  Craniocervical junction widely patent. Pituitary gland within normal limits. No acute abnormality seen about either orbit. Lens replacement noted at the left orbit.  Calvarium and scalp soft tissues demonstrate a normal appearance. Mild multilevel degenerative spondylolysis noted within the upper cervical spine.  Paranasal sinuses and mastoid air cells are clear.  MRA HEAD FINDINGS  ANTERIOR CIRCULATION:  Study is degraded by motion artifact.  Visualized portions of the distal atherosclerotic irregularity present within the cavernous carotid arteries bilaterally without hemodynamically significant stenosis. Supra clinoid segments well opacified.  Multi focal atherosclerotic irregularity noted within the A1 segments without significant stenosis. Anterior communicating artery and anterior cerebral arteries are within normal limits.  Moderate multi focal atherosclerotic irregularity present within the M1 segments bilaterally. There is a severe stenosis within the distal right M1 segment. Stenosis measures approximately 3 mm in length. The right MCA branches are well opacified distally. Left MCA branches are normal.  No aneurysm  within the anterior circulation.  POSTERIOR CIRCULATION:  Vertebral arteries are codominant without significant stenosis or occlusion. Posterior inferior cerebral arteries not well visualized. Vertebrobasilar junction and basilar artery are widely patent with antegrade flow. Proximal superior cerebral arteries are widely patent. Fetal origin of the left PCA noted. Posterior lower cerebral arteries are opacified without hemodynamically significant stenosis.  IMPRESSION: MRI HEAD IMPRESSION:  1. No acute intracranial infarct or other abnormality identified. 2. Moderate generalized cerebral atrophy.  MRA HEAD IMPRESSION:  1. Severe short-segment stenosis within the mid -distal right M1 segment. No other hemodynamically significant stenosis or proximal branch occlusion identified within the intracranial circulation. 2. Moderate multi focal atherosclerotic irregularity within the cavernous carotid arteries and middle cerebral arteries bilaterally. 3. Fetal origin of the left PCA.   Electronically Signed   By: Rise Mu M.D.   On: 06/25/2014 23:43    CBC  Recent Labs Lab 06/25/14 1901 06/25/14 2348 06/27/14 0850  WBC 10.7* 9.7 7.3  HGB 13.1 13.0 13.1  HCT  39.1 37.5* 39.0  PLT 89* 76* 82*  MCV 97.3 95.2 94.2  MCH 32.6 33.0 31.6  MCHC 33.5 34.7 33.6  RDW 14.4 14.3 14.0  LYMPHSABS 1.4  --   --   MONOABS 1.7*  --   --   EOSABS 0.0  --   --   BASOSABS 0.0  --   --     Chemistries   Recent Labs Lab 06/25/14 1901 06/25/14 2348 06/27/14 0850  NA 137  --  137  K 3.9  --  3.8  CL 103  --  102  CO2 21  --  24  GLUCOSE 96  --  134*  BUN 22  --  22  CREATININE 0.84 0.81 0.64  CALCIUM 8.6  --  8.7  AST 23  --   --   ALT 17  --   --   ALKPHOS 61  --   --   BILITOT 0.3  --   --    ------------------------------------------------------------------------------------------------------------------ estimated creatinine clearance is 67.7 ml/min (by C-G formula based on Cr of  0.64). ------------------------------------------------------------------------------------------------------------------  Recent Labs  06/26/14 0608  HGBA1C 5.8*   ------------------------------------------------------------------------------------------------------------------  Recent Labs  06/26/14 0605  CHOL 142  HDL 55  LDLCALC 66  TRIG 106  CHOLHDL 2.6   ------------------------------------------------------------------------------------------------------------------ No results found for this basename: TSH, T4TOTAL, FREET3, T3FREE, THYROIDAB,  in the last 72 hours ------------------------------------------------------------------------------------------------------------------ No results found for this basename: VITAMINB12, FOLATE, FERRITIN, TIBC, IRON, RETICCTPCT,  in the last 72 hours  Coagulation profile No results found for this basename: INR, PROTIME,  in the last 168 hours  No results found for this basename: DDIMER,  in the last 72 hours  Cardiac Enzymes  Recent Labs Lab 06/25/14 1902  TROPONINI <0.30   ------------------------------------------------------------------------------------------------------------------ No components found with this basename: POCBNP,     Charron Coultas D.O. on 06/28/2014 at 3:15 PM  Between 7am to 7pm - Pager - [phone]  After 7pm go to [email or web address] - password [email or web address]  And look for the night coverage person covering for me after hours  [facility] Office  [phone]

## 2014-06-28 NOTE — Clinical Social Work Placement (Addendum)
Clinical Social Work Department CLINICAL SOCIAL WORK PLACEMENT NOTE 06/28/2014  Patient:  Zachary Ali, Zachary Ali  Account Number:  [id number] Admit date:  06/25/2014  Clinical Social Worker:  Ashok Cordia Klyn Kroening, LCSWA  Date/time:  06/28/2014 04:33 PM  Clinical Social Work is seeking post-discharge placement for this patient at the following level of care:   SKILLED NURSING   (*CSW will update this form in Epic as items are completed)   06/28/2014  Patient/family provided with [facility] Department of Clinical Social Work's list of facilities offering this level of care within the geographic area requested by the patient (or if unable, by the patient's family).  06/28/2014  Patient/family informed of their freedom to choose among providers that offer the needed level of care, that participate in Medicare, Medicaid or managed care program needed by the patient, have an available bed and are willing to accept the patient.  06/28/2014  Patient/family informed of [facility]' ownership interest in [facility], as well as of the fact that they are under no obligation to receive care at this facility.  PASARR submitted to EDS on 06/28/2014 PASARR number received on 06/28/2014  FL2 transmitted to all facilities in geographic area requested by pt/family on  06/28/2014 FL2 transmitted to all facilities within larger geographic area on 06/28/2014  Patient informed that his/her managed care company has contracts with or will negotiate with  certain facilities, including the following:    Patient/family informed of bed offers received: 06/29/2014 Patient chooses bed at [facility]  Physician recommends and patient chooses bed at    Patient to be transferred to [facility] on  06/29/2014 Patient to be transferred to facility by [facility] Patient and family notified of transfer on 06/29/2014 Name of family member notified:  Zachary Ali, wife  The following physician request were entered in  Epic:   Additional Comments:  Keylen Uzelac, MSW, [employer] [phone]

## 2014-06-28 NOTE — Progress Notes (Signed)
Patient was discharged yesterday however did not leave as he was unable to get home.  Discharge still stands, no change.  Called wife again, she will be here at 60 (athough, she was supposed to come yesterday around the same time).   Time spent: 10 minutes  Jerlyn Pain D.O. [facility] Pager [phone]  If 7PM-7AM, please contact night-coverage [email or web address] Password [facility] 06/28/2014, 9:37 AM

## 2014-06-28 NOTE — Evaluation (Signed)
Speech Language Pathology Evaluation Patient Details Name: Zachary Ali MRN: [id number] DOB: 1929-06-11 Today's Date: 06/28/2014 Time: 5621-3086 SLP Time Calculation (min): 18 min  Problem List:  Patient Active Problem List   Diagnosis Date Noted  . Altered mental state 06/25/2014  . CVA (cerebral infarction) 06/25/2014  . Generalized nonconvulsive epilepsy without mention of intractable epilepsy 08/25/2013   Past Medical History:  Past Medical History  Diagnosis Date  . Seizure     since childhood, Dr. Debarah Crape; off phenoparb since 2011, after many yr, doing ok  . Small vessel disease   . Allergic rhinitis   . BPH (benign prostatic hypertrophy)     seen urology, Dr. Earlene Plater  . SVT (supraventricular tachycardia)   . Elevated PSA     dr. Earlene Plater PSA 3-6; no PSA-urology f/u for exam yrly  . Mild depression   . Dyslipidemia     diet control  . Edema leg     hx of  . Insomnia   . OA (osteoarthritis) of knee     left   . Bronchitis     episode of 2/10  . SOB (shortness of breath)   . Palpitations     w/u with Card-Dr. Anne Fu  . Paroxysmal supraventricular tachycardia    Past Surgical History:  Past Surgical History  Procedure Laterality Date  . Vasectomy    . US echocardiography      ok; stress test ok-05-2013 nl LVF, tr AR  . Hemorrhoid surgery    . Cataract extraction      2011-12   HPI:  80 y.o. male presenting with difficulty with speech and a mild right hemiparesis.  Suspect and acute left brain ischemic event.  Head CT reviewed and unremarkable.  Patient presented outside of the time window and therefore not a tPA candidate.   MRI also negative. Further workup underway.  Pt did end up being (+) for UTI.     Assessment / Plan / Recommendation Clinical Impression  Limited cognitive-linguistic evaluation secondary to patient participation reveals decreased sustained attention, basic problem solving, and recall of new information. Pt has intellectual awareness of current  impairments, but requires Mod A from SLP for emergent awareness, and Max A from SLP for anticipatory awareness. Pt demonstrates intermittent word-finding difficulty in conversation, which SLP suspects is related to current mentation. Pt would benefit from SLP services to maximize functional communication and independence.    SLP Assessment  Patient needs continued Speech Lanaguage Pathology Services    Follow Up Recommendations  Skilled Nursing facility;24 hour supervision/assistance    Frequency and Duration min 2x/week  2 weeks   Pertinent Vitals/Pain Pain Assessment: No/denies pain   SLP Goals  SLP Goals Potential to Achieve Goals: Good Potential Considerations: Ability to learn/carryover information;Cooperation/participation level Progress/Goals/Alternative treatment plan discussed with pt/caregiver and they: Agree  SLP Evaluation Prior Functioning  Cognitive/Linguistic Baseline: Information not available Type of Home: House  Lives With: Spouse   Cognition  Overall Cognitive Status: No family/caregiver present to determine baseline cognitive functioning Arousal/Alertness: Lethargic Orientation Level: Other (comment) (would not answer orientation questions) Attention: Sustained Sustained Attention: Impaired Sustained Attention Impairment: Verbal basic;Functional basic Memory: Impaired Memory Impairment: Decreased recall of new information Awareness: Impaired Awareness Impairment: Anticipatory impairment;Emergent impairment Problem Solving: Impaired Problem Solving Impairment: Verbal basic;Functional basic Behaviors: Poor frustration tolerance Safety/Judgment: Impaired    Comprehension  Auditory Comprehension Overall Auditory Comprehension: Impaired Commands: Impaired One Step Basic Commands: 50-74% accurate Conversation: Simple Interfering Components: Attention Visual Recognition/Discrimination Discrimination: Not tested  Reading Comprehension Reading Status: Not  tested    Expression Expression Primary Mode of Expression: Verbal Verbal Expression Overall Verbal Expression: Impaired Initiation: No impairment Level of Generative/Spontaneous Verbalization: Conversation Naming: Impairment Other Naming Comments: anomia noted in conversation Verbal Errors: Not aware of errors Interfering Components: Attention Non-Verbal Means of Communication: Not applicable Written Expression Written Expression: Not tested   Oral / Motor Motor Speech Overall Motor Speech: Appears within functional limits for tasks assessed   GO      Maxcine Ham, M.A. CCC-SLP [phone]  Maxcine Ham 06/28/2014, 4:16 PM

## 2014-06-29 ENCOUNTER — Inpatient Hospital Stay (HOSPITAL_COMMUNITY): Payer: Medicare Other

## 2014-06-29 DIAGNOSIS — R339 Retention of urine, unspecified: Secondary | ICD-10-CM

## 2014-06-29 LAB — CBC
HCT: 41.2 % (ref 39.0–52.0)
HEMOGLOBIN: 13.7 g/dL (ref 13.0–17.0)
MCH: 31.5 pg (ref 26.0–34.0)
MCHC: 33.3 g/dL (ref 30.0–36.0)
MCV: 94.7 fL (ref 78.0–100.0)
Platelets: 118 10*3/uL — ABNORMAL LOW (ref 150–400)
RBC: 4.35 MIL/uL (ref 4.22–5.81)
RDW: 13.8 % (ref 11.5–15.5)
WBC: 7.9 10*3/uL (ref 4.0–10.5)

## 2014-06-29 MED ORDER — TAMSULOSIN HCL 0.4 MG PO CAPS: 0.4000 mg | ORAL_CAPSULE | Freq: Every day | ORAL | Status: DC

## 2014-06-29 MED ORDER — TAMSULOSIN HCL 0.4 MG PO CAPS
0.4000 mg | ORAL_CAPSULE | Freq: Every day | ORAL | Status: DC
  Administered 2014-06-29: 0.4 mg via ORAL
  Filled 2014-06-29: qty 1

## 2014-06-29 NOTE — Progress Notes (Signed)
Pt had a condom cath on but was still insisting to get up and go to the bathroom to void. Around 0315, he had an output of about 100 cc in the urine bag. Pt was encouraged to go ahead and void since he had cath which I explained to him again. Just about a total of 50 cc more is all he could put out with discomfort. Bladder scan was done with results of 999 ml. A message was sent to NP on call as I went ahead and stood pt up at bedside to encourage voiding. With pt still unable to void, I decided to in and out cath the pt. 87F was used with return of blood and no urine. NP then gave me an order to place a foley cath which was done with an initial straight flow of 900 cc. I then clamped for about and then unclamped the foley. Pt felt comfortable and fell asleep. Will cont to monitor.

## 2014-06-29 NOTE — Discharge Summary (Addendum)
Physician Discharge Summary  Zachary Ali JXB:[id number] DOB: [birth year] DOA: 06/25/2014  PCP: Zachary Housekeeper, MD  Admit date: 06/25/2014 Discharge date: 06/29/2014  Time spent: 45 minutes  Recommendations for Outpatient Follow-up:  Patient will be discharged to [facility]. He should continue physical therapy as well as occupational therapy as recommended by the nursing facility. Patient to continue his medications as prescribed. Patient should follow a heart healthy diet. Patient will need followup with his primary care physician as well as neurologist, Dr. Berneice Ali, within one week of discharge.  Discharge Diagnoses:  Acute encephalopathy  Urinary tract infection  History of seizure disorder  History of SVT  Elevated BNP/diastolic dysfunction  Thrombocytopenia Urinary retention  Discharge Condition: Stable  Diet recommendation: heart healty  Filed Weights   06/27/14 0500 06/28/14 0500 06/29/14 1308  Weight: 76.8 kg (169 lb 5 oz) 79.2 kg (174 lb 9.7 oz) 76.6 kg (168 lb 14 oz)    History of present illness:  on 06/25/2014  This is a 80 y.o. year old male with significant past medical history of seizure disorder, hx/o SVT, ? Cerebral aneurysm 50-60 years ago who presented with encephalopathy, CVA. Wife reported that patient had worsening confusion, slurred speech, difficulty walking over past 3-4 days. Patient works as a Environmental education officer. Denied any recent falls. No recent seizure activity. Stated that this has been well controlled.  Presented to the ER afebrile, hemodynamically stable. BP 120s-130s. Satting >97% on RA. WBC mildly elevated at 10.7. hgb 13.1, Cr 0.84. CXR WNL. Head CT with age related changes, but no acute findings. Wife also reported behavior change. Noted to be uncooperative with nurses in ER. EKG NSR. Trop neg x 1. Pro BNP @ 953.   Hospital Course:  Acute Encephalopathy  -Possibly secondary to Questionable TIA vs CVA versus infectious etiology  -CT  of the head: No acute intracranial findings or mass lesions.  -MRI of the brain:No acute intracranial infarct or other abnormality identified.  -Echocardiogram: EF of 60-70%, grade 1 diastolic dysfunction, no cardiac source of embolism  -Carotid Doppler: 1-39% ICA stenosis. Vertebral artery flow is antegrade.  -LDL 66, A1c 5.8  -Neurology consulted and appreciated, recommend a 30 day event monitor  -Will speak to cardiology regarding the monitor  -PT consulted, recommended home health physical therapy, however, due to patient lacking safety awareness, will seek SNF placement. -Possibly secondary to UTI  -Chest x-ray negative, ammonia 20   Urinary tract infection  -UA shows WBC TNTC, large leukocytes  -Pending urine culture and can be followed up by her primary care physician  -Will place patient on ciprofloxacin   History of seizure disorder  -Supposedly controlled  -Dilantin and Depakote levels were low, patient was given loading dose by neurology.  -Repeat levels this morning were normal  -EEG cannot be done until 9/14, patient wishes to go home. Spoke with Dr. Roda Ali, who recommended giving patient additional loads of Dilantin and Depakote before discharge and that since patient did not appear to be having seizures, he can be discharged without EEG and can follow up with his neurologist or PCP.  -Continue Dilantin and Depakote along with seizure precautions   History of SVT  -Currently in sinus rhythm, continue telemetry monitoring  -Neurology recommended 30 day event monitor   Elevated BNP, diastolic dysfunction  -Currently compensated, does not appear to be in exacerbation  -Patient does have some edema in the lower extremities  -No known history of congestive heart failure  -Echocardiogram: EF 60-70%, grade 1 diastolic  dysfunction  -Monitored daily weights, intake and output   Thrombocytopenia  -Will d/c lovenox and continue to monitor CBC   Urinary rentention -Overnight,  patient required catheterization of the bladder scan showed almost 1000 cc.  -Spoke with urology, Dr. Berneice Ali, via phone -Patient on Flomax -Patient may be discharged with catheter in place, and one week for followup with Dr. Berneice Ali  Procedures: Carotid Doppler: 1-39% ICA stenosis. Vertebral artery flow is antegrade.   Echocardiogram  Study Conclusions - Left ventricle: The cavity size was normal. Systolic function was vigorous. The estimated ejection fraction was in the range of 65% to 70%. Wall motion was normal; there were no regional wall motion abnormalities. Doppler parameters are consistent with abnormal left ventricular relaxation (grade 1 diastolic dysfunction). Doppler parameters are consistent with high ventricular filling pressure. - Right ventricle: The cavity size was moderately dilated. Wall thickness was normal. Systolic function was mildly reduced.  - Right atrium: The atrium was moderately dilated. Impressions: No cardiac source of emboli was indentified.  Renal US: Normal kidneys. No hydronephrosis.  Consultations: Neurology Urology, Dr. Berneice Ali, via phone  Discharge Exam: Filed Vitals:   06/29/14 0936  BP: 156/63  Pulse: 69  Temp: 97.9 F (36.6 C)  Resp: 20   Exam  General: Well developed, well nourished, NAD, appears stated age  HEENT: NCAT, mucous membranes moist.  Cardiovascular: S1 S2 auscultated, no rubs, murmurs or gallops. Regular rate and rhythm.  Respiratory: Clear to auscultation bilaterally with equal chest rise  Abdomen: Soft, nontender, nondistended, + bowel sounds  Extremities: warm dry without cyanosis clubbing. + Edema  Neuro: AAOx3, no somatic dysfunctions  Psych: Normal affect and demeanor with intact judgement and insight  Discharge Instructions      Discharge Instructions   Discharge instructions    Complete by:  As directed   Patient will be discharged to nursing facility. He should continue physical therapy as well as occupational  therapy as recommended by the nursing facility. Patient to continue his medications as prescribed. Patient should follow a heart healthy diet. Patient will need followup with his primary care physician as well as neurologist, Dr. Berneice Ali, within one week of discharge.            Medication List         aspirin 81 MG EC tablet  Take 1 tablet (81 mg total) by mouth daily.     ciprofloxacin 250 MG tablet  Commonly known as:  CIPRO  Take 1 tablet (250 mg total) by mouth 2 (two) times daily.     divalproex 500 MG DR tablet  Commonly known as:  DEPAKOTE  Take 500 mg by mouth 3 (three) times daily.     metoprolol succinate 25 MG 24 hr tablet  Commonly known as:  TOPROL-XL  Take 25 mg by mouth daily.     phenytoin 100 MG ER capsule  Commonly known as:  DILANTIN  Take 1 capsule (100 mg total) by mouth 3 (three) times daily.     tamsulosin 0.4 MG Caps capsule  Commonly known as:  FLOMAX  Take 1 capsule (0.4 mg total) by mouth daily.       No Known Allergies Follow-up Information   Follow up with Neurologist. Schedule an appointment as soon as possible for a visit in 1 week. [facility] follow up)       Follow up with Zachary Housekeeper, MD. Schedule an appointment as soon as possible for a visit in 1 week. [facility] follow up)  Specialty:  Internal Medicine   Contact information:   [address] [place] [address] [phone]       Follow up with [facility]). (office will call yopu about monitor)    Specialty:  Cardiology   Contact information:   [address] [place] [address] [phone]      Follow up with [facility]. (The home care agency will contact you for a start up date and time)    Contact information:   [address] [place] [address] [phone]       Follow up with Sebastian Ache, MD. Schedule an appointment as soon as possible for a visit in 1 week. [facility]  followup, Urinary retention)    Specialty:  Urology   Contact information:   [address] [place] [address] [phone]        The results of significant diagnostics from this hospitalization (including imaging, microbiology, ancillary and laboratory) are listed below for reference.    Significant Diagnostic Studies: Dg Chest 2 View  06/25/2014   CLINICAL DATA:  Cough.  EXAM: CHEST  2 VIEW  COMPARISON:  04/08/2013.  FINDINGS: The cardiac silhouette, mediastinal and hilar contours are within normal limits and stable. There is tortuosity and calcification of the thoracic aorta. The lungs are clear. No pleural effusion. The bony thorax is intact.  IMPRESSION: No acute cardiopulmonary findings.   Electronically Signed   By: Loralie Champagne M.D.   On: 06/25/2014 19:14   Ct Head Wo Contrast  06/25/2014   CLINICAL DATA:  Slurred speech and difficulty walking.  EXAM: CT HEAD WITHOUT CONTRAST  TECHNIQUE: Contiguous axial images were obtained from the base of the skull through the vertex without intravenous contrast.  COMPARISON:  06/24/2006.  FINDINGS: Stable age related cerebral atrophy, ventriculomegaly and periventricular white matter disease. 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 No extra-axial fluid collections are identified. No CT findings for acute hemispheric infarction or intracranial hemorrhage. No mass lesions. The brainstem and cerebellum are normal.  The bony structures are intact. The paranasal sinuses and mastoid air cells are clear. The globes are intact.  IMPRESSION: Age related cerebral atrophy, ventriculomegaly and periventricular white matter disease.  No acute intracranial findings or mass lesions.   Electronically Signed   By: Loralie Champagne M.D.   On: 06/25/2014 18:28   Mr Brain Wo Contrast  06/25/2014   CLINICAL DATA:  Mild right hemi paresis, speech difficulty.  EXAM: MRI HEAD WITHOUT CONTRAST  MRA HEAD WITHOUT CONTRAST  TECHNIQUE: Multiplanar,  multiecho pulse sequences of the brain and surrounding structures were obtained without intravenous contrast. Angiographic images of the head were obtained using MRA technique without contrast.  COMPARISON:  None.  Prior CT from earlier the same day.  FINDINGS: MRI HEAD FINDINGS  Diffuse prominence of the CSF containing spaces is compatible with age-related generalized cerebral atrophy. Minimal T2/FLAIR hyperintensity seen within the periventricular and deep white matter, within normal limits for patient age.  No diffusion-weighted signal abnormality to suggest acute intracranial infarct. Gray-white matter differentiation well maintained. Normal flow voids seen within the intracranial vasculature. There is no intracranial hemorrhage. Few punctate chronic micro hemorrhages noted within the left frontal lobe, which may be related to underlying hypertension.  No mass lesion or midline shift. Mild ventricular prominence related at global atrophy present without hydrocephalus. No extra-axial fluid collection.  Craniocervical junction widely patent. Pituitary gland within normal limits.  No acute abnormality seen about either orbit. Lens replacement noted at the left orbit.  Calvarium and scalp soft tissues demonstrate a normal appearance. Mild multilevel degenerative spondylolysis noted within the upper cervical spine.  Paranasal sinuses and mastoid air cells are clear.  MRA HEAD FINDINGS  ANTERIOR CIRCULATION:  Study is degraded by motion artifact.  Visualized portions of the distal atherosclerotic irregularity present within the cavernous carotid arteries bilaterally without hemodynamically significant stenosis. Supra clinoid segments well opacified.  Multi focal atherosclerotic irregularity noted within the A1 segments without significant stenosis. Anterior communicating artery and anterior cerebral arteries are within normal limits.  Moderate multi focal atherosclerotic irregularity present within the M1 segments  bilaterally. There is a severe stenosis within the distal right M1 segment. Stenosis measures approximately 3 mm in length. The right MCA branches are well opacified distally. Left MCA branches are normal.  No aneurysm within the anterior circulation.  POSTERIOR CIRCULATION:  Vertebral arteries are codominant without significant stenosis or occlusion. Posterior inferior cerebral arteries not well visualized. Vertebrobasilar junction and basilar artery are widely patent with antegrade flow. Proximal superior cerebral arteries are widely patent. Fetal origin of the left PCA noted. Posterior lower cerebral arteries are opacified without hemodynamically significant stenosis.  IMPRESSION: MRI HEAD IMPRESSION:  1. No acute intracranial infarct or other abnormality identified. 2. Moderate generalized cerebral atrophy.  MRA HEAD IMPRESSION:  1. Severe short-segment stenosis within the mid -distal right M1 segment. No other hemodynamically significant stenosis or proximal branch occlusion identified within the intracranial circulation. 2. Moderate multi focal atherosclerotic irregularity within the cavernous carotid arteries and middle cerebral arteries bilaterally. 3. Fetal origin of the left PCA.   Electronically Signed   By: Rise Mu M.D.   On: 06/25/2014 23:43   Mr Maxine Glenn Head/brain Wo Cm  06/25/2014   CLINICAL DATA:  Mild right hemi paresis, speech difficulty.  EXAM: MRI HEAD WITHOUT CONTRAST  MRA HEAD WITHOUT CONTRAST  TECHNIQUE: Multiplanar, multiecho pulse sequences of the brain and surrounding structures were obtained without intravenous contrast. Angiographic images of the head were obtained using MRA technique without contrast.  COMPARISON:  None.  Prior CT from earlier the same day.  FINDINGS: MRI HEAD FINDINGS  Diffuse prominence of the CSF containing spaces is compatible with age-related generalized cerebral atrophy. Minimal T2/FLAIR hyperintensity seen within the periventricular and deep white  matter, within normal limits for patient age.  No diffusion-weighted signal abnormality to suggest acute intracranial infarct. Gray-white matter differentiation well maintained. Normal flow voids seen within the intracranial vasculature. There is no intracranial hemorrhage. Few punctate chronic micro hemorrhages noted within the left frontal lobe, which may be related to underlying hypertension.  No mass lesion or midline shift. Mild ventricular prominence related at global atrophy present without hydrocephalus. No extra-axial fluid collection.  Craniocervical junction widely patent. Pituitary gland within normal limits. No acute abnormality seen about either orbit. Lens replacement noted at the left orbit.  Calvarium and scalp soft tissues demonstrate a normal appearance. Mild multilevel degenerative spondylolysis noted within the upper cervical spine.  Paranasal sinuses and mastoid air cells are clear.  MRA HEAD FINDINGS  ANTERIOR CIRCULATION:  Study is degraded by motion artifact.  Visualized portions of the distal atherosclerotic irregularity present within the cavernous carotid arteries bilaterally without hemodynamically significant stenosis. Supra clinoid segments well opacified.  Multi focal atherosclerotic irregularity noted within the A1 segments without significant stenosis. Anterior communicating artery and anterior cerebral arteries are within normal limits.  Moderate multi focal atherosclerotic irregularity present within  the M1 segments bilaterally. There is a severe stenosis within the distal right M1 segment. Stenosis measures approximately 3 mm in length. The right MCA branches are well opacified distally. Left MCA branches are normal.  No aneurysm within the anterior circulation.  POSTERIOR CIRCULATION:  Vertebral arteries are codominant without significant stenosis or occlusion. Posterior inferior cerebral arteries not well visualized. Vertebrobasilar junction and basilar artery are widely patent  with antegrade flow. Proximal superior cerebral arteries are widely patent. Fetal origin of the left PCA noted. Posterior lower cerebral arteries are opacified without hemodynamically significant stenosis.  IMPRESSION: MRI HEAD IMPRESSION:  1. No acute intracranial infarct or other abnormality identified. 2. Moderate generalized cerebral atrophy.  MRA HEAD IMPRESSION:  1. Severe short-segment stenosis within the mid -distal right M1 segment. No other hemodynamically significant stenosis or proximal branch occlusion identified within the intracranial circulation. 2. Moderate multi focal atherosclerotic irregularity within the cavernous carotid arteries and middle cerebral arteries bilaterally. 3. Fetal origin of the left PCA.   Electronically Signed   By: Rise Mu M.D.   On: 06/25/2014 23:43    Microbiology: Recent Results (from the past 240 hour(s))  URINE CULTURE     Status: None   Collection Time    06/26/14 11:29 AM      Result Value Ref Range Status   Specimen Description URINE, CLEAN CATCH   Final   Special Requests ADD [id number]   Final   Culture  Setup Time     Final   Value: 06/27/2014 19:58     Performed at [facility]   Colony Count PENDING   Incomplete   Culture     Final   Value: Culture reincubated for better growth     Performed at [facility]   Report Status PENDING   Incomplete     Labs: Basic Metabolic Panel:  Recent Labs Lab 06/25/14 1901 06/25/14 2348 06/27/14 0850  NA 137  --  137  K 3.9  --  3.8  CL 103  --  102  CO2 21  --  24  GLUCOSE 96  --  134*  BUN 22  --  22  CREATININE 0.84 0.81 0.64  CALCIUM 8.6  --  8.7   Liver Function Tests:  Recent Labs Lab 06/25/14 1901  AST 23  ALT 17  ALKPHOS 61  BILITOT 0.3  PROT 6.0  ALBUMIN 2.8*   No results found for this basename: LIPASE, AMYLASE,  in the last 168 hours  Recent Labs Lab 06/25/14 2348  AMMONIA 28   CBC:  Recent Labs Lab 06/25/14 1901 06/25/14 2348  06/27/14 0850 06/29/14 0533  WBC 10.7* 9.7 7.3 7.9  NEUTROABS 7.6  --   --   --   HGB 13.1 13.0 13.1 13.7  HCT 39.1 37.5* 39.0 41.2  MCV 97.3 95.2 94.2 94.7  PLT 89* 76* 82* 118*   Cardiac Enzymes:  Recent Labs Lab 06/25/14 1902  TROPONINI <0.30   BNP: BNP (last 3 results)  Recent Labs  06/25/14 1902  PROBNP 953.0*   CBG: No results found for this basename: GLUCAP,  in the last 168 hours     Signed:  Edsel Petrin  [facility] 06/29/2014, 11:23 AM

## 2014-06-29 NOTE — Progress Notes (Signed)
Patient d/c to SNF. Assessments remained unchanged prior to d/c. Discharging with the foley to the facility.

## 2014-06-29 NOTE — Clinical Social Work Note (Signed)
Pt will transition to [facility] awaiting insurance authorization.     Lesleigh Hughson, MSW, LCSWA [phone]

## 2014-06-29 NOTE — Progress Notes (Signed)
Report given to [facility].

## 2014-06-30 LAB — URINE CULTURE

## 2014-07-01 ENCOUNTER — Other Ambulatory Visit: Payer: Self-pay | Admitting: *Deleted

## 2014-07-01 ENCOUNTER — Telehealth: Payer: Self-pay | Admitting: Cardiology

## 2014-07-01 ENCOUNTER — Encounter: Payer: Self-pay | Admitting: *Deleted

## 2014-07-01 DIAGNOSIS — I4891 Unspecified atrial fibrillation: Secondary | ICD-10-CM

## 2014-07-01 NOTE — Progress Notes (Signed)
Patient ID: Zachary Ali, male   DOB: August 13, 1929, 80 y.o.   MRN: [id number] Patient arrived for appointment to receive a 30 day cardiac event monitor which was arranged by Corine Shelter, PA-C for primary service.  After explaining the test to the patient, he declined the test, stating, he did not think it was necessary for him.  Patient understands this test was to see if he was in Atrial Fibrillation and its role in possibly causing a stroke.  Patient will discuss further, if necessary, with Dr. Donette Larry.

## 2014-07-02 NOTE — Telephone Encounter (Signed)
Closed encounter °

## 2014-07-28 ENCOUNTER — Ambulatory Visit (INDEPENDENT_AMBULATORY_CARE_PROVIDER_SITE_OTHER): Payer: Medicare Other | Admitting: Podiatry

## 2014-07-28 ENCOUNTER — Encounter: Payer: Self-pay | Admitting: Podiatry

## 2014-07-28 VITALS — BP 129/65 | HR 73 | Resp 14 | Ht 68.0 in | Wt 159.0 lb

## 2014-07-28 DIAGNOSIS — M79676 Pain in unspecified toe(s): Secondary | ICD-10-CM

## 2014-07-28 DIAGNOSIS — B351 Tinea unguium: Secondary | ICD-10-CM

## 2014-07-28 NOTE — Progress Notes (Signed)
   Subjective:    Patient ID: Zachary Ali, male    DOB: 03/19/1929, 80 y.o.   MRN: [id number]  HPI Comments: N debridement L  10 toenails D and O long-term C elongated, thickened and tender in shoes A difficult to trim, tender T none recently     Review of Systems  Constitutional: Positive for unexpected weight change.  Respiratory: Positive for cough.   Genitourinary: Positive for difficulty urinating.  Neurological: Positive for seizures.  All other systems reviewed and are negative.      Objective:   Physical Exam  This patient presents with wife and is able to answer questions and appears orientated x3  Vascular: PT pulses 1/4 bilaterally DP pulses 2/4 bilaterally Pitting edema dorsal ankle and feet bilaterally  Neurological: Sensation to 10 g monofilament wire intact 3/5 bilaterally Vibratory sedation nonreactive bilaterally Ankle reflex equal and reactive bilaterally  Dermatological: The toenails are elongated, incurvated, hypertrophic, discolored 6-10  Musculoskeletal: Pes planus bilaterally Patient has unstable gait pattern using walker      Assessment & Plan:   Assessment: Peripheral neuropathy Unstable gait pattern Symptomatic onychomycoses 6-10  Plan: Debrided toenails x10 without a bleeding  Reappoint when necessary for nail debridement

## 2014-08-27 ENCOUNTER — Encounter: Payer: Self-pay | Admitting: Neurology

## 2014-08-27 ENCOUNTER — Ambulatory Visit (INDEPENDENT_AMBULATORY_CARE_PROVIDER_SITE_OTHER): Payer: Medicare Other | Admitting: Neurology

## 2014-08-27 VITALS — BP 116/66 | HR 70 | Ht 68.0 in | Wt 176.0 lb

## 2014-08-27 DIAGNOSIS — G40309 Generalized idiopathic epilepsy and epileptic syndromes, not intractable, without status epilepticus: Secondary | ICD-10-CM

## 2014-08-27 MED ORDER — PHENYTOIN SODIUM EXTENDED 100 MG PO CAPS: 100.0000 mg | ORAL_CAPSULE | Freq: Three times a day (TID) | ORAL | Status: DC

## 2014-08-27 MED ORDER — DIVALPROEX SODIUM 500 MG PO DR TAB: 500.0000 mg | DELAYED_RELEASE_TABLET | Freq: Three times a day (TID) | ORAL | Status: DC

## 2014-08-27 NOTE — Progress Notes (Signed)
[facility]  PATIENT: Zachary SowJohn D Ali DOB: [birth year]   REASON FOR VISIT: Followup for seizure disorder   HISTORY OF PRESENT ILLNESS: Zachary Ali, 80 year old  right-handed retired Technical sales engineermusician, is accompanied by his wife at today''s clinical visit returns for followup.  He was last seen 08/23/2012. No seizures in several years. Denies any falls, no assistive device.Discussed again stopping Dilantin and adding Keppra but pt is reluctant. No new complaints. He has not had recent labs. No new neurologic complaints     HISTORY: initial evaluation by  Dr Terrace ArabiaYan 08/17/10. He is referred to this clinic, because there was no longer recurrent seizure, and with this worsening gait difficulty, wondering if we can make some adjustment of his epileptiic medications  PMHx.He has past medical history of tachycardia is taking beta blocker, also has a history of seizure, has been on long-term epileptic medications.  He began to experience seizures since age 80, initially it was frequent Petit mal seizure, up to 12-16 episodes in a day, he has been treated with combination of Dilantin, and phenobarbital since age 80, without effectively control his petit mal seizure. In addition he also has some infrequent generalized tonic-clonic seizure.  In 1976, he was started on Depakote, which has drastically change his seizure,  his  petit mal seizure was  under much better control, while he was  on Depakote and phenobarbital combination, he developed a generalized tonic-clonic seizure, Dilantin was reintroduced later, he has been on current dose of 3 medication for 35 years.   Dilantin100 mg t.i.d. phenobarbital 100 mg every morning. Depakote500 mg t.i.d.   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 Over the past couple years, he was noticed to have unsteady gait. he denied incontinence,  bilateral lower extremity paresthesia. He has successfully weaned off  phenobarbital now,  he feels better afterwards, there is no recurrent seizure.  He is continue taking depakote 500mg  tid, dilantin 100 tid.  MRI brain showed mild atrophy, no acute lesions.  EEG, had mild background slowing, no epilepsy discharges.  He was able to successfully tapered off phenobarbital, felt much better after worse,  he is still on Depakote 500 mg 3 times a day, Dilantin 100 mg 3 times a day, no recurrent seizure,  He was admitted to the hospital June 24 2014, for worsening confusion, dysarthria, was diagnosed with UTI, repeat MRI of the brain showed no acute lesions, MRA showed intracranial atherosclerotic disease, he was started on baby aspirin daily,  He was discharged home after rehabilitation,overall doing very well,  Depakote level in hospital September 2015, 54.8, Dilantin was 10  REVIEW OF SYSTEMS: Full 14 system review of systems performed and notable only for: cough, gait difficulty     ALLERGIES: No Known Allergies  HOME MEDICATIONS: Outpatient Prescriptions Prior to Visit  Medication Sig Dispense Refill  . aspirin EC 81 MG EC tablet Take 1 tablet (81 mg total) by mouth daily.    . ciprofloxacin (CIPRO) 250 MG tablet Take 1 tablet (250 mg total) by mouth 2 (two) times daily. 12 tablet 0  . divalproex (DEPAKOTE) 500 MG DR tablet Take 500 mg by mouth 3 (three) times daily.    . metoprolol succinate (TOPROL-XL) 25 MG 24 hr tablet Take 25 mg by mouth daily.    . phenytoin (DILANTIN) 100 MG ER capsule Take 1 capsule (100 mg total) by mouth 3 (three) times daily. 90 capsule 8  . tamsulosin (FLOMAX) 0.4 MG CAPS capsule Take  1 capsule (0.4 mg total) by mouth daily. 30 capsule 0  . FINASTERIDE PO Take by mouth.     No facility-administered medications prior to visit.    PAST MEDICAL HISTORY: Past Medical History  Diagnosis Date  . Seizure     since childhood, Dr. Debarah Crape; off phenoparb since 2011, after many yr, doing ok  . Small vessel disease   . Allergic  rhinitis   . BPH (benign prostatic hypertrophy)     seen urology, Dr. Earlene Plater  . SVT (supraventricular tachycardia)   . Elevated PSA     dr. Earlene Plater PSA 3-6; no PSA-urology f/u for exam yrly  . Mild depression   . Dyslipidemia     diet control  . Edema leg     hx of  . Insomnia   . OA (osteoarthritis) of knee     left   . Bronchitis     episode of 2/10  . SOB (shortness of breath)   . Palpitations     w/u with Card-Dr. Anne Fu  . Paroxysmal supraventricular tachycardia     PAST SURGICAL HISTORY: Past Surgical History  Procedure Laterality Date  . Vasectomy    . US echocardiography      ok; stress test ok-05-2013 nl LVF, tr AR  . Hemorrhoid surgery    . Cataract extraction      2011-12    FAMILY HISTORY: Family History  Problem Relation Age of Onset  . Heart failure    . Heart disease Mother   . Coronary artery disease Mother   . Coronary artery disease Father   . Heart disease Father     SOCIAL HISTORY: History   Social History  . Marital Status: Married    Spouse Name: Hope    Number of Children: 0  . Years of Education: College   Occupational History  . Not on file.   Social History Main Topics  . Smoking status: Former Games developer  . Smokeless tobacco: Never Used  . Alcohol Use: 3.0 oz/week    5 Glasses of wine per week     Comment: weekly  . Drug Use: No  . Sexual Activity: Not on file   Other Topics Concern  . Not on file   Social History Narrative   Patient lives at home with wife [place].    Patient has no children.    Patient is college graduated.    Patient is retired.    Patient is right handed.            PHYSICAL EXAM  Filed Vitals:   08/27/14 1127  BP: 116/66  Pulse: 70  Height: 5\' 8"  (1.727 m)  Weight: 176 lb (79.833 kg)   Body mass index is 26.77 kg/(m^2).  Generalized: Well developed, in no acute distress   Neurological examination   Mentation: Alert oriented to time, place, history taking. Follows all commands speech and  language fluent  Cranial nerve II-XII: Pupils were equal round reactive to light extraocular movements were full, visual field were full on confrontational test. Facial sensation and strength were normal. hearing was intact to finger rubbing bilaterally. Uvula tongue midline. head turning and shoulder shrug and were normal and symmetric.Tongue protrusion into cheek strength was normal. Motor: normal bulk and tone, full strength in the BUE, BLE, fine finger movements normal, no pronator drift. No focal weakness Coordination: finger-nose-finger, heel-to-shin bilaterally, no dysmetria Reflexes: Diminished and symmetric upper and lower Gait and Station: Rising up from seated position by pushing on  chair arm,, wide based cautious gait no difficulty with turning, no assistive device   DIAGNOSTIC DATA (LABS, IMAGING, TESTING) -None to review   ASSESSMENT AND PLAN  80 y.o. year old male  has a past medical history of Seizure, presenting with confusion, dysarthria, in the setting of urinary tract infection, Has not had recurrent seizure for many years, continue current dose of Depakote 500 3 times a day, Dilantin 100 mg 3 times a day Return to clinic in one year with nurse practitioner   Levert FeinsteinYijun Nailah Luepke  [facility] [address], [address] [place], [place] [address] [phone]

## 2014-10-04 ENCOUNTER — Other Ambulatory Visit: Payer: Self-pay | Admitting: Neurology

## 2014-12-08 ENCOUNTER — Emergency Department (HOSPITAL_COMMUNITY): Payer: Medicare Other

## 2014-12-08 ENCOUNTER — Inpatient Hospital Stay (HOSPITAL_COMMUNITY)
Admission: EM | Admit: 2014-12-08 | Discharge: 2014-12-11 | DRG: 689 | Disposition: A | Discharge status: Skilled Nursing Facility | Payer: Medicare Other | Attending: Internal Medicine | Admitting: Internal Medicine

## 2014-12-08 ENCOUNTER — Encounter (HOSPITAL_COMMUNITY): Payer: Self-pay | Admitting: Emergency Medicine

## 2014-12-08 ENCOUNTER — Inpatient Hospital Stay (HOSPITAL_COMMUNITY): Payer: Medicare Other

## 2014-12-08 DIAGNOSIS — I739 Peripheral vascular disease, unspecified: Secondary | ICD-10-CM | POA: Diagnosis present

## 2014-12-08 DIAGNOSIS — D696 Thrombocytopenia, unspecified: Secondary | ICD-10-CM | POA: Diagnosis present

## 2014-12-08 DIAGNOSIS — E785 Hyperlipidemia, unspecified: Secondary | ICD-10-CM | POA: Diagnosis present

## 2014-12-08 DIAGNOSIS — I471 Supraventricular tachycardia: Secondary | ICD-10-CM | POA: Diagnosis present

## 2014-12-08 DIAGNOSIS — Z87891 Personal history of nicotine dependence: Secondary | ICD-10-CM | POA: Diagnosis not present

## 2014-12-08 DIAGNOSIS — G9340 Encephalopathy, unspecified: Secondary | ICD-10-CM | POA: Diagnosis present

## 2014-12-08 DIAGNOSIS — R338 Other retention of urine: Secondary | ICD-10-CM | POA: Diagnosis present

## 2014-12-08 DIAGNOSIS — G4700 Insomnia, unspecified: Secondary | ICD-10-CM | POA: Diagnosis present

## 2014-12-08 DIAGNOSIS — R531 Weakness: Secondary | ICD-10-CM

## 2014-12-08 DIAGNOSIS — N390 Urinary tract infection, site not specified: Secondary | ICD-10-CM | POA: Diagnosis present

## 2014-12-08 DIAGNOSIS — M179 Osteoarthritis of knee, unspecified: Secondary | ICD-10-CM | POA: Diagnosis present

## 2014-12-08 DIAGNOSIS — R339 Retention of urine, unspecified: Secondary | ICD-10-CM | POA: Diagnosis present

## 2014-12-08 DIAGNOSIS — E876 Hypokalemia: Secondary | ICD-10-CM | POA: Diagnosis present

## 2014-12-08 DIAGNOSIS — D649 Anemia, unspecified: Secondary | ICD-10-CM | POA: Diagnosis present

## 2014-12-08 DIAGNOSIS — E860 Dehydration: Secondary | ICD-10-CM | POA: Diagnosis present

## 2014-12-08 DIAGNOSIS — B9562 Methicillin resistant Staphylococcus aureus infection as the cause of diseases classified elsewhere: Secondary | ICD-10-CM | POA: Diagnosis present

## 2014-12-08 DIAGNOSIS — F329 Major depressive disorder, single episode, unspecified: Secondary | ICD-10-CM | POA: Diagnosis present

## 2014-12-08 DIAGNOSIS — R911 Solitary pulmonary nodule: Secondary | ICD-10-CM | POA: Diagnosis present

## 2014-12-08 DIAGNOSIS — Z7982 Long term (current) use of aspirin: Secondary | ICD-10-CM

## 2014-12-08 DIAGNOSIS — G40909 Epilepsy, unspecified, not intractable, without status epilepticus: Secondary | ICD-10-CM | POA: Diagnosis present

## 2014-12-08 DIAGNOSIS — Z22322 Carrier or suspected carrier of Methicillin resistant Staphylococcus aureus: Secondary | ICD-10-CM

## 2014-12-08 DIAGNOSIS — Y92009 Unspecified place in unspecified non-institutional (private) residence as the place of occurrence of the external cause: Secondary | ICD-10-CM

## 2014-12-08 DIAGNOSIS — R197 Diarrhea, unspecified: Secondary | ICD-10-CM | POA: Diagnosis present

## 2014-12-08 DIAGNOSIS — N179 Acute kidney failure, unspecified: Secondary | ICD-10-CM | POA: Diagnosis present

## 2014-12-08 DIAGNOSIS — Z9849 Cataract extraction status, unspecified eye: Secondary | ICD-10-CM

## 2014-12-08 DIAGNOSIS — R4182 Altered mental status, unspecified: Secondary | ICD-10-CM | POA: Diagnosis present

## 2014-12-08 DIAGNOSIS — W19XXXA Unspecified fall, initial encounter: Secondary | ICD-10-CM

## 2014-12-08 DIAGNOSIS — Z789 Other specified health status: Secondary | ICD-10-CM

## 2014-12-08 DIAGNOSIS — W010XXA Fall on same level from slipping, tripping and stumbling without subsequent striking against object, initial encounter: Secondary | ICD-10-CM | POA: Diagnosis present

## 2014-12-08 LAB — I-STAT TROPONIN, ED: TROPONIN I, POC: 0.03 ng/mL (ref 0.00–0.08)

## 2014-12-08 LAB — BASIC METABOLIC PANEL
Anion gap: 13 (ref 5–15)
BUN: 94 mg/dL — ABNORMAL HIGH (ref 6–23)
CHLORIDE: 102 mmol/L (ref 96–112)
CO2: 21 mmol/L (ref 19–32)
CREATININE: 3.03 mg/dL — AB (ref 0.50–1.35)
Calcium: 8.7 mg/dL (ref 8.4–10.5)
GFR calc Af Amer: 20 mL/min — ABNORMAL LOW (ref >90)
GFR calc non Af Amer: 18 mL/min — ABNORMAL LOW (ref >90)
Glucose, Bld: 100 mg/dL — ABNORMAL HIGH (ref 70–99)
Potassium: 4.7 mmol/L (ref 3.5–5.1)
SODIUM: 136 mmol/L (ref 135–145)

## 2014-12-08 LAB — CBC
HEMATOCRIT: 33.7 % — AB (ref 39.0–52.0)
Hemoglobin: 10.9 g/dL — ABNORMAL LOW (ref 13.0–17.0)
MCH: 30.3 pg (ref 26.0–34.0)
MCHC: 32.3 g/dL (ref 30.0–36.0)
MCV: 93.6 fL (ref 78.0–100.0)
PLATELETS: 105 10*3/uL — AB (ref 150–400)
RBC: 3.6 MIL/uL — AB (ref 4.22–5.81)
RDW: 14.1 % (ref 11.5–15.5)
WBC: 16.9 10*3/uL — AB (ref 4.0–10.5)

## 2014-12-08 LAB — URINALYSIS, ROUTINE W REFLEX MICROSCOPIC
BILIRUBIN URINE: NEGATIVE
Glucose, UA: NEGATIVE mg/dL
Ketones, ur: NEGATIVE mg/dL
NITRITE: NEGATIVE
PH: 5 (ref 5.0–8.0)
Protein, ur: 30 mg/dL — AB
SPECIFIC GRAVITY, URINE: 1.017 (ref 1.005–1.030)
Urobilinogen, UA: 0.2 mg/dL (ref 0.0–1.0)

## 2014-12-08 LAB — PHENYTOIN LEVEL, TOTAL: Phenytoin Lvl: 5.8 ug/mL — ABNORMAL LOW (ref 10.0–20.0)

## 2014-12-08 LAB — I-STAT CG4 LACTIC ACID, ED: Lactic Acid, Venous: 1.06 mmol/L (ref 0.5–2.0)

## 2014-12-08 LAB — URINE MICROSCOPIC-ADD ON

## 2014-12-08 MED ORDER — CEFTRIAXONE SODIUM IN DEXTROSE 20 MG/ML IV SOLN
1.0000 g | INTRAVENOUS | Status: DC
  Administered 2014-12-09: 1 g via INTRAVENOUS
  Filled 2014-12-08 (×2): qty 50

## 2014-12-08 MED ORDER — DEXTROSE 5 % IV SOLN
1.0000 g | Freq: Once | INTRAVENOUS | Status: AC
  Administered 2014-12-08: 1 g via INTRAVENOUS
  Filled 2014-12-08: qty 10

## 2014-12-08 MED ORDER — ACETAMINOPHEN 325 MG PO TABS
650.0000 mg | ORAL_TABLET | Freq: Four times a day (QID) | ORAL | Status: DC | PRN
  Administered 2014-12-10: 650 mg via ORAL
  Filled 2014-12-08: qty 2

## 2014-12-08 MED ORDER — SODIUM CHLORIDE 0.9 % IV BOLUS (SEPSIS)
1000.0000 mL | Freq: Once | INTRAVENOUS | Status: AC
  Administered 2014-12-08: 1000 mL via INTRAVENOUS

## 2014-12-08 MED ORDER — ASPIRIN EC 81 MG PO TBEC
81.0000 mg | DELAYED_RELEASE_TABLET | Freq: Every day | ORAL | Status: DC
Start: 2014-12-09 — End: 2014-12-11
  Administered 2014-12-09 – 2014-12-11 (×3): 81 mg via ORAL
  Filled 2014-12-08 (×3): qty 1

## 2014-12-08 MED ORDER — ALBUTEROL SULFATE (2.5 MG/3ML) 0.083% IN NEBU: 2.5000 mg | INHALATION_SOLUTION | RESPIRATORY_TRACT | Status: DC | PRN

## 2014-12-08 MED ORDER — ACETAMINOPHEN 650 MG RE SUPP: 650.0000 mg | Freq: Four times a day (QID) | RECTAL | Status: DC | PRN

## 2014-12-08 MED ORDER — OXYCODONE HCL 5 MG PO TABS: 5.0000 mg | ORAL_TABLET | ORAL | Status: DC | PRN

## 2014-12-08 MED ORDER — ONDANSETRON HCL 4 MG PO TABS: 4.0000 mg | ORAL_TABLET | Freq: Four times a day (QID) | ORAL | Status: DC | PRN

## 2014-12-08 MED ORDER — ENOXAPARIN SODIUM 30 MG/0.3ML ~~LOC~~ SOLN
30.0000 mg | SUBCUTANEOUS | Status: DC
  Administered 2014-12-09: 30 mg via SUBCUTANEOUS
  Filled 2014-12-08: qty 0.3

## 2014-12-08 MED ORDER — PHENYTOIN SODIUM EXTENDED 100 MG PO CAPS
100.0000 mg | ORAL_CAPSULE | Freq: Three times a day (TID) | ORAL | Status: DC
  Administered 2014-12-08 – 2014-12-11 (×8): 100 mg via ORAL
  Filled 2014-12-08 (×10): qty 1

## 2014-12-08 MED ORDER — FINASTERIDE 5 MG PO TABS
5.0000 mg | ORAL_TABLET | Freq: Every day | ORAL | Status: DC
  Administered 2014-12-09 – 2014-12-11 (×3): 5 mg via ORAL
  Filled 2014-12-08 (×3): qty 1

## 2014-12-08 MED ORDER — METOPROLOL SUCCINATE ER 25 MG PO TB24
25.0000 mg | ORAL_TABLET | Freq: Every day | ORAL | Status: DC
  Administered 2014-12-09 – 2014-12-11 (×3): 25 mg via ORAL
  Filled 2014-12-08 (×3): qty 1

## 2014-12-08 MED ORDER — ONDANSETRON HCL 4 MG/2ML IJ SOLN: 4.0000 mg | Freq: Four times a day (QID) | INTRAMUSCULAR | Status: DC | PRN

## 2014-12-08 MED ORDER — TAMSULOSIN HCL 0.4 MG PO CAPS
0.4000 mg | ORAL_CAPSULE | Freq: Every day | ORAL | Status: DC
  Administered 2014-12-08 – 2014-12-11 (×4): 0.4 mg via ORAL
  Filled 2014-12-08 (×4): qty 1

## 2014-12-08 MED ORDER — MORPHINE SULFATE 2 MG/ML IJ SOLN: 1.0000 mg | INTRAMUSCULAR | Status: DC | PRN

## 2014-12-08 MED ORDER — DIVALPROEX SODIUM 500 MG PO DR TAB
500.0000 mg | DELAYED_RELEASE_TABLET | Freq: Three times a day (TID) | ORAL | Status: DC
  Administered 2014-12-08 – 2014-12-11 (×9): 500 mg via ORAL
  Filled 2014-12-08 (×12): qty 1

## 2014-12-08 MED ORDER — SODIUM CHLORIDE 0.9 % IV SOLN
INTRAVENOUS | Status: AC
  Administered 2014-12-08 – 2014-12-09 (×2): via INTRAVENOUS

## 2014-12-08 NOTE — Progress Notes (Signed)
Utilization Review completed.  Aza Dantes RN CM  

## 2014-12-08 NOTE — ED Provider Notes (Signed)
CSN: [id number]     Arrival date & time 12/08/14  1535 History   First MD Initiated Contact with Patient 12/08/14 1550     Chief Complaint  Patient presents with  . Weakness     (Consider location/radiation/quality/duration/timing/severity/associated sxs/prior Treatment) HPI Comments: Fatigue and temp of 100 degrees orally this morning. Recently had foley removed one week ago after it had been indwelling for a few months after a urologic procedure. Seen at [facility] today, had foley replaced with >2000 mL of urine return. Sent here from [facility] due to concerns of confusion and possible UTI.  Patient is a 81 y.o. male presenting with weakness. The history is provided by the patient.  Weakness This is a new problem. The current episode started 12 to 24 hours ago. The problem occurs constantly. The problem has not changed since onset.Pertinent negatives include no chest pain and no abdominal pain. Nothing aggravates the symptoms. Nothing relieves the symptoms. He has tried nothing for the symptoms. The treatment provided no relief.    Past Medical History  Diagnosis Date  . Seizure     since childhood, Dr. Debarah Crape; off phenoparb since 2011, after many yr, doing ok  . Small vessel disease   . Allergic rhinitis   . BPH (benign prostatic hypertrophy)     seen urology, Dr. Earlene Plater  . SVT (supraventricular tachycardia)   . Elevated PSA     dr. Earlene Plater PSA 3-6; no PSA-urology f/u for exam yrly  . Mild depression   . Dyslipidemia     diet control  . Edema leg     hx of  . Insomnia   . OA (osteoarthritis) of knee     left   . Bronchitis     episode of 2/10  . SOB (shortness of breath)   . Palpitations     w/u with Card-Dr. Anne Fu  . Paroxysmal supraventricular tachycardia    Past Surgical History  Procedure Laterality Date  . Vasectomy    . US echocardiography      ok; stress test ok-05-2013 nl LVF, tr AR  . Hemorrhoid surgery    . Cataract extraction      2011-12    Family History  Problem Relation Age of Onset  . Heart failure    . Heart disease Mother   . Coronary artery disease Mother   . Coronary artery disease Father   . Heart disease Father    History  Substance Use Topics  . Smoking status: Former Games developer  . Smokeless tobacco: Never Used  . Alcohol Use: 3.0 oz/week    5 Glasses of wine per week     Comment: weekly    Review of Systems  Constitutional: Negative for fever and chills.  Cardiovascular: Negative for chest pain.  Gastrointestinal: Negative for abdominal pain.  Neurological: Positive for weakness.  All other systems reviewed and are negative.     Allergies  Review of patient's allergies indicates no known allergies.  Home Medications   Prior to Admission medications   Medication Sig Start Date End Date Taking? Authorizing Provider  aspirin EC 81 MG EC tablet Take 1 tablet (81 mg total) by mouth daily. 06/27/14   Maryann Mikhail, DO  ciprofloxacin (CIPRO) 250 MG tablet Take 1 tablet (250 mg total) by mouth 2 (two) times daily. 06/27/14   Maryann Mikhail, DO  DILANTIN 100 MG ER capsule TAKE (1) CAPSULE THREE TIMES DAILY. 10/04/14   Levert Feinstein, MD  divalproex (DEPAKOTE) 500 MG DR  tablet TAKE 1 TABLET 3 TIMES A DAY. 10/04/14   Levert Feinstein, MD  finasteride (PROSCAR) 5 MG tablet Take 5 mg by mouth daily. 08/09/14   Historical Provider, MD  metoprolol succinate (TOPROL-XL) 25 MG 24 hr tablet Take 25 mg by mouth daily.    Historical Provider, MD  tamsulosin (FLOMAX) 0.4 MG CAPS capsule Take 1 capsule (0.4 mg total) by mouth daily. 06/29/14   Maryann Mikhail, DO   BP 125/95 mmHg  Pulse 88  Temp(Src) 97.5 F (36.4 C) (Oral)  Ht  (1.727 m)  Wt 176 lb (79.833 kg)  BMI 26.77 kg/m2  SpO2 95% Physical Exam  Constitutional: He is oriented to person, place, and time. He appears well-developed and well-nourished. No distress.  HENT:  Head: Normocephalic and atraumatic.  Mouth/Throat: No oropharyngeal exudate.  Eyes: EOM  are normal. Pupils are equal, round, and reactive to light.  Neck: Normal range of motion. Neck supple.  Cardiovascular: Normal rate and regular rhythm.  Exam reveals no friction rub.   No murmur heard. Pulmonary/Chest: Effort normal and breath sounds normal. No respiratory distress. He has no wheezes. He has no rales.  Abdominal: Soft. He exhibits no distension. There is tenderness (suprapubic). There is no rebound.  Musculoskeletal: Normal range of motion. He exhibits edema (2+ bilateral).  Neurological: He is alert and oriented to person, place, and time.  Skin: No rash noted. He is not diaphoretic.  Nursing note and vitals reviewed.   ED Course  Procedures (including critical care time) Labs Review Labs Reviewed  URINE CULTURE  CBC  BASIC METABOLIC PANEL  URINALYSIS, ROUTINE W REFLEX MICROSCOPIC  I-STAT CG4 LACTIC ACID, ED  Rosezena Sensor, ED    Imaging Review Dg Chest 2 View  12/08/2014   CLINICAL DATA:  81 year old male with a history of weakness. Questionable UTI.  EXAM: CHEST - 2 VIEW  COMPARISON:  06/25/2014  FINDINGS: Cardiomediastinal silhouette unchanged in size and contour. Atherosclerotic calcifications of the aortic arch again noted. Tortuosity descending thoracic aorta.  No evidence of pulmonary vascular congestion.  Lung volumes are low which accentuates the interstitium in the vasculature. No evidence of confluent airspace disease, pleural effusion, pneumothorax. Chronic coarsening of the interstitial markings, as seen on prior chest x-ray.  Osteopenia. No displaced fracture. Similar appearance of the thoracic spine to the comparison plain film. Degenerative changes.  Calcifications of the abdominal aorta noted.  Note that there is irregular 11 mm nodule of the right upper lobe, just inferior to the clavicle. This was not evident on the comparison.  IMPRESSION: No radiographic evidence of acute cardiopulmonary disease, with a background of chronic lung changes.   Irregular 11 mm nodule of the right upper lobe. A repeat anterior chest x-ray with apical lordotic position may be useful to differentiate from involvement of the chest wall, alternatively a chest CT would be recommended.  Atherosclerosis.  Signed,  Yvone Neu. Loreta Ave, DO  [facility]  [facility]   Electronically Signed   By: Gilmer Mor D.O.   On: 12/08/2014 17:21     EKG Interpretation   Date/Time:  Wednesday December 08 2014 16:38:27 EST Ventricular Rate:  85 PR Interval:    QRS Duration: 89 QT Interval:  386 QTC Calculation: 459 R Axis:   67 Text Interpretation:  Atrial fibrillation Baseline wander in lead(s) V2  Similar to prior No significant change since last tracing Confirmed by  Gwendolyn Grant  MD, Gagan Dillion ([id number]) on 12/08/2014 4:52:13 PM  MDM   Final diagnoses:  Weakness  UTI (lower urinary tract infection)  Acute renal failure, unspecified acute renal failure type    81-year-old male here with some confusion, fatigue. [place] by wife. The symptom [facility] office across the street for his confusion and fatigue. They're concerned he has a possible UTI. He had his Foley removed 1 week ago after it was there for several months following a urologic procedure. They replaced the Foley today and had greater than 2 L of urine returned. Here vitals are stable. He has mild suprapubic tenderness. Lungs are clear. He has some swelling in his bilateral lower legs which is chronic. We'll check urine studies, basic labs, chest x-ray. Urine shows UTI. BMP shows ARF, likely due to urinary distention and incomplete emptying.  Admitted.    Elwin MochaBlair Remon Quinto, MD 12/08/14 [phone]

## 2014-12-08 NOTE — ED Notes (Addendum)
Pt is alert and is oriented but with episodes of confusion. Pt had episode of incontinent bowl at the urologist office.

## 2014-12-08 NOTE — H&P (Signed)
History and Physical  EXANDER SHAUL ZOX:[id number] DOB: 1929-02-18 DOA: 12/08/2014  Referring physician: Dr. Elwin Mocha, EDP PCP: Georgann Housekeeper, MD  Outpatient Specialists:  1. Urology: Dr. Jerilee Field.  Chief Complaint: Weakness, fall, difficulty urinating and fevers  HPI: Zachary Ali is a 81 y.o. male with history of BPH, status post microdissection surgery of prostate for BPH in January, had Foley removed one week ago after it had been indwelling for a few months post urological procedure, on nitrofurantoin recently, seizure disorder, SVT, HLD, leg edema, sent directly to the ED from urologist office due to UTI and acute urinary retention. Patient and spouse provide history. They give 4-5 days history of progressive generalized weakness, unsteady gait and patient slipped and fell last night. His wife thinks that it was due to him wearing socks. He was unable to get up. She called 911. No significant injuries except couple of bruises on elbows. They were advised to take Tylenol alternating with ibuprofen for fever (100F) and call M.D. in a.m. Thereby, they went to see his urologist. Patient also had 4-5 episodes of watery diarrhea this morning without nausea, vomiting or abdominal pain. Appetite is good. Denied dysuria or urinary frequency or difficulty urinating-however patient is a poor historian and spouse indicates that he may be slightly confused. Patient noted to have urinary retention at urologist's office and Foley was replaced draining >2 L of urine. He was sent to the ED for evaluation and admission. In the ED, afebrile, stable vital signs, creatinine 3.03, hemoglobin 10.9, WBCs 16.9 and chest x-ray without acute findings. Hospitalist admission requested.  Review of Systems: All systems reviewed and apart from history of presenting illness, are negative.  Past Medical History  Diagnosis Date  . Seizure     since childhood, Dr. Debarah Crape; off phenoparb since 2011, after many yr,  doing ok  . Small vessel disease   . Allergic rhinitis   . BPH (benign prostatic hypertrophy)     seen urology, Dr. Earlene Plater  . SVT (supraventricular tachycardia)   . Elevated PSA     dr. Earlene Plater PSA 3-6; no PSA-urology f/u for exam yrly  . Mild depression   . Dyslipidemia     diet control  . Edema leg     hx of  . Insomnia   . OA (osteoarthritis) of knee     left   . Bronchitis     episode of 2/10  . SOB (shortness of breath)   . Palpitations     w/u with Card-Dr. Anne Fu  . Paroxysmal supraventricular tachycardia    Past Surgical History  Procedure Laterality Date  . Vasectomy    . US echocardiography      ok; stress test ok-05-2013 nl LVF, tr AR  . Hemorrhoid surgery    . Cataract extraction      2011-12   Social History:  reports that he has quit smoking. He has never used smokeless tobacco. He reports that he drinks about 3.0 oz of alcohol per week. He reports that he does not use illicit drugs. Married.  No Known Allergies  Family History  Problem Relation Age of Onset  . Heart failure    . Heart disease Mother   . Coronary artery disease Mother   . Coronary artery disease Father   . Heart disease Father     Prior to Admission medications   Medication Sig Start Date End Date Taking? Authorizing Provider  aspirin EC 81 MG EC tablet Take 1  tablet (81 mg total) by mouth daily. 06/27/14  Yes Maryann Mikhail, DO  DILANTIN 100 MG ER capsule TAKE (1) CAPSULE THREE TIMES DAILY. Patient taking differently: Take 1 capsule 3 times daily. 10/04/14  Yes Levert Feinstein, MD  divalproex (DEPAKOTE) 500 MG DR tablet TAKE 1 TABLET 3 TIMES A DAY. Patient taking differently: Take 1 tablet 3 times a day. 10/04/14  Yes Levert Feinstein, MD  finasteride (PROSCAR) 5 MG tablet Take 5 mg by mouth daily. 08/09/14  Yes Historical Provider, MD  furosemide (LASIX) 20 MG tablet 20 mg 3 (three) times a week. Monday, Wednesday, and Friday. 11/22/14  Yes Historical Provider, MD  metoprolol succinate (TOPROL-XL)  25 MG 24 hr tablet Take 25 mg by mouth daily.   Yes Historical Provider, MD  potassium chloride (KLOR-CON) 8 MEQ tablet Take 8 mEq by mouth 3 (three) times a week. Monday, Wednesday, and Friday. 11/22/14  Yes Historical Provider, MD  tamsulosin (FLOMAX) 0.4 MG CAPS capsule Take 1 capsule (0.4 mg total) by mouth daily. 06/29/14  Yes Maryann Mikhail, DO  nitrofurantoin, macrocrystal-monohydrate, (MACROBID) 100 MG capsule Take 100 mg by mouth 2 (two) times daily.  12/01/14   Historical Provider, MD   Physical Exam: Filed Vitals:   12/08/14 1555 12/08/14 1559 12/08/14 1810  BP: 125/95  142/41  Pulse: 88  91  Temp: 97.5 F (36.4 C)  97.7 F (36.5 C)  TempSrc: Oral  Oral  Resp:   20  Height:   (1.727 m)   Weight:  79.833 kg (176 lb)   SpO2: 95%  100%     General exam: Moderately built and nourished pleasant elderly male patient, lying comfortably supine on the gurney in no obvious distress.  Head, eyes and ENT: Nontraumatic and normocephalic. Pupils equally reacting to light and accommodation. Oral mucosa dry.  Neck: Supple. No JVD, carotid bruit or thyromegaly.  Lymphatics: No lymphadenopathy.  Respiratory system: Clear to auscultation. No increased work of breathing.  Cardiovascular system: S1 and S2 heard, RRR. No JVD, murmurs, gallops, clicks. 1+ pitting bilateral leg edema.  Gastrointestinal system: Abdomen is nondistended, soft and nontender. Normal bowel sounds heard. No organomegaly or masses appreciated. Foley catheter draining concentrated appearing urine  Central nervous system: Alert and oriented to self and partly to place. No focal neurological deficits.  Extremities: Symmetric 5 x 5 power. Peripheral pulses symmetrically felt.   Skin: No rashes or acute findings. Mild superficial bruising over both elbows right >left  Musculoskeletal system: Negative exam.  Psychiatry: Pleasant and cooperative.   Labs on Admission:  Basic Metabolic Panel:  Recent Labs Lab  12/08/14 1632  NA 136  K 4.7  CL 102  CO2 21  GLUCOSE 100*  BUN 94*  CREATININE 3.03*  CALCIUM 8.7   Liver Function Tests: No results for input(s): AST, ALT, ALKPHOS, BILITOT, PROT, ALBUMIN in the last 168 hours. No results for input(s): LIPASE, AMYLASE in the last 168 hours. No results for input(s): AMMONIA in the last 168 hours. CBC:  Recent Labs Lab 12/08/14 1632  WBC 16.9*  HGB 10.9*  HCT 33.7*  MCV 93.6  PLT 105*   Cardiac Enzymes: No results for input(s): CKTOTAL, CKMB, CKMBINDEX, TROPONINI in the last 168 hours.  BNP (last 3 results)  Recent Labs  06/25/14 1902  PROBNP 953.0*   CBG: No results for input(s): GLUCAP in the last 168 hours.  Radiological Exams on Admission: Dg Chest 2 View  12/08/2014   CLINICAL DATA:  81 year old male with a  history of weakness. Questionable UTI.  EXAM: CHEST - 2 VIEW  COMPARISON:  06/25/2014  FINDINGS: Cardiomediastinal silhouette unchanged in size and contour. Atherosclerotic calcifications of the aortic arch again noted. Tortuosity descending thoracic aorta.  No evidence of pulmonary vascular congestion.  Lung volumes are low which accentuates the interstitium in the vasculature. No evidence of confluent airspace disease, pleural effusion, pneumothorax. Chronic coarsening of the interstitial markings, as seen on prior chest x-ray.  Osteopenia. No displaced fracture. Similar appearance of the thoracic spine to the comparison plain film. Degenerative changes.  Calcifications of the abdominal aorta noted.  Note that there is irregular 11 mm nodule of the right upper lobe, just inferior to the clavicle. This was not evident on the comparison.  IMPRESSION: No radiographic evidence of acute cardiopulmonary disease, with a background of chronic lung changes.  Irregular 11 mm nodule of the right upper lobe. A repeat anterior chest x-ray with apical lordotic position may be useful to differentiate from involvement of the chest wall,  alternatively a chest CT would be recommended.  Atherosclerosis.  Signed,  Yvone NeuJaime S. Loreta AveWagner, DO  [facility]  [facility]   Electronically Signed   By: Gilmer MorJaime  Wagner D.O.   On: 12/08/2014 17:21    EKG: Independently reviewed. Baseline tremor activity related to fevers and rigors. Otherwise appears to be in sinus rhythm without acute findings.  Assessment/Plan Active Problems:   Altered mental state   UTI (urinary tract infection)   Acute renal failure   Urinary retention   Thrombocytopenia   Anemia   Lung nodule   Fall at home   Diarrhea   UTI (lower urinary tract infection)   1. Complicated UTI: Continue IV Rocephin pending urine culture results. 2. Acute urinary retention: Status post removal of Foley catheter week ago. Foley reinserted and may have to remain at discharge until outpatient follow-up with urology. Status post recent BPH surgery. 3. Acute renal failure: Likely secondary to urinary retention. Management per problem #1. IV fluids. Check renal ultrasound. 4. Dehydration: Secondary to poor oral intake, diarrhea and UTI. IV fluids. 5. Diarrhea: Rule out C. difficile given recent exposure to antibiotics. Check C. difficile PCR. Contact isolation. 6. Acute encephalopathy: Mild. Possibly related to acute medical illness. No focal deficits. Monitor closely. 7. Anemia: Unclear etiology. Follow CBC post hydration. No reported bleeding. 8. Thrombocytopenia: Seems chronic. Follow CBC in a.m. 9. Fall at home: PT and OT evaluation. Likely secondary to acute medical illness leading to unsteady gait. 10. History of SVT: Continue Toprol-XL. 11. History of seizures: No reported recent seizures. Continue Dilantin and Depakote. Given history of unsteadiness, will check Dilantin level. 12. History of alcohol use: check CIWA and place on protocol as needed.  13. Right upper lobe pulmonary nodule: Seen on chest x-ray. Consider repeating anterior  chest x-ray with apical lordotic position versus CT prior to discharge.     Code Status: Full  Family Communication: Discussed with spouse at bedside  Disposition Plan: Home when medically stable   Time spent: 70 minutes  Kasen Adduci, MD, FACP, FHM. [facility] Pager [phone]  If 7PM-7AM, please contact night-coverage [email or web address] Password [email or web address] 12/08/2014, 7:30 PM

## 2014-12-08 NOTE — ED Notes (Signed)
Floor nurse unable to take report at this time, per [facility]E secretary

## 2014-12-08 NOTE — ED Notes (Signed)
Bed: WA07 Expected date:  Expected time:  Means of arrival:  Comments: EMS- weakness, recent foley placement

## 2014-12-08 NOTE — ED Notes (Signed)
Per Urologist, pt is weak. Pt at Urologist office for ? UTI. Pt had In and Out cath where they got <2000 ml of urine, dark in color. Pt denies any weakness nor pain. Pt a/o x3.

## 2014-12-09 LAB — BASIC METABOLIC PANEL
ANION GAP: 11 (ref 5–15)
BUN: 59 mg/dL — ABNORMAL HIGH (ref 6–23)
CO2: 19 mmol/L (ref 19–32)
Calcium: 8 mg/dL — ABNORMAL LOW (ref 8.4–10.5)
Chloride: 105 mmol/L (ref 96–112)
Creatinine, Ser: 1.39 mg/dL — ABNORMAL HIGH (ref 0.50–1.35)
GFR calc Af Amer: 52 mL/min — ABNORMAL LOW (ref >90)
GFR calc non Af Amer: 45 mL/min — ABNORMAL LOW (ref >90)
GLUCOSE: 87 mg/dL (ref 70–99)
Potassium: 3.5 mmol/L (ref 3.5–5.1)
Sodium: 135 mmol/L (ref 135–145)

## 2014-12-09 LAB — CBC
HCT: 31.3 % — ABNORMAL LOW (ref 39.0–52.0)
Hemoglobin: 10.3 g/dL — ABNORMAL LOW (ref 13.0–17.0)
MCH: 30.7 pg (ref 26.0–34.0)
MCHC: 32.9 g/dL (ref 30.0–36.0)
MCV: 93.2 fL (ref 78.0–100.0)
PLATELETS: 94 10*3/uL — AB (ref 150–400)
RBC: 3.36 MIL/uL — ABNORMAL LOW (ref 4.22–5.81)
RDW: 14.3 % (ref 11.5–15.5)
WBC: 21.9 10*3/uL — AB (ref 4.0–10.5)

## 2014-12-09 LAB — CLOSTRIDIUM DIFFICILE BY PCR: Toxigenic C. Difficile by PCR: NEGATIVE

## 2014-12-09 MED ORDER — ENSURE COMPLETE PO LIQD
237.0000 mL | Freq: Three times a day (TID) | ORAL | Status: DC
  Administered 2014-12-09 – 2014-12-11 (×4): 237 mL via ORAL

## 2014-12-09 MED ORDER — SODIUM CHLORIDE 0.9 % IV SOLN
INTRAVENOUS | Status: DC
  Administered 2014-12-09: 18:00:00 via INTRAVENOUS

## 2014-12-09 MED ORDER — ENOXAPARIN SODIUM 40 MG/0.4ML ~~LOC~~ SOLN
40.0000 mg | SUBCUTANEOUS | Status: DC
  Administered 2014-12-10: 40 mg via SUBCUTANEOUS
  Filled 2014-12-09: qty 0.4

## 2014-12-09 NOTE — Progress Notes (Signed)
PROGRESS NOTE    Zachary Ali WUJ:[id number] DOB: April 17, 1929 DOA: 12/08/2014 PCP: Georgann Housekeeper, MD  Outpatient Specialists:  Urology: Dr. Jerilee Field.  HPI/Brief narrative 81 year old male with history of surgery for BPH in January, had Foley removed one week PTA after it had been indwelling for a few months post urological procedure, on nitrofurantoin recently, seizure disorder, SVT, HLD, leg edema, sent from urologists office for acute urinary retention, UTI, unsteady gait and falls, diarrhea and confusion. In the ED, creatinine 3.03, hemoglobin 10.9, WBC 16.9, chest x-ray without acute findings.  Assessment/Plan:   1. Complicated UTI: Continue IV Rocephin pending urine culture results. 2. Acute urinary retention: Status post removal of Foley catheter week ago. Foley reinserted 2/24 and may have to remain at discharge until outpatient follow-up with urology. Status post recent BPH surgery. 3. Acute renal failure: Likely secondary to urinary retention. Management per problem #1. IV fluids. Check renal ultrasound-no hydronephrosis. Improving. 4. Dehydration: Secondary to poor oral intake, diarrhea and UTI. IV fluids for additional 24 hours. 5. Diarrhea: C. difficile PCR negative. Improved. 6. Acute encephalopathy: Possibly related to acute medical illness complicating possible underlying dementia. No focal deficits. Monitor closely. 7. Anemia: Unclear etiology. Stable. No reported bleeding. 8. Thrombocytopenia: Seems chronic. Follow CBC in a.m. 9. Leukocytosis: Related to problem #1 and stress response. Follow CBCs. 10. Fall at home: PT and OT evaluation. Likely secondary to acute medical illness leading to unsteady gait. Home with 24/7 supervision and assistance versus SNF depending on progress. 11. History of SVT: Continue Toprol-XL. 12. History of seizures: No reported recent seizures. Continue Dilantin and Depakote. Dilantin level: 5.8. 13. History of alcohol use: check CIWA  (low scores) and place on protocol as needed.  14. Right upper lobe pulmonary nodule: Seen on chest x-ray on admission. Consider repeating anterior chest x-ray with apical lordotic position versus CT prior to discharge-when patient able to cooperate.   Code Status: Full Family Communication: None at bedside Disposition Plan: Home Vs SNF when medically stable.   Consultants:  None  Procedures:  Foley catheter 2/24  Antibiotics:  IV Rocephin 2/24 >   Subjective: Patient pleasantly confused. As per nursing, no acute events.  Objective: Filed Vitals:   12/08/14 2007 12/08/14 2144 12/09/14 0621 12/09/14 1405  BP: 150/60 155/69 149/56 142/50  Pulse: 95 108 110 94  Temp: 98.1 F (36.7 C) 98.2 F (36.8 C) 98.2 F (36.8 C) 99.2 F (37.3 C)  TempSrc: Oral  Oral Oral  Resp: Height:      Weight: 79.8 kg (175 lb 14.8 oz)     SpO2: 100% 95% 98% 100%    Intake/Output Summary (Last 24 hours) at 12/09/14 1629 Last data filed at 12/09/14 1023  Gross per 24 hour  Intake    240 ml  Output   1900 ml  Net  -1660 ml   Filed Weights   12/08/14 1559 12/08/14 2007  Weight: 79.833 kg (176 lb) 79.8 kg (175 lb 14.8 oz)     Exam:  General exam: Pleasant elderly male lying comfortably supine in bed. Respiratory system: Clear. No increased work of breathing. Cardiovascular system: S1 & S2 heard, RRR. No JVD, murmurs, gallops, clicks. 1+ pitting bilateral pedal edema. Gastrointestinal system: Abdomen is nondistended, soft and nontender. Normal bowel sounds heard. Central nervous system: Alert and oriented only to self. Pleasantly confused. No focal neurological deficits. Extremities: Symmetric 5 x 5 power.   Data Reviewed: Basic Metabolic Panel:  Recent Labs Lab  12/08/14 1632 12/09/14 0545  NA 136 135  K 4.7 3.5  CL 102 105  CO2 21 19  GLUCOSE 100* 87  BUN 94* 59*  CREATININE 3.03* 1.39*  CALCIUM 8.7 8.0*   Liver Function Tests: No results for input(s):  AST, ALT, ALKPHOS, BILITOT, PROT, ALBUMIN in the last 168 hours. No results for input(s): LIPASE, AMYLASE in the last 168 hours. No results for input(s): AMMONIA in the last 168 hours. CBC:  Recent Labs Lab 12/08/14 1632 12/09/14 0545  WBC 16.9* 21.9*  HGB 10.9* 10.3*  HCT 33.7* 31.3*  MCV 93.6 93.2  PLT 105* 94*   Cardiac Enzymes: No results for input(s): CKTOTAL, CKMB, CKMBINDEX, TROPONINI in the last 168 hours. BNP (last 3 results)  Recent Labs  06/25/14 1902  PROBNP 953.0*   CBG: No results for input(s): GLUCAP in the last 168 hours.  Recent Results (from the past 240 hour(s))  Clostridium Difficile by PCR     Status: None   Collection Time: 12/09/14  2:18 AM  Result Value Ref Range Status   C difficile by pcr NEGATIVE NEGATIVE Final    Comment: Performed at [facility]         Studies: Dg Chest 2 View  12/08/2014   CLINICAL DATA:  81 year old male with a history of weakness. Questionable UTI.  EXAM: CHEST - 2 VIEW  COMPARISON:  06/25/2014  FINDINGS: Cardiomediastinal silhouette unchanged in size and contour. Atherosclerotic calcifications of the aortic arch again noted. Tortuosity descending thoracic aorta.  No evidence of pulmonary vascular congestion.  Lung volumes are low which accentuates the interstitium in the vasculature. No evidence of confluent airspace disease, pleural effusion, pneumothorax. Chronic coarsening of the interstitial markings, as seen on prior chest x-ray.  Osteopenia. No displaced fracture. Similar appearance of the thoracic spine to the comparison plain film. Degenerative changes.  Calcifications of the abdominal aorta noted.  Note that there is irregular 11 mm nodule of the right upper lobe, just inferior to the clavicle. This was not evident on the comparison.  IMPRESSION: No radiographic evidence of acute cardiopulmonary disease, with a background of chronic lung changes.  Irregular 11 mm nodule of the right upper lobe. A repeat  anterior chest x-ray with apical lordotic position may be useful to differentiate from involvement of the chest wall, alternatively a chest CT would be recommended.  Atherosclerosis.  Signed,  Yvone Neu. Loreta Ave, DO  [facility]  [facility]   Electronically Signed   By: Gilmer Mor D.O.   On: 12/08/2014 17:21   US Renal  12/08/2014   CLINICAL DATA:  Acute renal failure.  Urinary retention.  EXAM: RENAL/URINARY TRACT ULTRASOUND COMPLETE  COMPARISON:  06/29/2014  FINDINGS: Right Kidney:  Length: 12 cm. Echogenicity within normal limits. No mass or hydronephrosis visualized.  Left Kidney:  Length: 12 cm. Normal parenchymal echotexture and thickness. Hypoechoic structure in the upper pole renal sinus likely represents a parapelvic cyst. No hydronephrosis or solid mass.  Bladder:  Bladder is decompressed with a Foley catheter.  IMPRESSION: No evidence of hydronephrosis. Probable parapelvic cyst in the left kidney.   Electronically Signed   By: Burman Nieves M.D.   On: 12/08/2014 21:48        Scheduled Meds: . aspirin EC  81 mg Oral Daily  . cefTRIAXone (ROCEPHIN)  IV  1 g Intravenous Q24H  . divalproex  500 mg Oral 3 times per day  . [START ON 12/10/2014] enoxaparin (LOVENOX) injection  40 mg Subcutaneous Q24H  . feeding supplement (ENSURE COMPLETE)  237 mL Oral TID BM  . finasteride  5 mg Oral Daily  . metoprolol succinate  25 mg Oral Daily  . phenytoin  100 mg Oral TID  . tamsulosin  0.4 mg Oral Daily   Continuous Infusions:   Active Problems:   Altered mental state   UTI (urinary tract infection)   Acute renal failure   Urinary retention   Thrombocytopenia   Anemia   Lung nodule   Fall at home   Diarrhea   UTI (lower urinary tract infection)    Time spent: 40 minutes.    Marcellus ScottHONGALGI,Keera Altidor, MD, FACP, FHM. [facility] Pager [phone]  If 7PM-7AM, please contact night-coverage [email or web address] Password [email or web address] 12/09/2014, 4:29 PM     LOS: 1 day

## 2014-12-09 NOTE — Evaluation (Signed)
Occupational Therapy Evaluation Patient Details Name: Zachary Ali MRN: [id number] DOB: 20-Apr-1929 Today's Date: 12/09/2014    History of Present Illness Pt is a 81 y.o. male with history of BPH, status post microdissection surgery of prostate for BPH in January, had Foley removed one week ago after it had been indwelling for a few months post urological procedure, on nitrofurantoin recently, seizure disorder, SVT, HLD, leg edema, sent directly to the ED from urologist office due to UTI and acute urinary retention. Pt also admitted with weakness and falls.   Clinical Impression   Pt is not able to verbalize current location or even his birth date. He can follow some commands with cues and increased time. Attempted to transfer to EOB but unable with +1 assist. Pt indicating pain when therapist attempted to move LEs over to EOB. Attempted to sit up X 3. Assisted back to supine and set up try for lunch. Note pt to be trembly in UEs and dropping utensil at times. Will follow on acute to progress ADL independence.     Follow Up Recommendations  SNF;Supervision/Assistance - 24 hour    Equipment Recommendations  3 in 1 bedside comode    Recommendations for Other Services       Precautions / Restrictions Precautions Precautions: Fall      Mobility Bed Mobility Overal bed mobility: Needs Assistance Bed Mobility: Rolling Rolling: Mod assist         General bed mobility comments: unable with +1 assist. Rolled onto side with mod assist and rail. Pt unable to tolerate therapist moving LEs toward EOB.   Transfers                      Balance                                            ADL Overall ADL's : Needs assistance/impaired Eating/Feeding: Set up;Bed level   Grooming: Wash/dry face;Set up;Bed level   Upper Body Bathing: Moderate assistance;Bed level   Lower Body Bathing: Total assistance;Bed level   Upper Body Dressing : Moderate assistance;Bed  level   Lower Body Dressing: Total assistance;Bed level                 General ADL Comments: Attempted to have pt sit EOB X3 but pt not tolerating therapist touching LEs to help over to EOB. Pt stating it is uncomfortable for therapist to lift LEs. Pt only able to partially transfer to EOB and then requesting to stop. No family present to provide history. Pt not able to verbalize his birthdate or location. He did reach for rail with assist to try to sit up but couldnt tolerate LEs being moved.      Vision     Perception     Praxis      Pertinent Vitals/Pain Pain Assessment: Faces Faces Pain Scale: Hurts even more Pain Location: bilateral LE Pain Intervention(s): Repositioned;Monitored during session     Hand Dominance     Extremity/Trunk Assessment Upper Extremity Assessment Upper Extremity Assessment: Generalized weakness (note pt to be shaky with UEs during feeding)           Communication Communication Communication: Expressive difficulties   Cognition Arousal/Alertness: Awake/alert Behavior During Therapy: WFL for tasks assessed/performed Overall Cognitive Status: No family/caregiver present to determine baseline cognitive functioning  General Comments       Exercises       Shoulder Instructions      Home Living Family/patient expects to be discharged to:: Skilled nursing facility Living Arrangements: Spouse/significant other Available Help at Discharge: Family Type of Home: House                                  Prior Functioning/Environment          Comments: no family present to give PLOF. Pt not able to give history.     OT Diagnosis: Generalized weakness;Acute pain   OT Problem List: Decreased strength;Decreased knowledge of use of DME or AE;Pain   OT Treatment/Interventions: Self-care/ADL training;Patient/family education;Therapeutic activities;DME and/or AE instruction    OT Goals(Current  goals can be found in the care plan section) Acute Rehab OT Goals Patient Stated Goal: agreeable to try to sit up OT Goal Formulation: With patient Time For Goal Achievement: 12/23/14 Potential to Achieve Goals: Good  OT Frequency: Min 2X/week   Barriers to D/C:            Co-evaluation              End of Session    Activity Tolerance: Patient limited by pain Patient left: in bed;with call bell/phone within reach;with bed alarm set   Time: 1258-1315 OT Time Calculation (min): 17 min Charges:  OT General Charges $OT Visit: 1 Procedure OT Evaluation $Initial OT Evaluation Tier I: 1 Procedure G-Codes:    Lennox Laity  [phone] 12/09/2014, 1:44 PM

## 2014-12-09 NOTE — Progress Notes (Signed)
Clinical Social Work Department CLINICAL SOCIAL WORK PLACEMENT NOTE 12/09/2014  Patient:  Zachary Ali,Zachary Ali  Account Number:  [id number] Admit date:  12/08/2014  Clinical Social Worker:  Unk LightningHOLLY Yale Golla, LCSW  Date/time:  12/09/2014 03:30 PM  Clinical Social Work is seeking post-discharge placement for this patient at the following level of care:   SKILLED NURSING   (*CSW will update this form in Epic as items are completed)   12/09/2014  Patient/family provided with [facility] Department of Clinical Social Work's list of facilities offering this level of care within the geographic area requested by the patient (or if unable, by the patient's family).  12/09/2014  Patient/family informed of their freedom to choose among providers that offer the needed level of care, that participate in Medicare, Medicaid or managed care program needed by the patient, have an available bed and are willing to accept the patient.  12/09/2014  Patient/family informed of [facility]' ownership interest in [facility], as well as of the fact that they are under no obligation to receive care at this facility.  PASARR submitted to EDS on existing # PASARR number received on   FL2 transmitted to all facilities in geographic area requested by pt/family on  12/09/2014 FL2 transmitted to all facilities within larger geographic area on   Patient informed that his/her managed care company has contracts with or will negotiate with  certain facilities, including the following:     Patient/family informed of bed offers received:   Patient chooses bed at  Physician recommends and patient chooses bed at    Patient to be transferred to  on   Patient to be transferred to facility by  Patient and family notified of transfer on  Name of family member notified:    The following physician request were entered in Epic:   Additional Comments:

## 2014-12-09 NOTE — Progress Notes (Signed)
INITIAL NUTRITION ASSESSMENT  DOCUMENTATION CODES Per approved criteria  -Not Applicable   INTERVENTION: - Ensure Complete po TID, each supplement provides 350 kcal and 13 grams of protein - RD will continue to monitor for nutrition care plan  NUTRITION DIAGNOSIS: Inadequate oral intake related to weakness, fall, and fevers as evidenced by poor po intake.   Goal: Pt to meet >/= 90% of their estimated nutrition needs   Monitor:  Weight trend, po intake, acceptance of supplements, labs  Reason for Assessment: Malnutrition Screening Tool  81 y.o. male  Admitting Dx: <principal problem not specified>  ASSESSMENT: 81 y.o. male with history of BPH, status post microdissection surgery of prostate for BPH in January, had Foley removed one week ago after it had been indwelling for a few months post urological procedure, on nitrofurantoin recently, seizure disorder, SVT, HLD, leg edema, sent directly to the ED from urologist office due to UTI and acute urinary retention.  Pt asleep during RD visit. Spoke with RN and Psychologist, sport and exercisenurse tech. Pt has been eating ~25% of meals. No signs of fat or muscle wasting. Will order nutritional supplements to improve po intake. RD will continue to monitor.   Labs reviewed  Height: Ht Readings from Last 1 Encounters:  12/08/14 5\' 8"  (1.727 m)    Weight: Wt Readings from Last 1 Encounters:  12/08/14 175 lb 14.8 oz (79.8 kg)    Ideal Body Weight: 68.4 kg  % Ideal Body Weight: 117%  Wt Readings from Last 10 Encounters:  12/08/14 175 lb 14.8 oz (79.8 kg)  08/27/14 176 lb (79.833 kg)  07/28/14 159 lb (72.122 kg)  06/29/14 168 lb 14 oz (76.6 kg)  08/25/13 201 lb (91.173 kg)    BMI:  Body mass index is 26.76 kg/(m^2).  Estimated Nutritional Needs: Kcal: 2000-2200 Protein: 110-120 g Fluid: 2.1 L/day  Skin: intact  Diet Order: Diet regular  EDUCATION NEEDS: -Education needs addressed   Intake/Output Summary (Last 24 hours) at 12/09/14  1502 Last data filed at 12/09/14 1023  Gross per 24 hour  Intake    240 ml  Output   1900 ml  Net  -1660 ml    Last BM: prior to admission   Labs:   Recent Labs Lab 12/08/14 1632 12/09/14 0545  NA 136 135  K 4.7 3.5  CL 102 105  CO2 21 19  BUN 94* 59*  CREATININE 3.03* 1.39*  CALCIUM 8.7 8.0*  GLUCOSE 100* 87    CBG (last 3)  No results for input(s): GLUCAP in the last 72 hours.  Scheduled Meds: . aspirin EC  81 mg Oral Daily  . cefTRIAXone (ROCEPHIN)  IV  1 g Intravenous Q24H  . divalproex  500 mg Oral 3 times per day  . [START ON 12/10/2014] enoxaparin (LOVENOX) injection  40 mg Subcutaneous Q24H  . finasteride  5 mg Oral Daily  . metoprolol succinate  25 mg Oral Daily  . phenytoin  100 mg Oral TID  . tamsulosin  0.4 mg Oral Daily    Continuous Infusions:   Past Medical History  Diagnosis Date  . Seizure     since childhood, Dr. Debarah CrapeYen; off phenoparb since 2011, after many yr, doing ok  . Small vessel disease   . Allergic rhinitis   . BPH (benign prostatic hypertrophy)     seen urology, Dr. Earlene PlaterAVIS  . SVT (supraventricular tachycardia)   . Elevated PSA     dr. Earlene Platerdavis PSA 3-6; no PSA-urology f/u for exam yrly  .  Mild depression   . Dyslipidemia     diet control  . Edema leg     hx of  . Insomnia   . OA (osteoarthritis) of knee     left   . Bronchitis     episode of 2/10  . SOB (shortness of breath)   . Palpitations     w/u with Card-Dr. Anne Fu  . Paroxysmal supraventricular tachycardia     Past Surgical History  Procedure Laterality Date  . Vasectomy    . US echocardiography      ok; stress test ok-05-2013 nl LVF, tr AR  . Hemorrhoid surgery    . Cataract extraction      2011-12    Emmaline Kluver MS, RD, [place] [phone]

## 2014-12-09 NOTE — Evaluation (Signed)
Physical Therapy Evaluation Patient Details Name: Zachary Ali MRN: [id number] DOB: 25-Jan-1929 Today's Date: 12/09/2014   History of Present Illness  Pt is a 81 y.o. male with history of BPH, status post microdissection surgery of prostate for BPH in January, had Foley removed one week ago after it had been indwelling for a few months post urological procedure, on nitrofurantoin recently, seizure disorder, SVT, HLD, leg edema, sent directly to the ED from urologist office due to UTI and acute urinary retention. Pt also admitted with weakness and falls.  Clinical Impression  On eval, pt required Mod assist for mobility-able to ambulate ~135 feet with RW. No family present during session. Pt unable to provide PLOF/home environment info. Recommend ST rehab at SNF.    Follow Up Recommendations SNF;Supervision/Assistance - 24 hour (unless mobility improves and family feels they can manage safely with pt. No family present during session.)    Equipment Recommendations  Rolling walker with 5" wheels (if pt does not already have)    Recommendations for Other Services       Precautions / Restrictions Precautions Precautions: Fall Restrictions Weight Bearing Restrictions: No      Mobility  Bed Mobility Overal bed mobility: Needs Assistance Bed Mobility: Supine to Sit;Sit to Supine Rolling: Mod assist   Supine to sit: Max assist Sit to supine: Mod assist   General bed mobility comments: Assist for trunk and bil LEs. Increased time. Utilized bedpad for scooting, positioning.   Transfers Overall transfer level: Needs assistance Equipment used: Rolling walker (2 wheeled) Transfers: Sit to/from Stand Sit to Stand: Mod assist;From elevated surface         General transfer comment: Assist to rise, stabilize, control descent. Multimodal cues for safety, hand placement  Ambulation/Gait Ambulation/Gait assistance: Min assist Ambulation Distance (Feet): 135 Feet Assistive device: Rolling  walker (2 wheeled) Gait Pattern/deviations: Step-through pattern;Decreased stride length;Trunk flexed     General Gait Details: assist to stabilize pt and maneuver safely with walker. VCS safety, posture, distance from RW.   Stairs            Wheelchair Mobility    Modified Rankin (Stroke Patients Only)       Balance Overall balance assessment: Needs assistance         Standing balance support: Bilateral upper extremity supported;During functional activity Standing balance-Leahy Scale: Poor                               Pertinent Vitals/Pain Pain Assessment: No/denies pain Faces Pain Scale: Hurts even more Pain Location: bilateral LE Pain Intervention(s): Repositioned;Monitored during session    Home Living Family/patient expects to be discharged to:: Unsure Living Arrangements: Spouse/significant other Available Help at Discharge: Family Type of Home: House                Prior Function           Comments: Unsure of PLOF/history-no family present to give info.     Hand Dominance        Extremity/Trunk Assessment   Upper Extremity Assessment: Defer to OT evaluation           Lower Extremity Assessment: Generalized weakness      Cervical / Trunk Assessment: Kyphotic  Communication   Communication: Expressive difficulties  Cognition Arousal/Alertness: Awake/alert Behavior During Therapy: WFL for tasks assessed/performed Overall Cognitive Status: No family/caregiver present to determine baseline cognitive functioning Area of Impairment: Orientation;Following commands Orientation Level: Disoriented  to;Situation;Place     Following Commands: Follows one step commands with increased time            General Comments      Exercises        Assessment/Plan    PT Assessment Patient needs continued PT services  PT Diagnosis Difficulty walking;Generalized weakness   PT Problem List Decreased strength;Decreased  activity tolerance;Decreased balance;Decreased mobility;Decreased knowledge of use of DME;Decreased cognition  PT Treatment Interventions DME instruction;Gait training;Functional mobility training;Therapeutic activities;Therapeutic exercise;Patient/family education;Balance training   PT Goals (Current goals can be found in the Care Plan section) Acute Rehab PT Goals Patient Stated Goal: none stated PT Goal Formulation: Patient unable to participate in goal setting Time For Goal Achievement: 12/23/14 Potential to Achieve Goals: Good    Frequency Min 3X/week   Barriers to discharge        Co-evaluation               End of Session   Activity Tolerance: Patient tolerated treatment well Patient left: in bed;with call bell/phone within reach;with bed alarm set           Time: 1191-4782 PT Time Calculation (min) (ACUTE ONLY): 13 min   Charges:   PT Evaluation $Initial PT Evaluation Tier I: 1 Procedure     PT G Codes:        Rebeca Alert, MPT Pager: [phone]

## 2014-12-09 NOTE — Progress Notes (Signed)
Clinical Social Work Department BRIEF PSYCHOSOCIAL ASSESSMENT 12/09/2014  Patient:  Zachary Ali,Zachary Ali     Account Number:  [id number]     Admit date:  12/08/2014  Clinical Social Worker:  Dennison BullaGERBER,Tj Kitchings, LCSW  Date/Time:  12/09/2014 03:20 PM  Referred by:  Physician  Date Referred:  12/09/2014 Referred for  SNF Placement   Other Referral:   Interview type:  Family Other interview type:   Patient confused and unable to participate in assessment.    PSYCHOSOCIAL DATA Living Status:  FAMILY Admitted from facility:   Level of care:   Primary support name:  Hope Primary support relationship to patient:  SPOUSE Degree of support available:   Strong    CURRENT CONCERNS Current Concerns  Post-Acute Placement   Other Concerns:    SOCIAL WORK ASSESSMENT / PLAN CSW received referral to assist with DC planning. CSW reviewed chart which stated PT that recommends SNF placement. CSW attempted to meet with patient at bedside but patient confused and unable to fully participate.    CSW called and spoke with wife via phone. Wife reports patient lives at home with her but has gotten weaker and confused over the past couple of days. Wife reports patient had a similar episode in September 2015 and was hospitalized. After DC, patient went to [facility] for ST SNF. Wife reports she was recently diagnosed with breast cancer and cannot provide total care for patient. Wife reports she would be interested in SNF [facility] CSW left SNF list in room with CSW contact information.    CSW completed FL2 and faxed out. CSW will follow up with bed offers. Clinical[facility].   Assessment/plan status:  Psychosocial Support/Ongoing Assessment of Needs Other assessment/ plan:   Information/referral to community resources:   SNF information    PATIENT'S/FAMILY'S RESPONSE TO PLAN OF CARE: Patient unable to participate in assessment. Wife reports  they have been married several years and wants to provide the best care for patient. Wife became tearful and reports it has been difficult to care for herself and patient. CSW validated wife's feelings and encouraged her to make the best decision for what she felt would be best for patient and herself. Wife thanked CSW for support and reports she is agreeable to search and will make final decision based on how much patient progresses during hospital stay.       [place]cord[place]olly Ashe [place], [place] [phone]

## 2014-12-10 ENCOUNTER — Inpatient Hospital Stay (HOSPITAL_COMMUNITY): Payer: Medicare Other

## 2014-12-10 DIAGNOSIS — G9340 Encephalopathy, unspecified: Secondary | ICD-10-CM

## 2014-12-10 DIAGNOSIS — D696 Thrombocytopenia, unspecified: Secondary | ICD-10-CM

## 2014-12-10 LAB — CBC
HCT: 28.4 % — ABNORMAL LOW (ref 39.0–52.0)
Hemoglobin: 9.4 g/dL — ABNORMAL LOW (ref 13.0–17.0)
MCH: 30.9 pg (ref 26.0–34.0)
MCHC: 33.1 g/dL (ref 30.0–36.0)
MCV: 93.4 fL (ref 78.0–100.0)
PLATELETS: 70 10*3/uL — AB (ref 150–400)
RBC: 3.04 MIL/uL — ABNORMAL LOW (ref 4.22–5.81)
RDW: 14.7 % (ref 11.5–15.5)
WBC: 12.9 10*3/uL — ABNORMAL HIGH (ref 4.0–10.5)

## 2014-12-10 LAB — BASIC METABOLIC PANEL
Anion gap: 10 (ref 5–15)
BUN: 27 mg/dL — AB (ref 6–23)
CHLORIDE: 113 mmol/L — AB (ref 96–112)
CO2: 20 mmol/L (ref 19–32)
Calcium: 7.8 mg/dL — ABNORMAL LOW (ref 8.4–10.5)
Creatinine, Ser: 0.86 mg/dL (ref 0.50–1.35)
GFR calc Af Amer: >90 mL/min (ref >90)
GFR calc non Af Amer: 78 mL/min — ABNORMAL LOW (ref >90)
Glucose, Bld: 105 mg/dL — ABNORMAL HIGH (ref 70–99)
Potassium: 3.5 mmol/L (ref 3.5–5.1)
Sodium: 143 mmol/L (ref 135–145)

## 2014-12-10 NOTE — Progress Notes (Signed)
Clinical Social Work  CSW left another message with wife to discuss bed offers. CSW will continue to follow.  [place] Grande[place]olly Payton [place], [place] [phone]

## 2014-12-10 NOTE — Progress Notes (Signed)
PROGRESS NOTE    Zachary Ali ZOX:[id number] DOB: [birth year] DOA: 12/08/2014 PCP: Georgann Housekeeper, MD  Outpatient Specialists:  Urology: Dr. Jerilee Field.  HPI/Brief narrative 81 year old male with history of surgery for BPH in January, had Foley removed one week PTA after it had been indwelling for a few months post urological procedure, on nitrofurantoin recently, seizure disorder, SVT, HLD, leg edema, sent from urologists office for acute urinary retention, UTI, unsteady gait and falls, diarrhea and confusion. In the ED, creatinine 3.03, hemoglobin 10.9, WBC 16.9, chest x-ray without acute findings.  Assessment/Plan:   1. Colonization versus less likely Complicated UTI: Empirically treated with IV Rocephin. Preliminary urine culture results show staph aureus. Discussed with primary urologist on 2/26 who indicated that patient had urine culture in early February which had shown MRSA sensitive to gentamicin, Bactrim, nitrofurantoin, vancomycin. Patient recently was on nitrofurantoin. 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 Leukocytosis may have been due to stress response from urinary retention. Plan is to DC all antibiotics and monitor clinically. May consider antibiotics if he spikes temperatures or declines in any way. 2. Acute urinary retention: Status post removal of Foley catheter week ago. Foley reinserted 2/24 and may have to remain at discharge until outpatient follow-up with urology. Status post recent BPH surgery. As per discussion with primary urologist on 2/26, patient to be discharged on Foley catheter and outpatient follow-up at urology clinic. 3. Acute renal failure: Likely secondary to urinary retention. Management per problem #1. IV fluids. Check renal ultrasound-no hydronephrosis. Resolved. 4. Dehydration: Secondary to poor oral intake, diarrhea and UTI. Resolved after IV fluids. 5. Diarrhea: C. difficile PCR negative.  Improved. 6. Acute encephalopathy: Possibly related to acute medical illness complicating possible underlying dementia. No focal deficits. Monitor closely. Seemed coherent this morning. Possibly resolved. 7. Anemia: Unclear etiology. May be dilutional. No reported bleeding. Follow CBCs 8. Thrombocytopenia: Seems chronic. Platelets down to 70. No bleeding reported.? Related to antibiotics. Follow CBC in a.m. 9. Leukocytosis: Related to problem #1 and stress response. Improving. 10. Fall at home: PT and OT evaluation. Likely secondary to acute medical illness leading to unsteady gait. SNF at discharge. 11. History of SVT: Continue Toprol-XL. 12. History of seizures: No reported recent seizures. Continue Dilantin and Depakote. Dilantin level: 5.8. 13. History of alcohol use: check CIWA (low scores) and place on protocol as needed.  14. Right upper lobe pulmonary nodule: Seen on chest x-ray on admission. Consider repeating anterior chest x-ray with apical lordotic position versus CT prior to discharge-when patient able to cooperate.   Code Status: Full Family Communication: None at bedside Disposition Plan: SNF in the next 1-2 days.   Consultants:  None  Procedures:  Foley catheter 2/24  Antibiotics:  IV Rocephin 2/24 > 2/26  Subjective: Patient coherent and denies complaints. Cannot recollect why he came to the hospital though.  Objective: Filed Vitals:   12/09/14 2100 12/10/14 0518 12/10/14 0637 12/10/14 1429  BP: 152/74 145/69  148/77  Pulse: 92 98  99  Temp: 97.4 F (36.3 C) 100.3 F (37.9 C) 99 F (37.2 C) 98.2 F (36.8 C)  TempSrc: Oral Oral Oral Oral  Resp: Height:      Weight:      SpO2: 100% 100%  100%    Intake/Output Summary (Last 24 hours) at 12/10/14 1737 Last data filed at 12/10/14 0951  Gross per 24 hour  Intake    240 ml  Output   1250 ml  Net  -1010 ml   Filed Weights   12/08/14 1559 12/08/14 2007  Weight: 79.833 kg (176 lb) 79.8 kg  (175 lb 14.8 oz)     Exam:  General exam: Pleasant elderly male lying comfortably supine in bed. Respiratory system: Clear. No increased work of breathing. Cardiovascular system: S1 & S2 heard, RRR. No JVD, murmurs, gallops, clicks. 1+ pitting bilateral pedal edema. Gastrointestinal system: Abdomen is nondistended, soft and nontender. Normal bowel sounds heard. Central nervous system: Alert and oriented 3. No focal neurological deficits. Extremities: Symmetric 5 x 5 power.   Data Reviewed: Basic Metabolic Panel:  Recent Labs Lab 12/08/14 1632 12/09/14 0545 12/10/14 0622  NA 136 135 143  K 4.7 3.5 3.5  CL 102 105 113*  CO2 GLUCOSE 100* 87 105*  BUN 94* 59* 27*  CREATININE 3.03* 1.39* 0.86  CALCIUM 8.7 8.0* 7.8*   Liver Function Tests: No results for input(s): AST, ALT, ALKPHOS, BILITOT, PROT, ALBUMIN in the last 168 hours. No results for input(s): LIPASE, AMYLASE in the last 168 hours. No results for input(s): AMMONIA in the last 168 hours. CBC:  Recent Labs Lab 12/08/14 1632 12/09/14 0545 12/10/14 0622  WBC 16.9* 21.9* 12.9*  HGB 10.9* 10.3* 9.4*  HCT 33.7* 31.3* 28.4*  MCV 93.6 93.2 93.4  PLT 105* 94* 70*   Cardiac Enzymes: No results for input(s): CKTOTAL, CKMB, CKMBINDEX, TROPONINI in the last 168 hours. BNP (last 3 results)  Recent Labs  06/25/14 1902  PROBNP 953.0*   CBG: No results for input(s): GLUCAP in the last 168 hours.  Recent Results (from the past 240 hour(s))  Urine culture     Status: None (Preliminary result)   Collection Time: 12/08/14  4:29 PM  Result Value Ref Range Status   Specimen Description URINE, CATHETERIZED  Final   Special Requests Normal  Final   Colony Count   Final    >=100,000 COLONIES/ML Performed at [facility]    Culture   Final    STAPHYLOCOCCUS AUREUS Note: RIFAMPIN AND GENTAMICIN SHOULD NOT BE USED AS SINGLE DRUGS FOR TREATMENT OF STAPH INFECTIONS. Performed at [facility]     Report Status PENDING  Incomplete  Clostridium Difficile by PCR     Status: None   Collection Time: 12/09/14  2:18 AM  Result Value Ref Range Status   C difficile by pcr NEGATIVE NEGATIVE Final    Comment: Performed at [facility]         Studies: US Renal  12/08/2014   CLINICAL DATA:  Acute renal failure.  Urinary retention.  EXAM: RENAL/URINARY TRACT ULTRASOUND COMPLETE  COMPARISON:  06/29/2014  FINDINGS: Right Kidney:  Length: 12 cm. Echogenicity within normal limits. No mass or hydronephrosis visualized.  Left Kidney:  Length: 12 cm. Normal parenchymal echotexture and thickness. Hypoechoic structure in the upper pole renal sinus likely represents a parapelvic cyst. No hydronephrosis or solid mass.  Bladder:  Bladder is decompressed with a Foley catheter.  IMPRESSION: No evidence of hydronephrosis. Probable parapelvic cyst in the left kidney.   Electronically Signed   By: Burman Nieves M.D.   On: 12/08/2014 21:48        Scheduled Meds: . aspirin EC  81 mg Oral Daily  . cefTRIAXone (ROCEPHIN)  IV  1 g Intravenous Q24H  . divalproex  500 mg Oral 3 times per day  . enoxaparin (LOVENOX) injection  40 mg Subcutaneous Q24H  .  feeding supplement (ENSURE COMPLETE)  237 mL Oral TID BM  . finasteride  5 mg Oral Daily  . metoprolol succinate  25 mg Oral Daily  . phenytoin  100 mg Oral TID  . tamsulosin  0.4 mg Oral Daily   Continuous Infusions:   Active Problems:   Altered mental state   UTI (urinary tract infection)   Acute renal failure   Urinary retention   Thrombocytopenia   Anemia   Lung nodule   Fall at home   Diarrhea   UTI (lower urinary tract infection)    Time spent: 30 minutes.    Marcellus ScottHONGALGI,ANAND, MD, FACP, FHM. [facility] Pager [phone]  If 7PM-7AM, please contact night-coverage [email or web address] Password [email or web address] 12/10/2014, 5:37 PM    LOS: 2 days

## 2014-12-10 NOTE — Progress Notes (Signed)
Clinical Social Work  CSW spoke with Research scientist (physical sciences)Heather (Ext. [phone]) from [facility] in order to receive insurance approval.  [facility] needs patient's facility choice prior to completing auth. CSW left a message with wife to discuss bed offers due to patient's confusion. CSW will await a return call.  Cl[place]Holly Damen [place], [place] [phone]

## 2014-12-10 NOTE — Progress Notes (Addendum)
Clinical Social Work  CSW left several messages for wife but no return call. [facility] had [facility] and showed interest in [facility] during conversation on 2/25. CS[facility] with [facility] and got authorization # [id number] level [facility] and next review date is 12/13/14. [facility] to send information for ambulance authorization. CSW spoke with assistant director Wandra Mannan(Zack Brooks) who reports if wife has changed her mind and does not want [facility] then patient could go to another facility with letter of guarantee if needed. CSW left information for weekend CSW. Signed FL2 on chart.  Unk LightningHolly Cace [place], [place] [phone]  Addendum [phone] Wife arrived and wants [facility]. [facility] agreeable to accept. [facility] updated and auth still valid. MD aware and hand off left for weekend CSW.

## 2014-12-10 NOTE — Progress Notes (Signed)
Physical Therapy Treatment Patient Details Name: Zachary SowJohn D Ali MRN: [id number] DOB: 08/11/1929 Today's Date: 12/10/2014    History of Present Illness Pt is a 81 y.o. male with history of BPH, status post microdissection surgery of prostate for BPH in January, had Foley removed one week ago after it had been indwelling for a few months post urological procedure, on nitrofurantoin recently, seizure disorder, SVT, HLD, leg edema, sent directly to the ED from urologist office due to UTI and acute urinary retention. Pt also admitted with weakness and falls.    PT Comments    Continue to recommend SNF. Pt remains confused and requires multimodal cueing for mobility tasks.   Follow Up Recommendations  SNF;Supervision/Assistance - 24 hour     Equipment Recommendations  Rolling walker with 5" wheels    Recommendations for Other Services       Precautions / Restrictions Precautions Precautions: Fall Restrictions Weight Bearing Restrictions: No    Mobility  Bed Mobility Overal bed mobility: Needs Assistance Bed Mobility: Supine to Sit;Sit to Supine     Supine to sit: Mod assist Sit to supine: Mod assist   General bed mobility comments: Assist for trunk and bil LEs. Increased time. Utilized bedpad for scooting, positioning.   Transfers Overall transfer level: Needs assistance Equipment used: Rolling walker (2 wheeled) Transfers: Sit to/from [employer];Stand Pivot Transfers Sit to Stand: Mod assist Stand pivot transfers: Mod assist       General transfer comment: Assist to rise, stabilize, control descent. Multimodal cues for safety, hand placement. Pt incontinent of stool so [facility] to [facility] with [facility]. Total assist for hygiene.  Ambulation/Gait                 Stairs            Wheelchair Mobility    Modified Rankin (Stroke Patients Only)       Balance                                    Cognition Arousal/Alertness: Awake/alert Behavior During  Therapy: WFL for tasks assessed/performed Overall Cognitive Status: No family/caregiver present to determine baseline cognitive functioning Area of Impairment: Orientation;Following commands Orientation Level: Disoriented to;Situation;Place     Following Commands: Follows one step commands with increased time            Exercises      General Comments        Pertinent Vitals/Pain Pain Assessment: No/denies pain    Home Living                      Prior Function            PT Goals (current goals can now be found in the care plan section) Progress towards PT goals: Progressing toward goals    Frequency  Min 3X/week    PT Plan Current plan remains appropriate    Co-evaluation             End of Session Equipment Utilized During Treatment: Gait belt Activity Tolerance: Patient tolerated treatment well Patient left: in bed;with call bell/phone within reach;with bed alarm set     Time: 1346-1410 PT Time Calculation (min) (ACUTE ONLY): 24 min  Charges:  $Therapeutic Activity: 23-37 mins                    G Codes:      Aura CampsJannie  Starleen Blue, MPT Pager: [phone]

## 2014-12-11 LAB — CBC
HEMATOCRIT: 28.3 % — AB (ref 39.0–52.0)
HEMOGLOBIN: 9.4 g/dL — AB (ref 13.0–17.0)
MCH: 30.9 pg (ref 26.0–34.0)
MCHC: 33.2 g/dL (ref 30.0–36.0)
MCV: 93.1 fL (ref 78.0–100.0)
PLATELETS: 79 10*3/uL — AB (ref 150–400)
RBC: 3.04 MIL/uL — ABNORMAL LOW (ref 4.22–5.81)
RDW: 14.3 % (ref 11.5–15.5)
WBC: 10 10*3/uL (ref 4.0–10.5)

## 2014-12-11 LAB — BASIC METABOLIC PANEL
ANION GAP: 7 (ref 5–15)
BUN: 18 mg/dL (ref 6–23)
CALCIUM: 7.6 mg/dL — AB (ref 8.4–10.5)
CHLORIDE: 108 mmol/L (ref 96–112)
CO2: 22 mmol/L (ref 19–32)
Creatinine, Ser: 0.61 mg/dL (ref 0.50–1.35)
GFR, EST NON AFRICAN AMERICAN: 90 mL/min — AB (ref >90)
GLUCOSE: 84 mg/dL (ref 70–99)
Potassium: 3.3 mmol/L — ABNORMAL LOW (ref 3.5–5.1)
SODIUM: 137 mmol/L (ref 135–145)

## 2014-12-11 MED ORDER — POTASSIUM CHLORIDE CRYS ER 20 MEQ PO TBCR
40.0000 meq | EXTENDED_RELEASE_TABLET | Freq: Once | ORAL | Status: AC
  Administered 2014-12-11: 40 meq via ORAL
  Filled 2014-12-11: qty 2

## 2014-12-11 MED ORDER — ENSURE COMPLETE PO LIQD: 237.0000 mL | Freq: Three times a day (TID) | ORAL | Status: DC

## 2014-12-11 MED ORDER — ACETAMINOPHEN 325 MG PO TABS: 650.0000 mg | ORAL_TABLET | Freq: Four times a day (QID) | ORAL | Status: DC | PRN

## 2014-12-11 NOTE — Discharge Summary (Addendum)
Physician Discharge Summary  Jeani SowJohn D Halm MVH:[id number] DOB: [birth year] DOA: 12/08/2014  PCP: Georgann HousekeeperHUSAIN,KARRAR, MD  Admit date: 12/08/2014 Discharge date: 12/11/2014  Time spent: Greater than 30 minutes  Recommendations for Outpatient Follow-up:  1. M.D. at SNF in 3 days with repeat labs (CBC & BMP). Please follow final urine culture results that were sent from the hospital. 2. Continue Foley catheter until evaluation by patient's primary urologist. 3. Dr. Jerilee FieldMatthew Eskridge, Urology: SNF to arrange follow-up appointment in one to 2 weeks.  Discharge Diagnoses:  Active Problems:   Altered mental state   UTI (urinary tract infection)   Acute renal failure   Urinary retention   Thrombocytopenia   Anemia   Lung nodule   Fall at home   Diarrhea   UTI (lower urinary tract infection)   Discharge Condition: Improved & Stable  Diet recommendation: Heart healthy diet.  Filed Weights   12/08/14 1559 12/08/14 2007  Weight: 79.833 kg (176 lb) 79.8 kg (175 lb 14.8 oz)    History of present illness:  81 year old male with history of surgery for BPH in January, had Foley removed one week PTA after it had been indwelling for a few months post urological procedure, on nitrofurantoin recently, seizure disorder, SVT, HLD, leg edema, sent from urologists office for acute urinary retention, UTI, unsteady gait and falls, diarrhea and confusion. In the ED, creatinine 3.03, hemoglobin 10.9, WBC 16.9, chest x-ray without acute findings.  Hospital Course:    1. Colonization versus less likely Complicated UTI: Empirically treated with IV Rocephin. Preliminary urine culture results show staph aureus. Discussed with primary urologist on 2/26 who indicated that patient had urine culture in early February which had shown MRSA sensitive to gentamicin, Bactrim, nitrofurantoin, vancomycin & linezolid. Patient recently was on nitrofurantoin. 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 It is also reassuring that patient has not declined in the hospital i.e developed high fever or sepsis-like picture despite not being on treatment for MRSA - further corroborating our suspicion of colonization. Leukocytosis may have been due to stress response from urinary retention and has resolved. Plan is to DC without antibiotics and monitor clinically. May consider further workup & antibiotics if he spikes temperatures or declines in any way. 2. Acute urinary retention: Status post removal of Foley catheter week ago. Foley reinserted 2/24 and will have to remain at discharge until outpatient follow-up with urology. Status post recent BPH surgery. As per discussion with primary urologist on 2/26, patient to be discharged on Foley catheter and outpatient follow-up at urology clinic. 3. Acute renal failure: Likely secondary to urinary retention. Management per problem #1. IV fluids. Check renal ultrasound-no hydronephrosis. Resolved. 4. Dehydration: Secondary to poor oral intake, diarrhea and UTI. Resolved after IV fluids. 5. Diarrhea: C. difficile PCR negative. Improved. 6. Acute encephalopathy: Possibly related to acute medical illness complicating possible underlying dementia. No focal deficits. As per spouse, mental status is improved by >50%. Continue to monitor. 7. Anemia: Unclear etiology. May be dilutional. No reported bleeding. Stable 8. Thrombocytopenia: Seems chronic. Platelets down to 70. No bleeding reported.? Related to antibiotics. Stable 9. Leukocytosis: Related to problem #1 and stress response. Resolved. 10. Fall at home: PT and OT evaluation. Likely secondary to acute medical illness leading to unsteady gait. SNF at discharge. 11. History of SVT: Continue Toprol-XL. 12. History of seizures: No reported recent seizures. Continue Dilantin and Depakote. Dilantin level: 5.8. 13. History of alcohol use: No overt  withdrawal.  14. Right upper lobe pulmonary  nodule: Seen on chest x-ray on admission. As per radiology recommendation, repeated special view chest x-ray and previously questioned nodular density appears to represent spur off the anterior right first rib. 15. Hypokalemia: Replaced prior to discharge.   Consultations:  None  Procedures:  Foley catheter    Discharge Exam:  Complaints:  Patient denies complaints. As per spouse at bedside, patient has done significantly better and mental status is > 50% back to baseline.  Filed Vitals:   12/10/14 1429 12/10/14 2109 12/11/14 0541 12/11/14 0930  BP: 148/77 151/75 138/66 155/79  Pulse: 99 91 86 85  Temp: 98.2 F (36.8 C) 97.3 F (36.3 C) 97.3 F (36.3 C)   TempSrc: Oral Oral Oral   Resp: Height:      Weight:      SpO2: 100% 99% 99%     General exam: Pleasant elderly male lying comfortably supine in bed. Respiratory system: Clear. No increased work of breathing. Cardiovascular system: S1 & S2 heard, RRR. No JVD, murmurs, gallops, clicks. 1+ pitting bilateral pedal edema. Gastrointestinal system: Abdomen is nondistended, soft and nontender. Normal bowel sounds heard. Central nervous system: Alert and oriented 3. No focal neurological deficits. Extremities: Symmetric 5 x 5 power  Discharge Instructions      Discharge Instructions    Call MD for:  extreme fatigue    Complete by:  As directed      Call MD for:  persistant dizziness or light-headedness    Complete by:  As directed      Call MD for:  persistant nausea and vomiting    Complete by:  As directed      Call MD for:  redness, tenderness, or signs of infection (pain, swelling, redness, odor or green/yellow discharge around incision site)    Complete by:  As directed      Call MD for:  severe uncontrolled pain    Complete by:  As directed      Call MD for:  temperature >100.4    Complete by:  As directed      Call MD for:    Complete by:  As directed   Worsening confusion or mental status  changes.     Diet - low sodium heart healthy    Complete by:  As directed      Discharge instructions    Complete by:  As directed   Continue Foley catheter until outpatient follow-up with patient's primary urologist.     Increase activity slowly    Complete by:  As directed             Medication List    STOP taking these medications        nitrofurantoin (macrocrystal-monohydrate) 100 MG capsule  Commonly known as:  MACROBID      TAKE these medications        acetaminophen 325 MG tablet  Commonly known as:  TYLENOL  Take 2 tablets (650 mg total) by mouth every 6 (six) hours as needed for mild pain, moderate pain, fever or headache (or Fever >/= 101).     aspirin 81 MG EC tablet  Take 1 tablet (81 mg total) by mouth daily.     DILANTIN 100 MG ER capsule  Generic drug:  phenytoin  TAKE (1) CAPSULE THREE TIMES DAILY.     divalproex 500 MG DR tablet  Commonly known as:  DEPAKOTE  TAKE 1 TABLET 3 TIMES  A DAY.     feeding supplement (ENSURE COMPLETE) Liqd  Take 237 mLs by mouth 3 (three) times daily between meals.     finasteride 5 MG tablet  Commonly known as:  PROSCAR  Take 5 mg by mouth daily.     furosemide 20 MG tablet  Commonly known as:  LASIX  20 mg 3 (three) times a week. Monday, Wednesday, and Friday.     metoprolol succinate 25 MG 24 hr tablet  Commonly known as:  TOPROL-XL  Take 25 mg by mouth daily.     potassium chloride 8 MEQ tablet  Commonly known as:  KLOR-CON  Take 8 mEq by mouth 3 (three) times a week. Monday, Wednesday, and Friday.     tamsulosin 0.4 MG Caps capsule  Commonly known as:  FLOMAX  Take 1 capsule (0.4 mg total) by mouth daily.          The results of significant diagnostics from this hospitalization (including imaging, microbiology, ancillary and laboratory) are listed below for reference.    Significant Diagnostic Studies: Dg Chest 2 View  12/08/2014   CLINICAL DATA:  81 year old male with a history of weakness.  Questionable UTI.  EXAM: CHEST - 2 VIEW  COMPARISON:  06/25/2014  FINDINGS: Cardiomediastinal silhouette unchanged in size and contour. Atherosclerotic calcifications of the aortic arch again noted. Tortuosity descending thoracic aorta.  No evidence of pulmonary vascular congestion.  Lung volumes are low which accentuates the interstitium in the vasculature. No evidence of confluent airspace disease, pleural effusion, pneumothorax. Chronic coarsening of the interstitial markings, as seen on prior chest x-ray.  Osteopenia. No displaced fracture. Similar appearance of the thoracic spine to the comparison plain film. Degenerative changes.  Calcifications of the abdominal aorta noted.  Note that there is irregular 11 mm nodule of the right upper lobe, just inferior to the clavicle. This was not evident on the comparison.  IMPRESSION: No radiographic evidence of acute cardiopulmonary disease, with a background of chronic lung changes.  Irregular 11 mm nodule of the right upper lobe. A repeat anterior chest x-ray with apical lordotic position may be useful to differentiate from involvement of the chest wall, alternatively a chest CT would be recommended.  Atherosclerosis.  Signed,  Yvone Neu. Loreta Ave, DO  [facility]  [facility]   Electronically Signed   By: Gilmer Mor D.O.   On: 12/08/2014 17:21   US Renal  12/08/2014   CLINICAL DATA:  Acute renal failure.  Urinary retention.  EXAM: RENAL/URINARY TRACT ULTRASOUND COMPLETE  COMPARISON:  06/29/2014  FINDINGS: Right Kidney:  Length: 12 cm. Echogenicity within normal limits. No mass or hydronephrosis visualized.  Left Kidney:  Length: 12 cm. Normal parenchymal echotexture and thickness. Hypoechoic structure in the upper pole renal sinus likely represents a parapelvic cyst. No hydronephrosis or solid mass.  Bladder:  Bladder is decompressed with a Foley catheter.  IMPRESSION: No evidence of hydronephrosis. Probable parapelvic  cyst in the left kidney.   Electronically Signed   By: Burman Nieves M.D.   On: 12/08/2014 21:48   Dg Chest Lordotic Only  12/10/2014   CLINICAL DATA:  Possible lung nodule on prior chest x-ray.  EXAM: CHEST SPECIAL VIEW  COMPARISON:  None.  FINDINGS: AP lordotic view was performed. The previously question nodular density over the right upper lobe appears to represent a spur off the inferior aspect of the anterior right first rib. No visible pulmonary nodules.  IMPRESSION: Previously question nodular density appears to  represent spur off the anterior right first rib.   Electronically Signed   By: Charlett Nose M.D.   On: 12/10/2014 18:40    Microbiology: Recent Results (from the past 240 hour(s))  Urine culture     Status: None (Preliminary result)   Collection Time: 12/08/14  4:29 PM  Result Value Ref Range Status   Specimen Description URINE, CATHETERIZED  Final   Special Requests Normal  Final   Colony Count   Final    >=100,000 COLONIES/ML Performed at [facility]    Culture   Final    STAPHYLOCOCCUS AUREUS Note: RIFAMPIN AND GENTAMICIN SHOULD NOT BE USED AS SINGLE DRUGS FOR TREATMENT OF STAPH INFECTIONS. Performed at [facility]    Report Status PENDING  Incomplete  Clostridium Difficile by PCR     Status: None   Collection Time: 12/09/14  2:18 AM  Result Value Ref Range Status   C difficile by pcr NEGATIVE NEGATIVE Final    Comment: Performed at [facility]     Labs: Basic Metabolic Panel:  Recent Labs Lab 12/08/14 1632 12/09/14 0545 12/10/14 0622 12/11/14 0534  NA 136 135 143 137  K 4.7 3.5 3.5 3.3*  CL 102 105 113* 108  CO2 GLUCOSE 100* 87 105* 84  BUN 94* 59* 27* 18  CREATININE 3.03* 1.39* 0.86 0.61  CALCIUM 8.7 8.0* 7.8* 7.6*   Liver Function Tests: No results for input(s): AST, ALT, ALKPHOS, BILITOT, PROT, ALBUMIN in the last 168 hours. No results for input(s): LIPASE, AMYLASE in the last 168 hours. No results for  input(s): AMMONIA in the last 168 hours. CBC:  Recent Labs Lab 12/08/14 1632 12/09/14 0545 12/10/14 0622 12/11/14 0534  WBC 16.9* 21.9* 12.9* 10.0  HGB 10.9* 10.3* 9.4* 9.4*  HCT 33.7* 31.3* 28.4* 28.3*  MCV 93.6 93.2 93.4 93.1  PLT 105* 94* 70* 79*   Cardiac Enzymes: No results for input(s): CKTOTAL, CKMB, CKMBINDEX, TROPONINI in the last 168 hours. BNP: BNP (last 3 results) No results for input(s): BNP in the last 8760 hours.  ProBNP (last 3 results)  Recent Labs  06/25/14 1902  PROBNP 953.0*    CBG: No results for input(s): GLUCAP in the last 168 hours.      Signed:  Marcellus Scott, MD, FACP, FHM. [facility] Pager [phone]  If 7PM-7AM, please contact night-coverage [email or web address] Password [facility] 12/11/2014, 12:40 PM

## 2014-12-11 NOTE — Clinical Social Work Placement (Addendum)
Clinical Social Work Department CLINICAL SOCIAL WORK PLACEMENT NOTE 12/11/2014  Patient:  Zachary Ali,Zachary Ali  Account Number:  [id number] Admit date:  12/08/2014  Clinical Social Worker:  Unk LightningHOLLY Franz Svec, LCSW  Date/time:  12/09/2014 03:30 PM  Clinical Social Work is seeking post-discharge placement for this patient at the following level of care:   SKILLED NURSING   (*CSW will update this form in Epic as items are completed)   12/09/2014  Patient/family provided with [facility] Department of Clinical Social Work's list of facilities offering this level of care within the geographic area requested by the patient (or if unable, by the patient's family).  12/09/2014  Patient/family informed of their freedom to choose among providers that offer the needed level of care, that participate in Medicare, Medicaid or managed care program needed by the patient, have an available bed and are willing to accept the patient.  12/09/2014  Patient/family informed of [facility]' ownership interest in [facility], as well as of the fact that they are under no obligation to receive care at this facility.  PASARR submitted to EDS on existing # PASARR number received on   FL2 transmitted to all facilities in geographic area requested by pt/family on  12/09/2014 FL2 transmitted to all facilities within larger geographic area on   Patient informed that his/her managed care company has contracts with or will negotiate with  certain facilities, including the following:     Patient/family informed of bed offers received:  12/10/2014 [facility] Physician recommends and patient chooses bed at    [facility]  12/11/2014 Patient to be transferred to facility by ambulance Patient and family notified of transfer on 12/11/2014 [facility]  The following physician request were entered in  Epic:   Additional Comments:  .Elray Bubaegina Kujawa, LCSW [facility] Clinical Social Worker - Weekend Coverage cell #: [phone]

## 2014-12-11 NOTE — Progress Notes (Signed)
[facility]report was called,and given to Durward ParcelAnnie Miller RN. Vital signs stable. No c/o pain or discomfort noted. Patient being discharged with foley intact. Transported via EMS to awaiting facility.

## 2014-12-13 NOTE — Progress Notes (Signed)
Clinical Social Work  [employer] called with ambulance authorization. Auth # [id number]. [employer] called and provided information as well.  LangleyHolly Latondra Gebhart, [place] [phone]

## 2014-12-20 ENCOUNTER — Telehealth: Payer: Self-pay | Admitting: Neurology

## 2014-12-20 NOTE — Telephone Encounter (Signed)
[employer] with [employer] is calling with 1 year approval for  Rx Dilantin 100 mg.  Call if questions.

## 2015-01-17 LAB — URINE CULTURE
Colony Count: >=100000
Special Requests: NORMAL

## 2015-03-22 ENCOUNTER — Other Ambulatory Visit: Payer: Self-pay | Admitting: Urology

## 2015-03-22 ENCOUNTER — Encounter (HOSPITAL_COMMUNITY): Payer: Self-pay | Admitting: *Deleted

## 2015-03-30 ENCOUNTER — Encounter (HOSPITAL_COMMUNITY): Payer: Self-pay

## 2015-03-30 ENCOUNTER — Ambulatory Visit (HOSPITAL_COMMUNITY)
Admission: RE | Admit: 2015-03-30 | Discharge: 2015-03-30 | Disposition: A | Discharge status: Home / Self Care | Payer: Medicare Other | Source: Ambulatory Visit | Attending: Anesthesiology | Admitting: Anesthesiology

## 2015-03-30 ENCOUNTER — Encounter (HOSPITAL_COMMUNITY)
Admission: RE | Admit: 2015-03-30 | Discharge: 2015-03-30 | Disposition: A | Discharge status: Home / Self Care | Payer: Medicare Other | Source: Ambulatory Visit | Attending: Urology | Admitting: Urology

## 2015-03-30 DIAGNOSIS — N401 Enlarged prostate with lower urinary tract symptoms: Secondary | ICD-10-CM | POA: Insufficient documentation

## 2015-03-30 DIAGNOSIS — Z01818 Encounter for other preprocedural examination: Secondary | ICD-10-CM | POA: Insufficient documentation

## 2015-03-30 DIAGNOSIS — Z01812 Encounter for preprocedural laboratory examination: Secondary | ICD-10-CM | POA: Diagnosis not present

## 2015-03-30 DIAGNOSIS — R001 Bradycardia, unspecified: Secondary | ICD-10-CM | POA: Insufficient documentation

## 2015-03-30 DIAGNOSIS — R9389 Abnormal findings on diagnostic imaging of other specified body structures: Secondary | ICD-10-CM

## 2015-03-30 DIAGNOSIS — R338 Other retention of urine: Secondary | ICD-10-CM | POA: Diagnosis not present

## 2015-03-30 HISTORY — DX: Presence of other specified devices: Z97.8

## 2015-03-30 HISTORY — DX: Presence of urogenital implants: Z96.0

## 2015-03-30 HISTORY — DX: Pneumonia, unspecified organism: J18.9

## 2015-03-30 HISTORY — DX: Cerebral infarction, unspecified: I63.9

## 2015-03-30 HISTORY — DX: Nontraumatic intracerebral hemorrhage, unspecified: I61.9

## 2015-03-30 HISTORY — DX: Urinary tract infection, site not specified: N39.0

## 2015-03-30 LAB — CBC
HCT: 38.3 % — ABNORMAL LOW (ref 39.0–52.0)
Hemoglobin: 12.5 g/dL — ABNORMAL LOW (ref 13.0–17.0)
MCH: 31.3 pg (ref 26.0–34.0)
MCHC: 32.6 g/dL (ref 30.0–36.0)
MCV: 96 fL (ref 78.0–100.0)
PLATELETS: 139 10*3/uL — AB (ref 150–400)
RBC: 3.99 MIL/uL — AB (ref 4.22–5.81)
RDW: 13 % (ref 11.5–15.5)
WBC: 8.5 10*3/uL (ref 4.0–10.5)

## 2015-03-30 LAB — SURGICAL PCR SCREEN
MRSA, PCR: NEGATIVE
Staphylococcus aureus: NEGATIVE

## 2015-03-30 LAB — BASIC METABOLIC PANEL
Anion gap: 20 — ABNORMAL HIGH (ref 5–15)
BUN: 35 mg/dL — ABNORMAL HIGH (ref 6–20)
CALCIUM: 9.8 mg/dL (ref 8.9–10.3)
CO2: 25 mmol/L (ref 22–32)
Chloride: 98 mmol/L — ABNORMAL LOW (ref 101–111)
Creatinine, Ser: 0.93 mg/dL (ref 0.61–1.24)
GLUCOSE: 95 mg/dL (ref 65–99)
Potassium: 4.4 mmol/L (ref 3.5–5.1)
Sodium: 143 mmol/L (ref 135–145)

## 2015-03-30 NOTE — Progress Notes (Addendum)
Spoke with Dr Chaney Malling / Anesthesia in regards to pts H&P. Anesthesia has requested new EKG. This nurse requested last OV note per Dr Eula Listen. Anesthesia wanted to make sure Dr Eula Listen is aware that the EKG from 11/2014 showed A Fib(VM message left with MD office for return call).This nurse did show EKGs to anesthesia from 06/25/2014, 12/08/2014 and 03/30/2015 (all on chart) ECHO per epic 06/26/2014 Carotid Dopplers epic 06/2014  There is a clearance note per chart from Dr Eula Listen from 03/18/2015  OV note per Dr Eula Listen per chart 02/23/2015 Anesthesia will see pt day of surgery.

## 2015-03-30 NOTE — Progress Notes (Signed)
BMP results in epic per PAT visit 03/30/2015 sent to Dr Mena Goes

## 2015-03-30 NOTE — Patient Instructions (Signed)
ERSEL KLEEMAN  03/30/2015   Your procedure is scheduled on: Tuesday April 05, 2015   Report to [facility] Main  Entrance and follow signs to               Short Stay Center arrive at 7:00 AM.  Call this number if you have problems the morning of surgery [phone]   Remember: ONLY 1 PERSON MAY GO WITH YOU TO SHORT STAY TO GET  READY MORNING OF YOUR SURGERY.  Do not eat food or drink liquids :After Midnight.     Take these medicines the morning of surgery with A SIP OF WATER: Dilantin; Divalproex (Depakote); Metoprolol                               You may not have any metal on your body including hair pins and              piercings  Do not wear jewelry, lotions, powders or colognes, deodorant             Men may shave face and neck.   Do not bring valuables to the hospital. [place] IS NOT             RESPONSIBLE   FOR VALUABLES.  Contacts, dentures or bridgework may not be worn into surgery.      Patients discharged the day of surgery will not be allowed to drive home.  Name and phone number of your driver:Hope Prinkey (wife)                Please read over the following fact sheets you were given:MRSA Information Sheet  _____________________________________________________________________             [facility] - Preparing for Surgery Before surgery, you can play an important role.  Because skin is not sterile, your skin needs to be as free of germs as possible.  You can reduce the number of germs on your skin by washing with CHG (chlorahexidine gluconate) soap before surgery.  CHG is an antiseptic cleaner which kills germs and bonds with the skin to continue killing germs even after washing. Please DO NOT use if you have an allergy to CHG or antibacterial soaps.  If your skin becomes reddened/irritated stop using the CHG and inform your nurse when you arrive at Short Stay. Do not shave (including legs and underarms) for at least 48 hours prior to the  first CHG shower.  You may shave your face/neck. Please follow these instructions carefully:  1.  Shower with CHG Soap the night before surgery and the  morning of Surgery.  2.  If you choose to wash your hair, wash your hair first as usual with your  normal  shampoo.  3.  After you shampoo, rinse your hair and body thoroughly to remove the  shampoo.                           4.  Use CHG as you would any other liquid soap.  You can apply chg directly  to the skin and wash                       Gently with a scrungie or clean washcloth.  5.  Apply the CHG Soap to  your body ONLY FROM THE NECK DOWN.   Do not use on face/ open                           Wound or open sores. Avoid contact with eyes, ears mouth and genitals (private parts).                       Wash face,  Genitals (private parts) with your normal soap.             6.  Wash thoroughly, paying special attention to the area where your surgery  will be performed.  7.  Thoroughly rinse your body with warm water from the neck down.  8.  DO NOT shower/wash with your normal soap after using and rinsing off  the CHG Soap.                9.  Pat yourself dry with a clean towel.            10.  Wear clean pajamas.            11.  Place clean sheets on your bed the night of your first shower and do not  sleep with pets. Day of Surgery : Do not apply any lotions/deodorants the morning of surgery.  Please wear clean clothes to the hospital/surgery center.  FAILURE TO FOLLOW THESE INSTRUCTIONS MAY RESULT IN THE CANCELLATION OF YOUR SURGERY PATIENT SIGNATURE_________________________________  NURSE SIGNATURE__________________________________  ________________________________________________________________________

## 2015-04-01 NOTE — Progress Notes (Signed)
Faxed the above note to dr Donette Larry via epic

## 2015-04-04 MED ORDER — GENTAMICIN SULFATE 40 MG/ML IJ SOLN
5.0000 mg/kg | INTRAMUSCULAR | Status: AC
  Administered 2015-04-05: 390.4 mg via INTRAVENOUS
  Filled 2015-04-04: qty 9.75

## 2015-04-04 NOTE — H&P (Signed)
History of Present Illness F/u - PCP is Dr. Donette Larry.      1-urinary retention-September 2015-the patient was admitted to Hospital earlier this month with seizure disorder, encephalopathy, and CVA. He was noted to have decreased urine output. A Foley catheter was placed and drained over 900 cc. He has a history of BPH and elevated PSA. Patient complains of urinary hesitancy and weak stream prior to hospitalization. He is currently on tamsulosin.  -Oct 2015 failed voiding trial  -Jan 2016 - underwent CTT - failed multiple voiding trials.  -Feb 2016 - void trial - urinary retention with ARF and sepsis. Urine cx MRSA sensitive to Bactrim and nitrofurantoin. BUN 18, creatinine 0.61.    2-BPH-September 2015- nl DRE, 2+ prostate noted on exam April 2011. He continued surveillance. He is now tamsulosin. Added finasteride.   -Jan 2016 underwent CTT     3-elevated PSA-patient has a history of elevated PSA but I did not see history of prostate biopsy.  Previous PSA screening:  -March 2011 PSA 6.2, 19% free   -Sep 2015 nl DRE, BPH on exam, started finasteride          May 2016 interval hx  Patient returns in continued management of BPH and urinary retention. He continues combination therapy and has failed multiple voiding trials. He follows up today for cystoscopy and catheter change to consider outlet procedure and determine if he developed any stricture or prostate shrinkage after TUMT.    Past Medical History Problems  1. History of transient cerebral ischemia (Z86.73) 2. History of Seizure  Surgical History Problems  1. History of Cataract Surgery  Current Meds 1. Aspirin 81 MG TABS;  Therapy: (Recorded:17Sep2015) to Recorded 2. Depakote 500 MG Oral Tablet Delayed Release;  Therapy: (Recorded:17Sep2015) to Recorded 3. Dilantin 100 MG Oral Capsule;  Therapy: (Recorded:17Sep2015) to Recorded 4. Finasteride 5 MG Oral Tablet; Take 1 tablet daily;  Therapy: 17Sep2015 to  (Evaluate:11Sep2016)  Requested for: 17Sep2015; Last  Rx:17Sep2015 Ordered 5. Flomax 0.4 MG CPCR;  Therapy: (Recorded:17Sep2015) to Recorded 6. Furosemide 20 MG Oral Tablet;  Therapy: (Recorded:17Feb2016) to Recorded 7. Potassium Citrate GRAN;  Therapy: (Recorded:17Feb2016) to Recorded 8. Toprol XL TB24;  Therapy: (Recorded:21Feb2008) to Recorded  Allergies Medication  1. No Known Drug Allergies  Family History Problems  1. No pertinent family history : Mother  Social History Problems  1. Alcohol Use   1 beer/week 2. Caffeine Use 3. Former smoker [phone]) 4. Marital History - Currently Married 5. Number of children   No children 6. Retired  Research scientist (life sciences) Vital Signs [Data Includes: Last 1 Day]  Recorded: 31May2016 02:11PM  Blood Pressure: 144 / 74 Temperature: 97.1 F Heart Rate: 68  Physical Exam Constitutional: Well nourished and well developed . No acute distress.  Pulmonary: No respiratory distress and normal respiratory rhythm and effort.  Cardiovascular: Heart rate and rhythm are normal . No peripheral edema.  Neuro/Psych:. Mood and affect are appropriate.    Procedure  Procedure: Cystoscopy  Chaperone Present: lisa.  Indication: Lower Urinary Tract Symptoms.  Informed Consent: Risks, benefits, and potential adverse events were discussed and informed consent was obtained from the patient.  Prep: The patient was prepped with betadine.  Antibiotic prophylaxis: Trimethoprim/Sulfamethoxazole.  Procedure Note:  Urethral meatus:. No abnormalities.  Anterior urethra: No abnormalities.  Prostatic urethra: No abnormalities . There was visual obstruction of the prostatic urethra. The lateral prostatic lobes were enlarged.  Bladder: Visulization was clear. The ureteral orifices were in the normal anatomic position bilaterally and had clear  efflux of urine. A systematic survey of the bladder demonstrated no bladder tumors or stones. The mucosa was smooth without  abnormalities. Examination of the bladder demonstrated trabeculation. The patient tolerated the procedure well.  Complications: None. He was filled to 300 cc and had the urge to void. He could not void. He was prepped and a new 16 French Foley catheter placed without difficulty. This was left to gravity drainage.    Assessment Assessed  1. Benign prostatic hyperplasia with urinary obstruction (N40.1,N13.8) 2. Urinary retention (R33.9)  Plan Urinary retention  1. Cath, simple, wIinsert Temp Cath; Status:Hold For - Appointment,Date of Service;  Requested for:31May2016;  2. URINE CULTURE; Status:Hold For - Specimen/Data Collection,Appointment; Requested  for:31May2016;  3. Follow-up Schedule Surgery Office  Follow-up  Status: Hold For - Appointment   Requested for: 31May2016  Discussion/Summary BPH with bladder outlet obstruction-on cystoscopy there is some shrinkage of the lateral lobes but he still has a fairly large prostate and an elongated prostatic urethra. I suspect he would do well with a procedure such as greenlight photo vaporization or TURP. Also discussed new or procedure such as urolift. We discussed the risks of outlet procedure such as stricture, bladder neck contracture, incontinence, bleeding and infection. We discussed in most cases urinary retention resolves and urinary flow improves significantly but in some cases the Foley catheter is continued to be needed, also frequency and urgency can remain the same or worsen. All questions answered. He elects to proceed. He was covered with Bactrim today and I did send urine for culture.    Signatures Electronically signed by : Jerilee Field, M.D.; Mar 15 2015  3:08PM EST  Addendum - culture urine culture was negative.

## 2015-04-05 ENCOUNTER — Encounter (HOSPITAL_COMMUNITY)
Admission: RE | Disposition: A | Discharge status: Home / Self Care | Payer: Self-pay | Source: Ambulatory Visit | Attending: Urology

## 2015-04-05 ENCOUNTER — Ambulatory Visit (HOSPITAL_COMMUNITY): Payer: Medicare Other | Admitting: Certified Registered"

## 2015-04-05 ENCOUNTER — Encounter (HOSPITAL_COMMUNITY): Payer: Self-pay | Admitting: *Deleted

## 2015-04-05 ENCOUNTER — Ambulatory Visit (HOSPITAL_COMMUNITY)
Admission: RE | Admit: 2015-04-05 | Discharge: 2015-04-05 | Disposition: A | Discharge status: Home / Self Care | Payer: Medicare Other | Source: Ambulatory Visit | Attending: Urology | Admitting: Urology

## 2015-04-05 DIAGNOSIS — Z87891 Personal history of nicotine dependence: Secondary | ICD-10-CM | POA: Insufficient documentation

## 2015-04-05 DIAGNOSIS — Z7982 Long term (current) use of aspirin: Secondary | ICD-10-CM | POA: Diagnosis not present

## 2015-04-05 DIAGNOSIS — Q541 Hypospadias, penile: Secondary | ICD-10-CM | POA: Diagnosis not present

## 2015-04-05 DIAGNOSIS — Z79899 Other long term (current) drug therapy: Secondary | ICD-10-CM | POA: Diagnosis not present

## 2015-04-05 DIAGNOSIS — N401 Enlarged prostate with lower urinary tract symptoms: Secondary | ICD-10-CM | POA: Diagnosis not present

## 2015-04-05 DIAGNOSIS — G40909 Epilepsy, unspecified, not intractable, without status epilepticus: Secondary | ICD-10-CM | POA: Insufficient documentation

## 2015-04-05 DIAGNOSIS — R338 Other retention of urine: Secondary | ICD-10-CM | POA: Diagnosis not present

## 2015-04-05 DIAGNOSIS — Z8673 Personal history of transient ischemic attack (TIA), and cerebral infarction without residual deficits: Secondary | ICD-10-CM | POA: Insufficient documentation

## 2015-04-05 DIAGNOSIS — N3289 Other specified disorders of bladder: Secondary | ICD-10-CM | POA: Diagnosis not present

## 2015-04-05 DIAGNOSIS — N138 Other obstructive and reflux uropathy: Secondary | ICD-10-CM | POA: Insufficient documentation

## 2015-04-05 HISTORY — PX: GREEN LIGHT LASER TURP (TRANSURETHRAL RESECTION OF PROSTATE: SHX6260

## 2015-04-05 SURGERY — GREEN LIGHT LASER TURP (TRANSURETHRAL RESECTION OF PROSTATE
Anesthesia: General

## 2015-04-05 MED ORDER — LIDOCAINE HCL (CARDIAC) 20 MG/ML IV SOLN
INTRAVENOUS | Status: AC
  Filled 2015-04-05: qty 5

## 2015-04-05 MED ORDER — FENTANYL CITRATE (PF) 100 MCG/2ML IJ SOLN
INTRAMUSCULAR | Status: DC | PRN
  Administered 2015-04-05 (×8): 12.5 ug via INTRAVENOUS

## 2015-04-05 MED ORDER — VANCOMYCIN HCL IN DEXTROSE 1-5 GM/200ML-% IV SOLN
1000.0000 mg | INTRAVENOUS | Status: AC
  Administered 2015-04-05: 1000 mg via INTRAVENOUS
  Filled 2015-04-05: qty 200

## 2015-04-05 MED ORDER — FENTANYL CITRATE (PF) 100 MCG/2ML IJ SOLN: 25.0000 ug | INTRAMUSCULAR | Status: DC | PRN

## 2015-04-05 MED ORDER — DEXAMETHASONE SODIUM PHOSPHATE 10 MG/ML IJ SOLN
INTRAMUSCULAR | Status: DC | PRN
  Administered 2015-04-05: 4 mg via INTRAVENOUS

## 2015-04-05 MED ORDER — EPHEDRINE SULFATE 50 MG/ML IJ SOLN
INTRAMUSCULAR | Status: AC
  Filled 2015-04-05: qty 1

## 2015-04-05 MED ORDER — PROMETHAZINE HCL 25 MG/ML IJ SOLN: 6.2500 mg | INTRAMUSCULAR | Status: DC | PRN

## 2015-04-05 MED ORDER — LIDOCAINE HCL (CARDIAC) 20 MG/ML IV SOLN
INTRAVENOUS | Status: DC | PRN
  Administered 2015-04-05: 50 mg via INTRAVENOUS

## 2015-04-05 MED ORDER — NITROFURANTOIN MONOHYD MACRO 100 MG PO CAPS: 100.0000 mg | ORAL_CAPSULE | Freq: Every day | ORAL | Status: DC

## 2015-04-05 MED ORDER — BELLADONNA ALKALOIDS-OPIUM 16.2-60 MG RE SUPP
RECTAL | Status: AC
  Filled 2015-04-05: qty 1

## 2015-04-05 MED ORDER — FENTANYL CITRATE (PF) 100 MCG/2ML IJ SOLN
INTRAMUSCULAR | Status: AC
  Filled 2015-04-05: qty 2

## 2015-04-05 MED ORDER — DEXAMETHASONE SODIUM PHOSPHATE 10 MG/ML IJ SOLN
INTRAMUSCULAR | Status: AC
  Filled 2015-04-05: qty 1

## 2015-04-05 MED ORDER — LACTATED RINGERS IV SOLN
INTRAVENOUS | Status: DC
  Administered 2015-04-05 (×2): via INTRAVENOUS

## 2015-04-05 MED ORDER — PROPOFOL 10 MG/ML IV BOLUS
INTRAVENOUS | Status: DC | PRN
  Administered 2015-04-05 (×2): 10 mg via INTRAVENOUS
  Administered 2015-04-05: 20 mg via INTRAVENOUS
  Administered 2015-04-05: 80 mg via INTRAVENOUS

## 2015-04-05 MED ORDER — ONDANSETRON HCL 4 MG/2ML IJ SOLN
INTRAMUSCULAR | Status: DC | PRN
  Administered 2015-04-05: 4 mg via INTRAVENOUS

## 2015-04-05 MED ORDER — LACTATED RINGERS IV SOLN: INTRAVENOUS | Status: DC

## 2015-04-05 MED ORDER — PROPOFOL 10 MG/ML IV BOLUS
INTRAVENOUS | Status: AC
  Filled 2015-04-05: qty 20

## 2015-04-05 MED ORDER — MEPERIDINE HCL 50 MG/ML IJ SOLN: 6.2500 mg | INTRAMUSCULAR | Status: DC | PRN

## 2015-04-05 MED ORDER — ONDANSETRON HCL 4 MG/2ML IJ SOLN
INTRAMUSCULAR | Status: AC
  Filled 2015-04-05: qty 2

## 2015-04-05 MED ORDER — BELLADONNA ALKALOIDS-OPIUM 16.2-60 MG RE SUPP
RECTAL | Status: DC | PRN
  Administered 2015-04-05: 1 via RECTAL

## 2015-04-05 MED ORDER — SODIUM CHLORIDE 0.9 % IR SOLN
Status: DC | PRN
  Administered 2015-04-05: 14000 mL/h

## 2015-04-05 MED ORDER — EPHEDRINE SULFATE 50 MG/ML IJ SOLN
INTRAMUSCULAR | Status: DC | PRN
  Administered 2015-04-05: 10 mg via INTRAVENOUS

## 2015-04-05 SURGICAL SUPPLY — 14 items
BAG URINE DRAINAGE (UROLOGICAL SUPPLIES) ×2 IMPLANT
BAG URO CATCHER STRL LF (DRAPE) ×2 IMPLANT
CATH TIEMANN FOLEY 18FR 5CC (CATHETERS) IMPLANT
DRAPE CAMERA CLOSED 9X96 (DRAPES) ×2 IMPLANT
GLOVE BIOGEL M STRL SZ7.5 (GLOVE) ×2 IMPLANT
GOWN STRL REUS W/TWL XL LVL3 (GOWN DISPOSABLE) ×2 IMPLANT
HOLDER FOLEY CATH W/STRAP (MISCELLANEOUS) IMPLANT
LASER FIBER /GREENLIGHT LASER (Laser) ×2 IMPLANT
LASER GREENLIGHT RENTAL P/PROC (Laser) ×2 IMPLANT
MANIFOLD NEPTUNE II (INSTRUMENTS) ×2 IMPLANT
PACK CYSTO (CUSTOM PROCEDURE TRAY) ×2 IMPLANT
SYR 30ML LL (SYRINGE) IMPLANT
SYRINGE IRR TOOMEY STRL 70CC (SYRINGE) IMPLANT
TUBING CONNECTING 10 (TUBING) ×2 IMPLANT

## 2015-04-05 NOTE — Interval H&P Note (Signed)
History and Physical Interval Note:  04/05/2015 8:48 AM  Zachary Ali  has presented today for surgery, with the diagnosis of BPH URINARY RETENTION   The various methods of treatment have been discussed with the patient and family. After consideration of risks, benefits and other options for treatment, the patient has consented to  Procedure(s): GREEN LIGHT LASER TURP (TRANSURETHRAL RESECTION OF PROSTATE (N/A) as a surgical intervention .  The patient's history has been reviewed, patient examined, no change in status, stable for surgery.  I have reviewed the patient's chart and labs. We discussed again the nature, potential benefits, risks and alternatives to laser PVP, including side effects of the proposed treatment, the likelihood of the patient achieving the goals of the procedure, and any potential problems that might occur during the procedure or recuperation. All questions answered. Patient elects to proceed.      Levii Hairfield

## 2015-04-05 NOTE — Op Note (Signed)
Preoperative diagnosis: BPH, urinary retention  Postoperative diagnosis: Same   Procedure: Greenlight photo vaporization of the prostate, exam under anesthesia    Surgeon: Mena Goes   Anesthesia: Gen.   Indication for procedure: 81 year old male and urinary retention with Foley catheter, treated with microwave therapy and office, failed voiding trials. He elects for more definitive procedure with greenlight photo vaporization of the prostate.    Findings: on exam under anesthesia prostate was mildly enlarged but smooth without hard area or nodule. Landmarks preserved. Penis circumcised without mass or lesion. Penile hypospadias developed. Testicles descended bilaterally and palpably normal.    On cystoscopy the urethra was normal. Prostatic urethra was visually obstructed by a elongated lateral lobes and a high median bar. The bladder had severe trabeculation. Bladder mucosa unremarkable. No stones or foreign bodies in the bladder. Trigone and ureteral orifices were in their normal orthotopic position with clear efflux.   Description of procedure: After consent was obtained patient brought to the operating room. After adequate anesthesia placed in lithotomy position and prepped and draped in the usual sterile fashion. A timeout was performed to confirm the patient and procedure. An exam under anesthesia was performed. The laser scope was passed per urethra and the prostatic urethra bladder carefully examined. Given the long lateral lobes I made an incision at 6:00 down to the bladder neck fibers and prostate Carried This down into the Mid Prostate Urethra. After  In assessing the depth I then vaporized the lateral lobes from anterior to posterior bladder neck down to the mid urethra. I then went to the veru and made the typical hockey-stick incisions. Then brought these up and connected with the  Vaporization to the mid urethra. I then worked again anterior to posterior apex working back to the mid  urethra. This connected to  The vaporization and created a nice channel. The bladder was drained. Hemostasis was excellent. The bladder was refilled and some residual tissue was vaporized on both sides. The ureteral orifices were inspected and noted to be normal without any injury. The scope moved much more freely through the prostatic urethra now. Therefore the vaporization was deemed to be complete at the current time. The bladder was refilled and the scope removed.  Placed an 2 French coude  Catheter and filled the balloon with 22 mL and seated at the bladder neck. Irrigation was clear. The patient was awakened taken to recovery room in stable condition.   Complications: None  Blood loss: Minimal  Drains:  18 French catheter   Disposition: Patient stable to PACU.

## 2015-04-05 NOTE — Anesthesia Postprocedure Evaluation (Signed)
  Anesthesia Post-op Note  Patient: Zachary Ali  Procedure(s) Performed: Procedure(s) (LRB): GREEN LIGHT LASER TURP (TRANSURETHRAL RESECTION OF PROSTATE (N/A)  Patient Location: PACU  Anesthesia Type: General  Level of Consciousness: awake and alert   Airway and Oxygen Therapy: Patient Spontanous Breathing  Post-op Pain: mild  Post-op Assessment: Post-op Vital signs reviewed, Patient's Cardiovascular Status Stable, Respiratory Function Stable, Patent Airway and No signs of Nausea or vomiting  Last Vitals:  Filed Vitals:   04/05/15 1206  BP: 140/50  Pulse: 66  Temp: 36.4 C  Resp:     Post-op Vital Signs: stable   Complications: No apparent anesthesia complications

## 2015-04-05 NOTE — Progress Notes (Signed)
Ambulated in hall. Did well. Denies dizziness. Gait steady

## 2015-04-05 NOTE — Anesthesia Procedure Notes (Signed)
Procedure Name: LMA Insertion Date/Time: 04/05/2015 9:41 AM Performed by: Jessica Priest Pre-anesthesia Checklist: Patient identified, Emergency Drugs available, Suction available and Patient being monitored Patient Re-evaluated:Patient Re-evaluated prior to inductionOxygen Delivery Method: Circle System Utilized Preoxygenation: Pre-oxygenation with 100% oxygen Intubation Type: IV induction Ventilation: Mask ventilation without difficulty LMA: LMA inserted LMA Size: 5.0 Number of attempts: 1 Airway Equipment and Method: Bite block Placement Confirmation: positive ETCO2 Tube secured with: Tape Dental Injury: Teeth and Oropharynx as per pre-operative assessment

## 2015-04-05 NOTE — Anesthesia Preprocedure Evaluation (Signed)
Anesthesia Evaluation  Patient identified by MRN, date of birth, ID band Patient awake    Reviewed: Allergy & Precautions, NPO status , Patient's Chart, lab work & pertinent test results  Airway Mallampati: II  TM Distance: >3 FB Neck ROM: Full    Dental no notable dental hx.    Pulmonary former smoker,  breath sounds clear to auscultation  Pulmonary exam normal       Cardiovascular Normal cardiovascular exam+ dysrhythmias Supra Ventricular Tachycardia Rhythm:Regular Rate:Normal     Neuro/Psych CVA, No Residual Symptoms negative psych ROS   GI/Hepatic negative GI ROS, Neg liver ROS,   Endo/Other  negative endocrine ROS  Renal/GU negative Renal ROS  negative genitourinary   Musculoskeletal negative musculoskeletal ROS (+)   Abdominal   Peds negative pediatric ROS (+)  Hematology negative hematology ROS (+)   Anesthesia Other Findings   Reproductive/Obstetrics negative OB ROS                             Anesthesia Physical Anesthesia Plan  ASA: III  Anesthesia Plan: General   Post-op Pain Management:    Induction: Intravenous  Airway Management Planned: LMA  Additional Equipment:   Intra-op Plan:   Post-operative Plan: Extubation in OR  Informed Consent: I have reviewed the patients History and Physical, chart, labs and discussed the procedure including the risks, benefits and alternatives for the proposed anesthesia with the patient or authorized representative who has indicated his/her understanding and acceptance.   Dental advisory given  Plan Discussed with: CRNA  Anesthesia Plan Comments:         Anesthesia Quick Evaluation

## 2015-04-05 NOTE — Transfer of Care (Signed)
Immediate Anesthesia Transfer of Care Note  Patient: Zachary Ali  Procedure(s) Performed: Procedure(s) (LRB): GREEN LIGHT LASER TURP (TRANSURETHRAL RESECTION OF PROSTATE (N/A)  Patient Location: PACU  Anesthesia Type: General  Level of Consciousness: awake, sedated, patient cooperative and responds to stimulation  Airway & Oxygen Therapy: Patient Spontanous Breathing and Patient connected to face mask oxygen  Post-op Assessment: Report given to PACU RN, Post -op Vital signs reviewed and stable and Patient moving all extremities  Post vital signs: Reviewed and stable  Complications: No apparent anesthesia complications

## 2015-04-05 NOTE — Discharge Instructions (Signed)
Foley Catheter Care A Foley catheter is a soft, flexible tube. This tube is placed into your bladder to drain pee (urine). If you go home with this catheter in place, follow the instructions below. TAKING CARE OF THE CATHETER 1. Wash your hands with soap and water. 2. Put soap and water on a clean washcloth.  Clean the skin where the tube goes into your body.  Clean away from the tube site.  Never wipe toward the tube.  Clean the area using a circular motion.  Remove all the soap. Pat the area dry with a clean towel. For males, reposition the skin that covers the end of the penis (foreskin). 3. Attach the tube to your leg with tape or a leg strap. Do not stretch the tube tight. If you are using tape, remove any stickiness left behind by past tape you used. 4. Keep the drainage bag below your hips. Keep it off the floor. 5. Check your tube during the day. Make sure it is working and draining. Make sure the tube does not curl, twist, or bend. 6. Do not pull on the tube or try to take it out. TAKING CARE OF THE DRAINAGE BAGS  Emptying the Drainage Bag Empty your drainage bag when it is  - full or at least 2-3 times a day. 1. Wash your hands with soap and water. 2. Keep the drainage bag below your hips. 3. Hold the dirty bag over the toilet or clean container. 4. Open the pour spout at the bottom of the bag. Empty the pee into the toilet or container. Do not let the pour spout touch anything. 5. Clean the pour spout with a gauze pad or cotton ball that has rubbing alcohol on it. 6. Close the pour spout. 7. Attach the bag to your leg with tape or a leg strap. 8. Wash your hands well.  Cleaning the Drainage Bag 1. Wash your hands with soap and water. 2. Wash the bag in warm, soapy water. 3. Rinse the bag with warm water. 4. Fill the bag with a mixture of white vinegar and water (1 cup vinegar to 1 quart warm water [.2 liter vinegar to 1 liter warm water]). Close the bag and soak it for  30 minutes in the solution. 5. Rinse the bag with warm water. 6. Hang the bag to dry with the pour spout open and hanging downward. 7. Store the clean bag (once it is dry) in a clean plastic bag. 8. Wash your hands well. PREVENT INFECTION  Wash your hands before and after touching your tube.  Take showers every day. Wash the skin where the tube enters your body. Do not take baths. Replace wet leg straps with dry ones, if this applies.  Do not use powders, sprays, or lotions on the genital area. Only use creams, lotions, or ointments as told by your doctor.  For females, wipe from front to back after going to the bathroom.  Drink enough fluids to keep your pee clear or pale yellow unless you are told not to have too much fluid (fluid restriction).  Do not let the drainage bag or tubing touch or lie on the floor.  Wear cotton underwear to keep the area dry. GET HELP IF:  Your pee is cloudy or smells unusually bad.  Your tube becomes clogged.  You are not draining pee into the bag or your bladder feels full.  Your tube starts to leak. GET HELP RIGHT AWAY IF:  You have  pain, puffiness (swelling), redness, or yellowish-white fluid (pus) where the tube enters the body.  You have pain in the belly (abdomen), legs, lower back, or bladder.  You have a fever.  You see blood fill the tube, or your pee is pink or red.  You feel sick to your stomach (nauseous), throw up (vomit), or have chills.  Your tube gets pulled out. MAKE SURE YOU:   Understand these instructions.  Will watch your condition.  Will get help right away if you are not doing well or get worse. Document Released: 01/26/2013 Document Revised: 02/15/2014 Document Reviewed: 01/26/2013 [facility] Patient Information 2015 [place], [place]. This information is not intended to replace advice given to you by your health care provider. Make sure you discuss any questions you have with your health care  provider.     Prostate Laser Surgery, Care After Refer to this sheet in the next few weeks. These instructions provide you with information on caring for yourself after your procedure. Your health care provider may also give you more specific instructions. Your treatment has been planned according to current medical practices, but problems sometimes occur. Call your health care provider if you have any problems or questions after your procedure. WHAT TO EXPECT AFTER THE PROCEDURE 7. You may notice blood in your urine that could last up to 3 weeks. 8. After your catheter is removed, you will have burning (especially at the tip of your penis) when you urinate, especially at the end of urination. For the first few weeks after your procedure, you will feel the need to urinate often. HOME CARE INSTRUCTIONS  9. Do not perform vigorous exercise, especially heavy lifting, for 1 week or as directed by your health care provider. 10. Avoid sexual activity for 4-6 weeks or as directed by your health care provider. 11. Do not ride in a car for extended periods for 1 month or as directed by your health care provider. 12. Do not strain to have a bowel movement. Drink a lot of fluids and and make sure you get enough fiber in your diet. 13. Drink enough fluids to keep your urine clear or pale yellow. SEEK IMMEDIATE MEDICAL CARE IF:  1. Your catheter has been removed and you are suddenly unable to urinate. 2. Your catheter has not been removed, and it develops a blockage. 3. You start to have blood clots in your urine. 4. The blood in your urine becomes persistent or gets thick. 5. Your temperature is greater than 100.56F (38.1C). 6. You develop chest pains. 7. You develop shortness of breath. 8. You develop leg swelling or pain. MAKE SURE YOU: 9. Understand these instructions. 10. Will watch your condition. 11. Will get help right away if you are not doing well or get worse. Document Released:  10/01/2005 Document Revised: 10/06/2013 Document Reviewed: 03/23/2013 [facility] Patient Information 2015 [place], [place]. This information is not intended to replace advice given to you by your health care provider. Make sure you discuss any questions you have with your health care provider.      POST-ANESTHESIA  IMMEDIATELY FOLLOWING SURGERY:  Do not drive or operate machinery for the first twenty four hours after surgery.  Do not make any important decisions for twenty four hours after surgery or while taking narcotic pain medications or sedatives.  If you develop intractable nausea and vomiting or a severe headache please notify your doctor immediately.  FOLLOW-UP:  Please make an appointment with your surgeon as instructed.    QUESTIONS?:  Please feel free to call your physician  if you have any questions, and they will be happy to assist you.

## 2015-04-06 ENCOUNTER — Encounter (HOSPITAL_COMMUNITY): Payer: Self-pay | Admitting: Urology

## 2015-08-30 ENCOUNTER — Ambulatory Visit (INDEPENDENT_AMBULATORY_CARE_PROVIDER_SITE_OTHER): Payer: Medicare Other | Admitting: Nurse Practitioner

## 2015-08-30 ENCOUNTER — Encounter: Payer: Self-pay | Admitting: Nurse Practitioner

## 2015-08-30 VITALS — BP 139/65 | HR 68 | Ht 68.0 in | Wt 180.2 lb

## 2015-08-30 DIAGNOSIS — Z5181 Encounter for therapeutic drug level monitoring: Secondary | ICD-10-CM | POA: Diagnosis not present

## 2015-08-30 DIAGNOSIS — G40309 Generalized idiopathic epilepsy and epileptic syndromes, not intractable, without status epilepticus: Secondary | ICD-10-CM

## 2015-08-30 MED ORDER — PHENYTOIN SODIUM EXTENDED 100 MG PO CAPS: ORAL_CAPSULE | ORAL | Status: DC

## 2015-08-30 MED ORDER — DIVALPROEX SODIUM 500 MG PO DR TAB: DELAYED_RELEASE_TABLET | ORAL | Status: DC

## 2015-08-30 NOTE — Patient Instructions (Signed)
Continue Dilantin and Depakote at current dose Get VPA and Dilantin level F/U yearly

## 2015-08-30 NOTE — Progress Notes (Signed)
[facility]  PATIENT: Zachary Ali DOB: 10-17-1928   REASON FOR VISIT: Follow-up for seizure disorder HISTORY FROM: Patient and wife    HISTORY OF PRESENT ILLNESS:Mr. 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 He was last seen 08/27/2014. No seizures in several years. Denies any falls, using a single-point cane. Patient remains on Dilantin and Depakote. Discussed again stopping Dilantin and adding Keppra but pt is reluctant. No new complaints. No new neurologic complaints. Reviewed  CBC BMP from hospital admission in June for TURP.      HISTORY: initial evaluation by Dr Terrace Arabia 08/17/10. He is referred to this clinic, because there was no longer recurrent seizure, and with this worsening gait difficulty, wondering if we can make some adjustment of his epileptiic medications  PMHx.He has past medical history of tachycardia is taking beta blocker, also has a history of seizure, has been on long-term epileptic medications. He began to experience seizures since age 66, initially it was frequent Petit mal seizure, up to 12-16 episodes in a day, he has been treated with combination of Dilantin, and phenobarbital since age 85, without effectively control his petit mal seizure. In addition he also has some infrequent generalized tonic-clonic seizure. In 1976, he was started on Depakote, which has drastically change his seizure, his petit mal seizure was under much better control, while he was on Depakote and phenobarbital combination, he developed a generalized tonic-clonic seizure, Dilantin was reintroduced later, he has been on current dose of 3 medication for 35 years. Dilantin100 mg t.i.d. phenobarbital 100 mg every morning. Depakote500 mg t.i.d.  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 Over the past couple years, he was noticed to have  unsteady gait. he denied incontinence, bilateral lower extremity paresthesia. He has successfully weaned off phenobarbital now, he feels better afterwards, there is no recurrent seizure. He is continue taking depakote  tid, dilantin 100 tid. MRI brain showed mild atrophy, no acute lesions. EEG, had mild background slowing, no epilepsy discharges. he is still on Depakote 500 mg 3 times a day, Dilantin 100 mg 3 times a day, no recurrent seizure, He was admitted to the hospital June 24 2014, for worsening confusion, dysarthria, was diagnosed with UTI, repeat MRI of the brain showed no acute lesions, MRA showed intracranial atherosclerotic disease, he was started on baby aspirin daily, He was discharged home after rehabilitation,overall doing very well,  Depakote level in hospital September 2015, 54.8, Dilantin was 10    REVIEW OF SYSTEMS: Full 14 system review of systems performed and notable only for those listed, all others are neg:  Constitutional: neg  Cardiovascular: neg Ear/Nose/Throat: neg  Skin: neg Eyes: neg Respiratory: neg Gastroitestinal: neg  Hematology/Lymphatic: neg  Endocrine: neg Musculoskeletal:neg Allergy/Immunology: neg Neurological: neg Psychiatric: neg Sleep : neg   ALLERGIES: No Known Allergies  HOME MEDICATIONS: Outpatient Prescriptions Prior to Visit  Medication Sig Dispense Refill  . acetaminophen (TYLENOL) 325 MG tablet Take 2 tablets (650 mg total) by mouth every 6 (six) hours as needed for mild pain, moderate pain, fever or headache (or Fever >/= 101).    Marland Kitchen aspirin EC 81 MG EC tablet Take 1 tablet (81 mg total) by mouth daily.    Marland Kitchen DILANTIN 100 MG ER capsule TAKE (1) CAPSULE THREE TIMES DAILY. (Patient taking differently: Take 1 capsule 3 times daily.) 90 capsule 11  . divalproex (DEPAKOTE) 500 MG DR tablet TAKE 1  TABLET 3 TIMES A DAY. (Patient taking differently: Take 1 tablet 3 times a day.) 90 tablet 11  . finasteride (PROSCAR) 5 MG  tablet Take 5 mg by mouth every morning.   2  . metoprolol succinate (TOPROL-XL) 25 MG 24 hr tablet Take 25 mg by mouth every morning.     . nitrofurantoin, macrocrystal-monohydrate, (MACROBID) 100 MG capsule Take 1 capsule (100 mg total) by mouth at bedtime. 7 capsule 0  . potassium chloride (KLOR-CON) 8 MEQ tablet Take 8 mEq by mouth daily. Monday, Wednesday, and Friday.  5  . tamsulosin (FLOMAX) 0.4 MG CAPS capsule Take 1 capsule (0.4 mg total) by mouth daily. (Patient taking differently: Take 0.4 mg by mouth every evening. ) 30 capsule 0  . furosemide (LASIX) 20 MG tablet 40 mg daily. Monday, Wednesday, and Friday.  5   No facility-administered medications prior to visit.    PAST MEDICAL HISTORY: Past Medical History  Diagnosis Date  . Seizure [facility](HCC)     since childhood, Dr. Debarah CrapeYen; off phenoparb since 2011, after many yr, doing ok  . Small vessel disease (HCC)   . Allergic rhinitis   . BPH (benign prostatic hypertrophy)     seen urology, Dr. Earlene PlaterAVIS  . SVT (supraventricular tachycardia) (HCC)   . Elevated PSA     dr. Earlene Platerdavis PSA 3-6; no PSA-urology f/u for exam yrly  . Dyslipidemia     diet control  . Edema leg     hx of  . Insomnia   . OA (osteoarthritis) of knee     left   . Bronchitis     episode of 2/10  . Palpitations     w/u with Card-Dr. Anne FuSkains  . Paroxysmal supraventricular tachycardia (HCC)   . Cerebral hemorrhage (HCC)     approx 50 years ago  . Stroke [facility](HCC)     hx of CVA  . SOB (shortness of breath)     walking distances/climbing stairs  . Pneumonia     hx of   . Urinary tract infection     hx of   . Foley catheter in place     PAST SURGICAL HISTORY: Past Surgical History  Procedure Laterality Date  . Vasectomy    . [place] echocardiography      ok; stress test ok-05-2013 nl LVF, tr AR  . Hemorrhoid surgery    . Cataract extraction      2011-12/left   . Green light laser turp (transurethral resection of prostate N/A 04/05/2015    Procedure: GREEN LIGHT LASER  TURP (TRANSURETHRAL RESECTION OF PROSTATE;  Surgeon: Jerilee FieldMatthew Eskridge, MD;  Location: [facility];  Service: Urology;  Laterality: N/A;    FAMILY HISTORY: Family History  Problem Relation Age of Onset  . Heart failure    . Heart disease Mother   . Coronary artery disease Mother   . Coronary artery disease Father   . Heart disease Father     SOCIAL HISTORY: Social History   Social History  . Marital Status: Married    Spouse Name: Hope  . Number of Children: 0  . Years of Education: College   Occupational History  . Not on file.   Social History Main Topics  . Smoking status: Former Smoker -- 2.00 packs/day for 20 years    Types: Cigarettes    Quit date: 10/15/1974  . Smokeless tobacco: Never Used  . Alcohol Use: 3.0 oz/week    5 Glasses of wine per week     Comment:  occas   . Drug Use: No  . Sexual Activity: Not on file   Other Topics Concern  . Not on file   Social History Narrative   Patient lives at home with wife [place].    Patient has no children.    Patient is college graduated.    Patient is retired.    Patient is right handed.            PHYSICAL EXAM  Filed Vitals:   08/30/15 1352  BP: 139/65  Pulse: 68  Height:  (1.727 m)  Weight: 180 lb 3.2 oz (81.738 kg)   Body mass index is 27.41 kg/(m^2). Generalized: Well developed, in no acute distress   Neurological examination   Mentation: Alert oriented to time, place, history taking. Follows all commands speech and language fluent Cranial nerve II-XII: Pupils were equal round reactive to light extraocular movements were full, visual field were full on confrontational test. Facial sensation and strength were normal. hearing was intact to finger rubbing bilaterally. Uvula tongue midline. head turning and shoulder shrug and were normal and symmetric.Tongue protrusion into cheek strength was normal. Motor: normal bulk and tone, full strength in the BUE, BLE, fine finger movements normal, no pronator  drift. No focal weakness Coordination: finger-nose-finger, heel-to-shin bilaterally, no dysmetria Reflexes: Diminished and symmetric upper and lower Gait and Station: Rising up from seated position by pushing on chair arm,, wide based cautious gait no difficulty with turning, ambulates with single-point cane . Tandem gait not attempted   DIAGNOSTIC DATA (LABS, IMAGING, TESTING) - I reviewed patient records, labs, notes, testing and imaging myself where available.  Lab Results  Component Value Date   WBC 8.5 03/30/2015   HGB 12.5* 03/30/2015   HCT 38.3* 03/30/2015   MCV 96.0 03/30/2015   PLT 139* 03/30/2015      Component Value Date/Time   NA 143 03/30/2015 1540   NA 142 08/26/2013 0905   K 4.4 03/30/2015 1540   CL 98* 03/30/2015 1540   CO2 25 03/30/2015 1540   GLUCOSE 95 03/30/2015 1540   GLUCOSE 90 08/26/2013 0905   BUN 35* 03/30/2015 1540   BUN 21 08/26/2013 0905   CREATININE 0.93 03/30/2015 1540   CALCIUM 9.8 03/30/2015 1540   PROT 6.0 06/25/2014 1901   PROT 6.2 08/26/2013 0905   ALBUMIN 2.8* 06/25/2014 1901   ALBUMIN 3.9 08/26/2013 0905   AST 23 06/25/2014 1901   ALT 17 06/25/2014 1901   ALKPHOS 61 06/25/2014 1901   BILITOT 0.3 06/25/2014 1901   GFRNONAA >60 03/30/2015 1540   GFRAA >60 03/30/2015 1540    ASSESSMENT AND PLAN  81 y.o. year old male  has a past medical history of Seizure (HCC); Small vessel disease (HCC); . here to follow-up. No seizure activity in several years  Continue Dilantin and Depakote at current dose will refill Get VPA and Dilantin level F/U yearly Call for any seizure activity Nilda Riggs, [facility], [facility], APRN  [facility] [address] [place], [place] [address] [phone]

## 2015-08-31 ENCOUNTER — Telehealth: Payer: Self-pay | Admitting: *Deleted

## 2015-08-31 LAB — VALPROIC ACID LEVEL: Valproic Acid Lvl: 42 ug/mL — ABNORMAL LOW (ref 50–100)

## 2015-08-31 LAB — PHENYTOIN LEVEL, TOTAL: PHENYTOIN (DILANTIN), SERUM: 6.5 ug/mL — AB (ref 10.0–20.0)

## 2015-08-31 NOTE — Telephone Encounter (Signed)
-----   Message from Nilda RiggsNancy Carolyn Martin, NP sent at 08/31/2015  1:20 PM EST ----- Labs ok no seizures in several years. Please call the patient

## 2015-08-31 NOTE — Telephone Encounter (Signed)
I called and spoke to pt and relayed the message below relating to lab results.  Labs ok.  He verbalized understanding.

## 2015-09-07 NOTE — Progress Notes (Signed)
I have reviewed and agreed above plan. 

## 2016-01-19 ENCOUNTER — Telehealth: Payer: Self-pay | Admitting: Nurse Practitioner

## 2016-01-19 NOTE — Telephone Encounter (Signed)
PA notification received. PA started.

## 2016-01-19 NOTE — Telephone Encounter (Signed)
Rn call patient about patients dilantin medication. Pts wife stated he receive a last refill a month ago, and they change insurances. Rn stated a call will be made to the insurance company about the medication.

## 2016-01-19 NOTE — Telephone Encounter (Signed)
Spouse called to advise insurance has denied coverage for phenytoin (DILANTIN) 100 MG ER capsule, please call to advise [phone].

## 2016-01-19 NOTE — Telephone Encounter (Signed)
PA submitted, waiting for response. Key: [id number]

## 2016-01-20 ENCOUNTER — Telehealth: Payer: Self-pay | Admitting: Nurse Practitioner

## 2016-01-20 NOTE — Telephone Encounter (Signed)
Noted  

## 2016-01-20 NOTE — Telephone Encounter (Signed)
Zachary Ali with [facility] [employer] called stating pts medication for phenytoin (DILANTIN) 100 MG ER capsule has been approved as of 01/19/16 for one year

## 2016-01-24 NOTE — Telephone Encounter (Signed)
Dilantin PA approve from 01/19/2016 to 01/18/2017 for [employer].

## 2016-01-25 ENCOUNTER — Telehealth: Payer: Self-pay | Admitting: *Deleted

## 2016-01-25 NOTE — Telephone Encounter (Signed)
ID# [id number]  Approval for non formulary exception request for Dilantin 100mg  caps.    Good until 01-18-2017  [phone].

## 2016-03-14 DIAGNOSIS — M1990 Unspecified osteoarthritis, unspecified site: Secondary | ICD-10-CM | POA: Diagnosis not present

## 2016-03-14 DIAGNOSIS — R7309 Other abnormal glucose: Secondary | ICD-10-CM | POA: Diagnosis not present

## 2016-03-14 DIAGNOSIS — D649 Anemia, unspecified: Secondary | ICD-10-CM | POA: Diagnosis not present

## 2016-03-14 DIAGNOSIS — I471 Supraventricular tachycardia: Secondary | ICD-10-CM | POA: Diagnosis not present

## 2016-03-14 DIAGNOSIS — D696 Thrombocytopenia, unspecified: Secondary | ICD-10-CM | POA: Diagnosis not present

## 2016-03-14 DIAGNOSIS — N401 Enlarged prostate with lower urinary tract symptoms: Secondary | ICD-10-CM | POA: Diagnosis not present

## 2016-03-14 DIAGNOSIS — E782 Mixed hyperlipidemia: Secondary | ICD-10-CM | POA: Diagnosis not present

## 2016-03-14 DIAGNOSIS — R600 Localized edema: Secondary | ICD-10-CM | POA: Diagnosis not present

## 2016-03-14 DIAGNOSIS — R569 Unspecified convulsions: Secondary | ICD-10-CM | POA: Diagnosis not present

## 2016-03-14 DIAGNOSIS — Z0000 Encounter for general adult medical examination without abnormal findings: Secondary | ICD-10-CM | POA: Diagnosis not present

## 2016-07-04 DIAGNOSIS — Z23 Encounter for immunization: Secondary | ICD-10-CM | POA: Diagnosis not present

## 2016-07-04 DIAGNOSIS — T148 Other injury of unspecified body region: Secondary | ICD-10-CM | POA: Diagnosis not present

## 2016-07-04 DIAGNOSIS — R413 Other amnesia: Secondary | ICD-10-CM | POA: Diagnosis not present

## 2016-08-28 ENCOUNTER — Ambulatory Visit (INDEPENDENT_AMBULATORY_CARE_PROVIDER_SITE_OTHER): Payer: Medicare Other | Admitting: Nurse Practitioner

## 2016-08-28 ENCOUNTER — Encounter: Payer: Self-pay | Admitting: Nurse Practitioner

## 2016-08-28 VITALS — BP 128/75 | HR 68 | Ht 68.0 in | Wt 192.6 lb

## 2016-08-28 DIAGNOSIS — G40309 Generalized idiopathic epilepsy and epileptic syndromes, not intractable, without status epilepticus: Secondary | ICD-10-CM

## 2016-08-28 DIAGNOSIS — N400 Enlarged prostate without lower urinary tract symptoms: Secondary | ICD-10-CM | POA: Diagnosis not present

## 2016-08-28 DIAGNOSIS — R972 Elevated prostate specific antigen [PSA]: Secondary | ICD-10-CM | POA: Diagnosis not present

## 2016-08-28 DIAGNOSIS — G40909 Epilepsy, unspecified, not intractable, without status epilepticus: Secondary | ICD-10-CM | POA: Diagnosis not present

## 2016-08-28 MED ORDER — DIVALPROEX SODIUM 500 MG PO DR TAB: DELAYED_RELEASE_TABLET | ORAL | 11 refills | Status: DC

## 2016-08-28 MED ORDER — PHENYTOIN SODIUM EXTENDED 100 MG PO CAPS: ORAL_CAPSULE | ORAL | 11 refills | Status: DC

## 2016-08-28 NOTE — Progress Notes (Signed)
[facility]  PATIENT: Zachary Ali DOB: Apr 08, 1929   REASON FOR VISIT: Follow-up for seizure disorder HISTORY FROM: Patient and wife    HISTORY OF PRESENT ILLNESS: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  No seizures in several years. Denies any falls, using a single-point cane. Patient remains on Dilantin and Depakote. Discussed again stopping Dilantin and adding Keppra but pt is reluctant.  No new neurologic complaints.       HISTORY: initial evaluation by Dr Terrace ArabiaYan 08/17/10. He is referred to this clinic, because there was no longer recurrent seizure, and with this worsening gait difficulty, wondering if we can make some adjustment of his epileptiic medications  PMHx.He has past medical history of tachycardia is taking beta blocker, also has a history of seizure, has been on long-term epileptic medications. He began to experience seizures since age 82, initially it was frequent Petit mal seizure, up to 12-16 episodes in a day, he has been treated with combination of Dilantin, and phenobarbital since age 82, without effectively control his petit mal seizure. In addition he also has some infrequent generalized tonic-clonic seizure. In 1976, he was started on Depakote, which has drastically change his seizure, his petit mal seizure was under much better control, while he was on Depakote and phenobarbital combination, he developed a generalized tonic-clonic seizure, Dilantin was reintroduced later, he has been on current dose of 3 medication for 35 years. Dilantin100 mg t.i.d. phenobarbital 100 mg every morning. Depakote500 mg t.i.d.  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 Over the past couple years, he was noticed to have unsteady gait. he denied incontinence, bilateral lower extremity paresthesia. He has successfully  weaned off phenobarbital now, he feels better afterwards, there is no recurrent seizure. He is continue taking depakote 500mg  tid, dilantin 100 tid. MRI brain showed mild atrophy, no acute lesions. EEG, had mild background slowing, no epilepsy discharges. he is still on Depakote 500 mg 3 times a day, Dilantin 100 mg 3 times a day, no recurrent seizure, He was admitted to the hospital June 24 2014, for worsening confusion, dysarthria, was diagnosed with UTI, repeat MRI of the brain showed no acute lesions, MRA showed intracranial atherosclerotic disease, he was started on baby aspirin daily, He was discharged home after rehabilitation,overall doing very well,  Depakote level in hospital September 2015, 54.8, Dilantin was 10    REVIEW OF SYSTEMS: Full 14 system review of systems performed and notable only for those listed, all others are neg:  Constitutional: neg  Cardiovascular: neg Ear/Nose/Throat: neg  Skin: neg Eyes: neg Respiratory: neg Gastroitestinal: neg  Hematology/Lymphatic: neg  Endocrine: neg Musculoskeletal:neg Allergy/Immunology: neg Neurological: Memory loss Psychiatric: neg Sleep : daytime drowsiness   ALLERGIES: No Known Allergies  HOME MEDICATIONS: Outpatient Medications Prior to Visit  Medication Sig Dispense Refill  . acetaminophen (TYLENOL) 325 MG tablet Take 2 tablets (650 mg total) by mouth every 6 (six) hours as needed for mild pain, moderate pain, fever or headache (or Fever >/= 101).    Marland Kitchen. aspirin EC 81 MG EC tablet Take 1 tablet (81 mg total) by mouth daily.    . divalproex (DEPAKOTE) 500 MG DR tablet TAKE 1 TABLET 3 TIMES A DAY. 90 tablet 11  . finasteride (PROSCAR) 5 MG tablet Take 5 mg by mouth every morning.   2  . furosemide (LASIX) 40 MG tablet Take 40 mg by mouth daily.  10  . metoprolol succinate (TOPROL-XL) 25 MG 24 hr tablet Take 25 mg by mouth every morning.     . nitrofurantoin, macrocrystal-monohydrate, (MACROBID) 100 MG capsule  Take 1 capsule (100 mg total) by mouth at bedtime. 7 capsule 0  . phenytoin (DILANTIN) 100 MG ER capsule TAKE (1) CAPSULE THREE TIMES DAILY. 90 capsule 11  . potassium chloride (KLOR-CON) 8 MEQ tablet Take 8 mEq by mouth daily. Monday, Wednesday, and Friday.  5  . tamsulosin (FLOMAX) 0.4 MG CAPS capsule Take 1 capsule (0.4 mg total) by mouth daily. (Patient taking differently: Take 0.4 mg by mouth every evening. ) 30 capsule 0   No facility-administered medications prior to visit.     PAST MEDICAL HISTORY: Past Medical History:  Diagnosis Date  . Allergic rhinitis   . BPH (benign prostatic hypertrophy)    seen urology, Dr. Earlene Plater  . Bronchitis    episode of 2/10  . Cerebral hemorrhage (HCC)    approx 50 years ago  . Dyslipidemia    diet control  . Edema leg    hx of  . Elevated PSA    dr. Earlene Plater PSA 3-6; no PSA-urology f/u for exam yrly  . Foley catheter in place   . Insomnia   . OA (osteoarthritis) of knee    left   . Palpitations    w/u with Card-Dr. Anne Fu  . Paroxysmal supraventricular tachycardia (HCC)   . Pneumonia    hx of   . Seizure [facility])    since childhood, Dr. Debarah Crape; off phenoparb since 2011, after many yr, doing ok  . Small vessel disease   . SOB (shortness of breath)    walking distances/climbing stairs  . Stroke [facility])    hx of CVA  . SVT (supraventricular tachycardia) (HCC)   . Urinary tract infection    hx of     PAST SURGICAL HISTORY: Past Surgical History:  Procedure Laterality Date  . CATARACT EXTRACTION     2011-12/left   . GREEN LIGHT LASER TURP (TRANSURETHRAL RESECTION OF PROSTATE N/A 04/05/2015   Procedure: GREEN LIGHT LASER TURP (TRANSURETHRAL RESECTION OF PROSTATE;  Surgeon: Jerilee Field, MD;  Location: [facility];  Service: Urology;  Laterality: N/A;  . HEMORRHOID SURGERY    . US ECHOCARDIOGRAPHY     ok; stress test ok-05-2013 nl LVF, tr AR  . VASECTOMY      FAMILY HISTORY: Family History  Problem Relation Age of Onset  . Heart failure     . Heart disease Mother   . Coronary artery disease Mother   . Coronary artery disease Father   . Heart disease Father     SOCIAL HISTORY: Social History   Social History  . Marital status: Married    Spouse name: Hope  . Number of children: 0  . Years of education: College   Occupational History  . Not on file.   Social History Main Topics  . Smoking status: Former Smoker    Packs/day: 2.00    Years: 20.00    Types: Cigarettes    Quit date: 10/15/1974  . Smokeless tobacco: Never Used  . Alcohol use 3.0 oz/week    5 Glasses of wine per week     Comment: occas   . Drug use: No  . Sexual activity: Not on file   Other Topics Concern  . Not on file   Social History Narrative   Patient lives at home with wife [place].    Patient has no children.  Patient is college graduated.    Patient is retired.    Patient is right handed.            PHYSICAL EXAM  Vitals:   08/28/16 1257  BP: 128/75  Pulse: 68  Weight: 192 lb 10.1 oz (87.4 kg)  Height: 5\' 8"  (1.727 m)   Body mass index is 29.29 kg/m. Generalized: Well developed, in no acute distress   Neurological examination   Mentation: Alert oriented to time, place, history taking. Follows all commands speech and language fluent Cranial nerve II-XII: Pupils were equal round reactive to light extraocular movements were full, visual field were full on confrontational test. Facial sensation and strength were normal. hearing was intact to finger rubbing bilaterally. Uvula tongue midline. head turning and shoulder shrug and were normal and symmetric.Tongue protrusion into cheek strength was normal. Motor: normal bulk and tone, full strength in the BUE, BLE, fine finger movements normal, no pronator drift. No focal weakness Coordination: finger-nose-finger, heel-to-shin bilaterally, no dysmetria Reflexes: Diminished and symmetric upper and lower Gait and Station: Rising up from seated position by pushing on chair arm,, wide  based cautious gait no difficulty with turning, ambulates with single-point cane . Tandem gait not attempted   DIAGNOSTIC DATA (LABS, IMAGING, TESTING) - I reviewed patient records, labs, notes, testing and imaging myself where available.  Lab Results  Component Value Date   WBC 8.5 03/30/2015   HGB 12.5 (L) 03/30/2015   HCT 38.3 (L) 03/30/2015   MCV 96.0 03/30/2015   PLT 139 (L) 03/30/2015      Component Value Date/Time   NA 143 03/30/2015 1540   NA 142 08/26/2013 0905   K 4.4 03/30/2015 1540   CL 98 (L) 03/30/2015 1540   CO2 25 03/30/2015 1540   GLUCOSE 95 03/30/2015 1540   BUN 35 (H) 03/30/2015 1540   BUN 21 08/26/2013 0905   CREATININE 0.93 03/30/2015 1540   CALCIUM 9.8 03/30/2015 1540   PROT 6.0 06/25/2014 1901   PROT 6.2 08/26/2013 0905   ALBUMIN 2.8 (L) 06/25/2014 1901   ALBUMIN 3.9 08/26/2013 0905   AST 23 06/25/2014 1901   ALT 17 06/25/2014 1901   ALKPHOS 61 06/25/2014 1901   BILITOT 0.3 06/25/2014 1901   GFRNONAA >60 03/30/2015 1540   GFRAA >60 03/30/2015 1540    ASSESSMENT AND PLAN  82 y.o. year old male  has a past medical history of Seizure (HCC); Small vessel disease (HCC); . here to follow-up. No seizure activity in several years  Continue Dilantin and Depakote at current dose will refill Get VPA and Dilantin level F/U yearly , next with Dr Terrace ArabiaYan Call for any seizure activity Nilda RiggsNancy Carolyn Pinkie Manger, [facility]NP, [facility], APRN  [facility] [address], [address] [place], [place] [id number] [phone]

## 2016-08-28 NOTE — Patient Instructions (Signed)
Continue Dilantin and Depakote at current dose will refill Get VPA and Dilantin level F/U yearly Call for any seizure activity

## 2016-08-29 ENCOUNTER — Telehealth: Payer: Self-pay | Admitting: *Deleted

## 2016-08-29 LAB — PHENYTOIN LEVEL, TOTAL: PHENYTOIN (DILANTIN), SERUM: 7 ug/mL — AB (ref 10.0–20.0)

## 2016-08-29 LAB — VALPROIC ACID LEVEL: Valproic Acid Lvl: 60 ug/mL (ref 50–100)

## 2016-08-29 NOTE — Telephone Encounter (Signed)
-----   Message from Nilda RiggsNancy Carolyn Martin, NP sent at 08/29/2016  8:05 AM EST ----- Labs look good. Please call patient

## 2016-08-29 NOTE — Telephone Encounter (Signed)
Spoke to pt and relayed the results (labs are good)  to him and his wife.  They both verbalized understanding. Per wife, pt completed course of nitrofuratoin and is not taking flomax anymore, only the finasteride.  Updated med list.

## 2016-08-29 NOTE — Progress Notes (Signed)
I have reviewed and agreed above plan. 

## 2016-09-13 DIAGNOSIS — D696 Thrombocytopenia, unspecified: Secondary | ICD-10-CM | POA: Diagnosis not present

## 2016-09-13 DIAGNOSIS — M1990 Unspecified osteoarthritis, unspecified site: Secondary | ICD-10-CM | POA: Diagnosis not present

## 2016-09-13 DIAGNOSIS — R600 Localized edema: Secondary | ICD-10-CM | POA: Diagnosis not present

## 2016-09-13 DIAGNOSIS — R569 Unspecified convulsions: Secondary | ICD-10-CM | POA: Diagnosis not present

## 2016-09-13 DIAGNOSIS — I471 Supraventricular tachycardia: Secondary | ICD-10-CM | POA: Diagnosis not present

## 2016-09-13 DIAGNOSIS — N401 Enlarged prostate with lower urinary tract symptoms: Secondary | ICD-10-CM | POA: Diagnosis not present

## 2016-11-07 DIAGNOSIS — J209 Acute bronchitis, unspecified: Secondary | ICD-10-CM | POA: Diagnosis not present

## 2017-01-21 ENCOUNTER — Telehealth: Payer: Self-pay | Admitting: *Deleted

## 2017-01-21 NOTE — Telephone Encounter (Signed)
Called [employer] [place] to begin PA for Dilantin. Spoke with Zachary Ali who will fax PA form to initiate PA.

## 2017-01-23 NOTE — Telephone Encounter (Signed)
Received fax from [place] [employer] [place] approved until 01/21/2018.  LVM for his [facility]  with PA information.

## 2017-03-19 DIAGNOSIS — R569 Unspecified convulsions: Secondary | ICD-10-CM | POA: Diagnosis not present

## 2017-03-19 DIAGNOSIS — I471 Supraventricular tachycardia: Secondary | ICD-10-CM | POA: Diagnosis not present

## 2017-03-19 DIAGNOSIS — D696 Thrombocytopenia, unspecified: Secondary | ICD-10-CM | POA: Diagnosis not present

## 2017-03-19 DIAGNOSIS — Z0000 Encounter for general adult medical examination without abnormal findings: Secondary | ICD-10-CM | POA: Diagnosis not present

## 2017-04-02 DIAGNOSIS — Z1211 Encounter for screening for malignant neoplasm of colon: Secondary | ICD-10-CM | POA: Diagnosis not present

## 2017-05-02 DIAGNOSIS — H52203 Unspecified astigmatism, bilateral: Secondary | ICD-10-CM | POA: Diagnosis not present

## 2017-08-08 ENCOUNTER — Other Ambulatory Visit: Payer: Self-pay | Admitting: Internal Medicine

## 2017-08-08 ENCOUNTER — Ambulatory Visit
Admission: RE | Admit: 2017-08-08 | Discharge: 2017-08-08 | Disposition: A | Discharge status: Home / Self Care | Payer: Medicare Other | Source: Ambulatory Visit | Attending: Internal Medicine | Admitting: Internal Medicine

## 2017-08-08 DIAGNOSIS — M7732 Calcaneal spur, left foot: Secondary | ICD-10-CM | POA: Diagnosis not present

## 2017-08-08 DIAGNOSIS — M79671 Pain in right foot: Secondary | ICD-10-CM | POA: Diagnosis not present

## 2017-08-08 DIAGNOSIS — Z23 Encounter for immunization: Secondary | ICD-10-CM | POA: Diagnosis not present

## 2017-08-26 DIAGNOSIS — N401 Enlarged prostate with lower urinary tract symptoms: Secondary | ICD-10-CM | POA: Diagnosis not present

## 2017-08-26 DIAGNOSIS — R3914 Feeling of incomplete bladder emptying: Secondary | ICD-10-CM | POA: Diagnosis not present

## 2017-09-03 ENCOUNTER — Encounter: Payer: Self-pay | Admitting: Neurology

## 2017-09-03 ENCOUNTER — Ambulatory Visit: Payer: Medicare Other | Admitting: Neurology

## 2017-09-03 VITALS — BP 155/78 | HR 77 | Ht 68.0 in | Wt 201.0 lb

## 2017-09-03 DIAGNOSIS — R269 Unspecified abnormalities of gait and mobility: Secondary | ICD-10-CM | POA: Diagnosis not present

## 2017-09-03 DIAGNOSIS — G3184 Mild cognitive impairment, so stated: Secondary | ICD-10-CM | POA: Diagnosis not present

## 2017-09-03 DIAGNOSIS — G40309 Generalized idiopathic epilepsy and epileptic syndromes, not intractable, without status epilepticus: Secondary | ICD-10-CM

## 2017-09-03 MED ORDER — DONEPEZIL HCL 10 MG PO TABS: 10.0000 mg | ORAL_TABLET | Freq: Every day | ORAL | 11 refills | Status: DC

## 2017-09-03 MED ORDER — MEMANTINE HCL 10 MG PO TABS: 10.0000 mg | ORAL_TABLET | Freq: Two times a day (BID) | ORAL | 11 refills | Status: DC

## 2017-09-03 MED ORDER — PHENYTOIN SODIUM EXTENDED 100 MG PO CAPS: ORAL_CAPSULE | ORAL | 4 refills | Status: DC

## 2017-09-03 MED ORDER — DIVALPROEX SODIUM 500 MG PO DR TAB: DELAYED_RELEASE_TABLET | ORAL | 4 refills | Status: DC

## 2017-09-03 NOTE — Progress Notes (Signed)
[facility]  PATIENT: LAM MCCUBBINS DOB: 11-21-1928   REASON FOR VISIT: Follow-up for seizure disorder HISTORY FROM: Patient and wife  HISTORY OF PRESENT ILLNESS:Mr. 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  No seizures in several years. Denies any falls, using a single-point cane. Patient remains on Dilantin and Depakote. Discussed again stopping Dilantin and adding Keppra but pt is reluctant.  No new neurologic complaints.       HISTORY: initial evaluation by Dr Terrace Arabia 08/17/10. He is referred to this clinic, because there was no longer recurrent seizure, and with this worsening gait difficulty, wondering if we can make some adjustment of his epileptiic medications  PMHx.He has past medical history of tachycardia is taking beta blocker, also has a history of seizure, has been on long-term epileptic medications. He began to experience seizures since age 64, initially it was frequent Petit mal seizure, up to 12-16 episodes in a day, he has been treated with combination of Dilantin, and phenobarbital since age 32, without effectively control his petit mal seizure. In addition he also has some infrequent generalized tonic-clonic seizure. In 1976, he was started on Depakote, which has drastically change his seizure, his petit mal seizure was under much better control, while he was on Depakote and phenobarbital combination, he developed a generalized tonic-clonic seizure, Dilantin was reintroduced later, he has been on current dose of 3 medication for 35 years. Dilantin100 mg t.i.d. phenobarbital 100 mg every morning. Depakote500 mg t.i.d.  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 Over the past couple years, he was noticed to have unsteady gait. he denied incontinence, bilateral lower extremity paresthesia. He has successfully weaned  off phenobarbital now, he feels better afterwards, there is no recurrent seizure. He is continue taking depakote 500mg  tid, dilantin 100 tid. MRI brain showed mild atrophy, no acute lesions. EEG, had mild background slowing, no epilepsy discharges. he is still on Depakote 500 mg 3 times a day, Dilantin 100 mg 3 times a day, no recurrent seizure, He was admitted to the hospital September 81 2015, for worsening confusion, dysarthria, was diagnosed with UTI, repeat MRI of the brain showed no acute lesions, MRA showed intracranial atherosclerotic disease, he was started on baby aspirin daily, He was discharged home after rehabilitation,overall doing very well,  Depakote level in hospital September 2015, 54.8, Dilantin was 10  UPDATE Sep 03 2017: He has fell few time, Oct 19, Aug 29 2017, worsening unsteady gait, no recurrent seizure, he does not want to change his antiepileptic medications, he is taking dilantin 100mg  tid, Depakote DR 500mg  tid.   Wife also concerned about his ability of driving.  REVIEW OF SYSTEMS: Full 14 system review of systems performed and notable only for those listed, all others are neg:     ALLERGIES: No Known Allergies  HOME MEDICATIONS: Outpatient Medications Prior to Visit  Medication Sig Dispense Refill  . acetaminophen (TYLENOL) 325 MG tablet Take 2 tablets (650 mg total) by mouth every 6 (six) hours as needed for mild pain, moderate pain, fever or headache (or Fever >/= 101).    Marland Kitchen aspirin EC 81 MG EC tablet Take 1 tablet (81 mg total) by mouth daily.    . divalproex (DEPAKOTE) 500 MG DR tablet TAKE 1 TABLET 3 TIMES A DAY. 90 tablet 11  . finasteride (PROSCAR) 5 MG tablet Take 5 mg by mouth every morning.   2  .  furosemide (LASIX) 40 MG tablet Take 40 mg by mouth daily.   10  . metoprolol succinate (TOPROL-XL) 25 MG 24 hr tablet Take 25 mg by mouth every morning.     . phenytoin (DILANTIN) 100 MG ER capsule TAKE (1) CAPSULE THREE TIMES DAILY. 90 capsule 11    . potassium chloride (KLOR-CON) 8 MEQ tablet Take 8 mEq by mouth daily. Monday, Wednesday, and Friday.  5   No facility-administered medications prior to visit.     PAST MEDICAL HISTORY: Past Medical History:  Diagnosis Date  . Allergic rhinitis   . BPH (benign prostatic hypertrophy)    seen urology, Dr. Earlene PlaterAVIS  . Bronchitis    episode of 2/10  . Cerebral hemorrhage (HCC)    approx 50 years ago  . Dyslipidemia    diet control  . Edema leg    hx of  . Elevated PSA    dr. Earlene Platerdavis PSA 3-6; no PSA-urology f/u for exam yrly  . Foley catheter in place   . Insomnia   . OA (osteoarthritis) of knee    left   . Palpitations    w/u with Card-Dr. Anne FuSkains  . Paroxysmal supraventricular tachycardia (HCC)   . Pneumonia    hx of   . Seizure [facility](HCC)    since childhood, Dr. Debarah CrapeYen; off phenoparb since 2011, after many yr, doing ok  . Small vessel disease   . SOB (shortness of breath)    walking distances/climbing stairs  . Stroke [facility](HCC)    hx of CVA  . SVT (supraventricular tachycardia) (HCC)   . Urinary tract infection    hx of     PAST SURGICAL HISTORY: Past Surgical History:  Procedure Laterality Date  . CATARACT EXTRACTION     2011-12/left   . GREEN LIGHT LASER TURP (TRANSURETHRAL RESECTION OF PROSTATE N/A 04/05/2015   Procedure: GREEN LIGHT LASER TURP (TRANSURETHRAL RESECTION OF PROSTATE;  Surgeon: Jerilee FieldMatthew Eskridge, MD;  Location: [facility];  Service: Urology;  Laterality: N/A;  . HEMORRHOID SURGERY    . US ECHOCARDIOGRAPHY     ok; stress test ok-05-2013 nl LVF, tr AR  . VASECTOMY      FAMILY HISTORY: Family History  Problem Relation Age of Onset  . Heart disease Mother   . Coronary artery disease Mother   . Coronary artery disease Father   . Heart disease Father   . Heart failure Unknown     SOCIAL HISTORY: Social History   Socioeconomic History  . Marital status: Married    Spouse name: Hope  . Number of children: 0  . Years of education: College  . Highest education  level: Not on file  Social Needs  . Financial resource strain: Not on file  . Food insecurity - worry: Not on file  . Food insecurity - inability: Not on file  . Transportation needs - medical: Not on file  . Transportation needs - non-medical: Not on file  Occupational History  . Not on file  Tobacco Use  . Smoking status: Former Smoker    Packs/day: 2.00    Years: 20.00    Pack years: 40.00    Types: Cigarettes    Last attempt to quit: 10/15/1974    Years since quitting: 42.9  . Smokeless tobacco: Never Used  Substance and Sexual Activity  . Alcohol use: Yes    Alcohol/week: 3.0 oz    Types: 5 Glasses of wine per week    Comment: occas   . Drug use: No  .  Sexual activity: Not on file  Other Topics Concern  . Not on file  Social History Narrative   Patient lives at home with wife SatillaHope.    Patient has no children.    Patient is college graduated.    Patient is retired.    Patient is right handed.         PHYSICAL EXAM  Vitals:   09/03/17 1304  BP: (!) 155/78  Pulse: 77  Weight: 201 lb (91.2 kg)  Height: 5\' 8"  (1.727 m)   Body mass index is 30.56 kg/m. Generalized: Well developed, in no acute distress   Neurological examination  MMSE - Mini Mental State Exam 09/04/2017  Orientation to time 4  Orientation to Place 5  Registration 3  Attention/ Calculation 5  Recall 1  Language- name 2 objects 2  Language- repeat 1  Language- follow 3 step command 3  Language- read & follow direction 1  Write a sentence 1  Copy design 0  Total score 26    Cranial nerve II-XII: Pupils were equal round reactive to light extraocular movements were full, visual field were full on confrontational test. Facial sensation and strength were normal. hearing was intact to finger rubbing bilaterally. Uvula tongue midline. head turning and shoulder shrug and were normal and symmetric.Tongue protrusion into cheek strength was normal. Motor: normal bulk and tone, full strength in the  BUE, BLE, fine finger movements normal, no pronator drift. No focal weakness Coordination: finger-nose-finger, heel-to-shin bilaterally, no dysmetria Reflexes: Diminished and symmetric upper and lower Gait and Station: Rising up from seated position by pushing on chair arm,, wide based cautious gait no difficulty with turning, ambulates with single-point cane . Tandem gait not attempted   DIAGNOSTIC DATA (LABS, IMAGING, TESTING) - I reviewed patient records, labs, notes, testing and imaging myself where available.  Lab Results  Component Value Date   WBC 8.5 03/30/2015   HGB 12.5 (L) 03/30/2015   HCT 38.3 (L) 03/30/2015   MCV 96.0 03/30/2015   PLT 139 (L) 03/30/2015      Component Value Date/Time   NA 143 03/30/2015 1540   NA 142 08/26/2013 0905   K 4.4 03/30/2015 1540   CL 98 (L) 03/30/2015 1540   CO2 25 03/30/2015 1540   GLUCOSE 95 03/30/2015 1540   BUN 35 (H) 03/30/2015 1540   BUN 21 08/26/2013 0905   CREATININE 0.93 03/30/2015 1540   CALCIUM 9.8 03/30/2015 1540   PROT 6.0 06/25/2014 1901   PROT 6.2 08/26/2013 0905   ALBUMIN 2.8 (L) 06/25/2014 1901   ALBUMIN 3.9 08/26/2013 0905   AST 23 06/25/2014 1901   ALT 17 06/25/2014 1901   ALKPHOS 61 06/25/2014 1901   BILITOT 0.3 06/25/2014 1901   GFRNONAA >60 03/30/2015 1540   GFRAA >60 03/30/2015 1540    ASSESSMENT AND PLAN  83 y.o. year old male    Epilepsy:  Refill his dilantin, Depakote Dr. Mild cognitive Impairment  Lab evaluation  Add on Namenda 10mg  bid and Aricept 10mg  daily Gait abnormalities.  Refer to home physical therapy   Levert FeinsteinYijun Chalsea Darko, M.D. Ph.D.  [facility] [address] [place], [place] [address] Phone: [phone] Fax:      [phone]

## 2017-09-04 ENCOUNTER — Telehealth: Payer: Self-pay | Admitting: *Deleted

## 2017-09-04 LAB — CBC
Hematocrit: 39.9 % (ref 37.5–51.0)
Hemoglobin: 13 g/dL (ref 13.0–17.7)
MCH: 31.6 pg (ref 26.6–33.0)
MCHC: 32.6 g/dL (ref 31.5–35.7)
MCV: 97 fL (ref 79–97)
Platelets: 164 10*3/uL (ref 150–379)
RBC: 4.11 x10E6/uL — AB (ref 4.14–5.80)
RDW: 14.9 % (ref 12.3–15.4)
WBC: 9.9 10*3/uL (ref 3.4–10.8)

## 2017-09-04 LAB — VITAMIN B12: VITAMIN B 12: 709 pg/mL (ref 232–1245)

## 2017-09-04 LAB — COMPREHENSIVE METABOLIC PANEL
ALT: 12 IU/L (ref 0–44)
AST: 18 IU/L (ref 0–40)
Albumin/Globulin Ratio: 1.6 (ref 1.2–2.2)
Albumin: 4.1 g/dL (ref 3.5–4.7)
Alkaline Phosphatase: 78 IU/L (ref 39–117)
BUN/Creatinine Ratio: 30 — ABNORMAL HIGH (ref 10–24)
BUN: 31 mg/dL — ABNORMAL HIGH (ref 8–27)
Bilirubin Total: 0.4 mg/dL (ref 0.0–1.2)
CO2: 23 mmol/L (ref 20–29)
CREATININE: 1.04 mg/dL (ref 0.76–1.27)
Calcium: 9 mg/dL (ref 8.6–10.2)
Chloride: 98 mmol/L (ref 96–106)
GFR calc Af Amer: 75 mL/min/{1.73_m2} (ref >59)
GFR calc non Af Amer: 65 mL/min/{1.73_m2} (ref >59)
GLOBULIN, TOTAL: 2.5 g/dL (ref 1.5–4.5)
Glucose: 82 mg/dL (ref 65–99)
POTASSIUM: 4.2 mmol/L (ref 3.5–5.2)
Sodium: 139 mmol/L (ref 134–144)
Total Protein: 6.6 g/dL (ref 6.0–8.5)

## 2017-09-04 LAB — TSH: TSH: 3.15 u[IU]/mL (ref 0.450–4.500)

## 2017-09-04 NOTE — Telephone Encounter (Signed)
Patient and wife aware of results by phone.

## 2017-09-04 NOTE — Telephone Encounter (Signed)
-----   Message from Levert FeinsteinYijun Yan, MD sent at 09/04/2017 10:04 AM EST ----- Please call patient for normal laboratory result

## 2017-09-09 DIAGNOSIS — G40309 Generalized idiopathic epilepsy and epileptic syndromes, not intractable, without status epilepticus: Secondary | ICD-10-CM | POA: Diagnosis not present

## 2017-09-09 DIAGNOSIS — I10 Essential (primary) hypertension: Secondary | ICD-10-CM | POA: Diagnosis not present

## 2017-09-09 DIAGNOSIS — I739 Peripheral vascular disease, unspecified: Secondary | ICD-10-CM | POA: Diagnosis not present

## 2017-09-09 DIAGNOSIS — I672 Cerebral atherosclerosis: Secondary | ICD-10-CM | POA: Diagnosis not present

## 2017-09-09 DIAGNOSIS — R2689 Other abnormalities of gait and mobility: Secondary | ICD-10-CM | POA: Diagnosis not present

## 2017-09-10 ENCOUNTER — Telehealth: Payer: Self-pay | Admitting: Neurology

## 2017-09-10 NOTE — Telephone Encounter (Signed)
Physical Therapist Sree [facility] is looking for verbal orders for pt. He needs 2 times a week for 3 weeks and 1 time a week for 4 weeks.  Sree can be reached at [phone]

## 2017-09-10 NOTE — Telephone Encounter (Signed)
Returned call and provided verbal orders for PT.

## 2017-09-18 DIAGNOSIS — D696 Thrombocytopenia, unspecified: Secondary | ICD-10-CM | POA: Diagnosis not present

## 2017-09-18 DIAGNOSIS — R569 Unspecified convulsions: Secondary | ICD-10-CM | POA: Diagnosis not present

## 2017-09-18 DIAGNOSIS — I471 Supraventricular tachycardia: Secondary | ICD-10-CM | POA: Diagnosis not present

## 2017-09-18 DIAGNOSIS — R413 Other amnesia: Secondary | ICD-10-CM | POA: Diagnosis not present

## 2017-10-23 ENCOUNTER — Telehealth: Payer: Self-pay | Admitting: Neurology

## 2017-10-23 NOTE — Telephone Encounter (Signed)
[facility]ree/[facility] [phone] request to extended PT 2 wks one x wk, then recert for 5 weeks. Please call to advise

## 2017-10-23 NOTE — Telephone Encounter (Signed)
Returned [facility] [place] [facility] who was provided with verbal to extend PT.  Provided our fax number to send orders, if MD signature is required.

## 2017-11-04 ENCOUNTER — Telehealth: Payer: Self-pay | Admitting: Neurology

## 2017-11-04 NOTE — Telephone Encounter (Signed)
FYI [facility] has called re: verbal orders for 1 time a week for 6 weeks starting on 11-08-2017 for community ambulation and balancing training.  Sree can be reached at [phone]

## 2017-11-04 NOTE — Telephone Encounter (Signed)
Returned call - [facility]

## 2017-11-08 DIAGNOSIS — I10 Essential (primary) hypertension: Secondary | ICD-10-CM | POA: Diagnosis not present

## 2017-11-08 DIAGNOSIS — I739 Peripheral vascular disease, unspecified: Secondary | ICD-10-CM | POA: Diagnosis not present

## 2017-11-08 DIAGNOSIS — I672 Cerebral atherosclerosis: Secondary | ICD-10-CM | POA: Diagnosis not present

## 2017-11-08 DIAGNOSIS — M1712 Unilateral primary osteoarthritis, left knee: Secondary | ICD-10-CM | POA: Diagnosis not present

## 2017-11-08 DIAGNOSIS — G40309 Generalized idiopathic epilepsy and epileptic syndromes, not intractable, without status epilepticus: Secondary | ICD-10-CM | POA: Diagnosis not present

## 2018-01-08 ENCOUNTER — Emergency Department (HOSPITAL_COMMUNITY)
Admission: EM | Admit: 2018-01-08 | Discharge: 2018-01-08 | Disposition: A | Discharge status: Home / Self Care | Payer: Medicare Other | Attending: Emergency Medicine | Admitting: Emergency Medicine

## 2018-01-08 ENCOUNTER — Encounter (HOSPITAL_COMMUNITY): Payer: Self-pay | Admitting: *Deleted

## 2018-01-08 ENCOUNTER — Emergency Department (HOSPITAL_COMMUNITY): Payer: Medicare Other

## 2018-01-08 ENCOUNTER — Other Ambulatory Visit: Payer: Self-pay

## 2018-01-08 DIAGNOSIS — Z87891 Personal history of nicotine dependence: Secondary | ICD-10-CM | POA: Diagnosis not present

## 2018-01-08 DIAGNOSIS — Z79899 Other long term (current) drug therapy: Secondary | ICD-10-CM | POA: Diagnosis not present

## 2018-01-08 DIAGNOSIS — M542 Cervicalgia: Secondary | ICD-10-CM | POA: Diagnosis not present

## 2018-01-08 DIAGNOSIS — R569 Unspecified convulsions: Secondary | ICD-10-CM

## 2018-01-08 DIAGNOSIS — G40909 Epilepsy, unspecified, not intractable, without status epilepticus: Secondary | ICD-10-CM | POA: Diagnosis not present

## 2018-01-08 HISTORY — DX: Unspecified convulsions: R56.9

## 2018-01-08 LAB — BASIC METABOLIC PANEL
Anion gap: 14 (ref 5–15)
BUN: 25 mg/dL — AB (ref 6–20)
CHLORIDE: 104 mmol/L (ref 101–111)
CO2: 21 mmol/L — AB (ref 22–32)
CREATININE: 0.88 mg/dL (ref 0.61–1.24)
Calcium: 8.9 mg/dL (ref 8.9–10.3)
GFR calc Af Amer: >60 mL/min (ref >60)
GFR calc non Af Amer: >60 mL/min (ref >60)
GLUCOSE: 114 mg/dL — AB (ref 65–99)
Potassium: 3.7 mmol/L (ref 3.5–5.1)
Sodium: 139 mmol/L (ref 135–145)

## 2018-01-08 LAB — VALPROIC ACID LEVEL: Valproic Acid Lvl: 32 ug/mL — ABNORMAL LOW (ref 50.0–100.0)

## 2018-01-08 LAB — CBC
HEMATOCRIT: 42.4 % (ref 39.0–52.0)
Hemoglobin: 13.8 g/dL (ref 13.0–17.0)
MCH: 31.9 pg (ref 26.0–34.0)
MCHC: 32.5 g/dL (ref 30.0–36.0)
MCV: 98.1 fL (ref 78.0–100.0)
Platelets: 95 10*3/uL — ABNORMAL LOW (ref 150–400)
RBC: 4.32 MIL/uL (ref 4.22–5.81)
RDW: 13.8 % (ref 11.5–15.5)
WBC: 6.8 10*3/uL (ref 4.0–10.5)

## 2018-01-08 LAB — CBG MONITORING, ED: Glucose-Capillary: 106 mg/dL — ABNORMAL HIGH (ref 65–99)

## 2018-01-08 LAB — PHENYTOIN LEVEL, TOTAL: Phenytoin Lvl: 5 ug/mL — ABNORMAL LOW (ref 10.0–20.0)

## 2018-01-08 MED ORDER — DIVALPROEX SODIUM 500 MG PO DR TAB
500.0000 mg | DELAYED_RELEASE_TABLET | Freq: Once | ORAL | Status: AC
Start: 2018-01-08 — End: 2018-01-08
  Administered 2018-01-08: 500 mg via ORAL
  Filled 2018-01-08: qty 1

## 2018-01-08 MED ORDER — METOPROLOL SUCCINATE ER 25 MG PO TB24: 25.0000 mg | ORAL_TABLET | Freq: Every morning | ORAL | Status: DC

## 2018-01-08 MED ORDER — PHENYTOIN SODIUM EXTENDED 100 MG PO CAPS
100.0000 mg | ORAL_CAPSULE | Freq: Three times a day (TID) | ORAL | Status: DC
  Administered 2018-01-08: 100 mg via ORAL
  Filled 2018-01-08: qty 1

## 2018-01-08 MED ORDER — PHENYTOIN SODIUM EXTENDED 100 MG PO CAPS: 100.0000 mg | ORAL_CAPSULE | Freq: Three times a day (TID) | ORAL | Status: DC

## 2018-01-08 MED ORDER — DIVALPROEX SODIUM 500 MG PO DR TAB: 500.0000 mg | DELAYED_RELEASE_TABLET | Freq: Three times a day (TID) | ORAL | Status: DC

## 2018-01-08 MED ORDER — PHENYTOIN SODIUM EXTENDED 100 MG PO CAPS
100.0000 mg | ORAL_CAPSULE | Freq: Once | ORAL | Status: AC
  Administered 2018-01-08: 100 mg via ORAL
  Filled 2018-01-08: qty 1

## 2018-01-08 MED ORDER — FUROSEMIDE 40 MG PO TABS
40.0000 mg | ORAL_TABLET | Freq: Every day | ORAL | Status: DC
  Administered 2018-01-08: 40 mg via ORAL
  Filled 2018-01-08: qty 1

## 2018-01-08 NOTE — Progress Notes (Addendum)
UPDATE 3:40PM: CSW spoke with patients [facility] regarding plans for patient. Sister-in-law is currently looking for 24-hour care from [facility] in order for patient to return home.   CSW aware of consult and attempting to assist.   Per notes and EDP, patient presents to ED after having seizure at home. Patient has history of dementia and spouse is primary care giver/usually administers any needed medications. Patients [facility] and has not been able to administer patients his needed medications for seizures.   CSW is following up with RN CM to determine [facility] with medication assistance while spouse is in the hospital.   Stacy GardnerErin Augusta Hilbert, [facility] Emergency Room Clinical Social Worker [phone]

## 2018-01-08 NOTE — ED Notes (Signed)
Pt reports he "wants to go home" Rn talked to pt about his condition and pt is still having some confusion. RN told pt that they are concerned regarding his ability to live safely at home.

## 2018-01-08 NOTE — ED Provider Notes (Signed)
[facility]ach[facility] Provider Note   CSN: [id number] Arrival date & time: 01/08/18  1115     History   Chief Complaint Chief Complaint  Patient presents with  . Seizures    HPI Zachary Ali is a 84 y.o. male.  HPI   Patient is an 84 year old male presenting today after seizure.  Patient is on multiple seizure medications, including Dilantin and Depakote.  Patient's seizure medications are usually administered by his wife.  However she was hospitalized at [facility] currently.  Apparently he has home health services with the only come once a day.  It is unclear whether patient's been taking any of his medications correctly.  Patient is alert and oriented but unfortunately oriented only toward self.  When asked why he was here he said "will someone told me to have a meeting here".  When asked where he lives he stated "near [employer]".  EMS found patient at home status post seizure. Past Medical History:  Diagnosis Date  . Allergic rhinitis   . BPH (benign prostatic hypertrophy)    seen urology, Dr. Earlene Plater  . Bronchitis    episode of 2/10  . Cerebral hemorrhage (HCC)    approx 50 years ago  . Dyslipidemia    diet control  . Edema leg    hx of  . Elevated PSA    dr. Earlene Plater PSA 3-6; no PSA-urology f/u for exam yrly  . Foley catheter in place   . Insomnia   . OA (osteoarthritis) of knee    left   . Palpitations    w/u with Card-Dr. Anne Fu  . Paroxysmal supraventricular tachycardia (HCC)   . Pneumonia    hx of   . Seizure [facility])    since childhood, Dr. Debarah Crape; off phenoparb since 2011, after many yr, doing ok  . Small vessel disease   . SOB (shortness of breath)    walking distances/climbing stairs  . Stroke [facility])    hx of CVA  . SVT (supraventricular tachycardia) (HCC)   . Urinary tract infection    hx of     Patient Active Problem List   Diagnosis Date Noted  . Mild cognitive impairment 09/03/2017  . Gait abnormality  09/03/2017  . Seizure disorder (HCC) 08/28/2016  . UTI (urinary tract infection) 12/08/2014  . Acute renal failure (HCC) 12/08/2014  . Urinary retention 12/08/2014  . Thrombocytopenia (HCC) 12/08/2014  . Anemia 12/08/2014  . Lung nodule 12/08/2014  . Fall at home 12/08/2014  . Diarrhea 12/08/2014  . UTI (lower urinary tract infection) 12/08/2014  . Altered mental state 06/25/2014  . CVA (cerebral infarction) 06/25/2014  . 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 CATARACT EXTRACTION     2011-12/left   . GREEN LIGHT LASER TURP (TRANSURETHRAL RESECTION OF PROSTATE N/A 04/05/2015   Procedure: GREEN LIGHT LASER TURP (TRANSURETHRAL RESECTION OF PROSTATE;  Surgeon: Jerilee Field, MD;  Location: [facility];  Service: Urology;  Laterality: N/A;  . HEMORRHOID SURGERY    . US ECHOCARDIOGRAPHY     ok; stress test ok-05-2013 nl LVF, tr AR  . VASECTOMY          Home Medications    Prior to Admission medications   Medication Sig Start Date End Date Taking? Authorizing Provider  divalproex (DEPAKOTE) 500 MG DR tablet TAKE 1 TABLET 3 TIMES A DAY. Patient taking differently: Take 500 mg by mouth 3 (three) times daily.  09/03/17  Yes Levert Feinstein, MD  donepezil (ARICEPT) 10 MG tablet Take 1 tablet (10 mg total) by mouth at bedtime. 09/03/17  Yes Levert Feinstein, MD  finasteride (PROSCAR) 5 MG tablet Take 5 mg by mouth every morning.  08/09/14  Yes [provider]  furosemide (LASIX) 40 MG tablet Take 40 mg by mouth daily.  08/15/15  Yes [provider]  memantine (NAMENDA) 10 MG tablet Take 1 tablet (10 mg total) by mouth 2 (two) times daily. 09/03/17  Yes Levert Feinstein, MD  metoprolol succinate (TOPROL-XL) 25 MG 24 hr tablet Take 25 mg by mouth every morning.    Yes [provider]  phenytoin (DILANTIN) 100 MG ER capsule TAKE (1) CAPSULE THREE TIMES DAILY. Patient taking differently: Take 100 mg by mouth 3  (three) times daily.  09/03/17  Yes Levert Feinstein, MD  potassium chloride (KLOR-CON) 8 MEQ tablet Take 8 mEq by mouth daily.  11/22/14  Yes [provider]  acetaminophen (TYLENOL) 325 MG tablet Take 2 tablets (650 mg total) by mouth every 6 (six) hours as needed for mild pain, moderate pain, fever or headache (or Fever >/= 101). 12/11/14   Hongalgi, Maximino Greenland, MD  aspirin EC 81 MG EC tablet Take 1 tablet (81 mg total) by mouth daily. 06/27/14   Edsel Petrin, DO    Family History Family History  Problem Relation Age of Onset  . Heart disease Mother   . Coronary artery disease Mother   . Coronary artery disease Father   . Heart disease Father   . Heart failure Unknown     Social History Social History   Tobacco Use  . Smoking status: Former Smoker    Packs/day: 2.00    Years: 20.00    Pack years: 40.00    Types: Cigarettes    Last attempt to quit: 10/15/1974    Years since quitting: 43.2  . Smokeless tobacco: Never Used  Substance Use Topics  . Alcohol use: Yes    Alcohol/week: 3.0 oz    Types: 5 Glasses of wine per week    Comment: occas   . Drug use: No     Allergies   Patient has no known allergies.   Review of Systems Review of Systems  Constitutional: Negative for activity change.  Respiratory: Negative for shortness of breath.   Cardiovascular: Negative for chest pain.  Gastrointestinal: Negative for abdominal pain.  Neurological: Positive for seizures.  All other systems reviewed and are negative.    Physical Exam Updated Vital Signs BP (!) 153/70   Pulse 70   Temp (!) 97.5 F (36.4 C) (Oral)   Resp 19   Wt 90.7 kg (200 lb)   SpO2 95%   BMI 30.41 kg/m   Physical Exam  Constitutional: He appears well-nourished.  HENT:  Head: Normocephalic.  Eyes: Conjunctivae are normal. Right eye exhibits no discharge. Left eye exhibits no discharge.  Cardiovascular: Normal rate and regular rhythm.  Pulmonary/Chest: Effort normal and breath sounds normal.  No respiratory distress.  Abdominal: He exhibits no distension. There is no tenderness.  Neurological:  Oriented to self.  Skin: Skin is warm and dry. He is not diaphoretic.  Psychiatric: He has a normal mood and affect. His behavior is normal.     ED Treatments / Results  Labs (all labs ordered are listed, but only abnormal results are displayed) Labs Reviewed  BASIC METABOLIC PANEL - Abnormal; Notable for the following components:      Result Value  CO2 21 (*)    Glucose, Bld 114 (*)    BUN 25 (*)    All other components within normal limits  VALPROIC ACID LEVEL - Abnormal; Notable for the following components:   Valproic Acid Lvl 32 (*)    All other components within normal limits  CBC - Abnormal; Notable for the following components:   Platelets 95 (*)    All other components within normal limits  PHENYTOIN LEVEL, TOTAL - Abnormal; Notable for the following components:   Phenytoin Lvl 5.0 (*)    All other components within normal limits  CBG MONITORING, ED - Abnormal; Notable for the following components:   Glucose-Capillary 106 (*)    All other components within normal limits    EKG None  Radiology Ct Head Wo Contrast  Result Date: 01/08/2018 CLINICAL DATA:  Seizure.  Neck pain. EXAM: CT HEAD WITHOUT CONTRAST CT CERVICAL SPINE WITHOUT CONTRAST TECHNIQUE: Multidetector CT imaging of the head and cervical spine was performed following the standard protocol without intravenous contrast. Multiplanar CT image reconstructions of the cervical spine were also generated. COMPARISON:  CT scan of June 25, 2014. FINDINGS: CT HEAD FINDINGS Brain: Mild diffuse cortical atrophy is noted. Mild chronic ischemic white matter disease is noted. No mass effect or midline shift is noted. Ventricular size is within normal limits. There is no evidence of mass lesion, hemorrhage or acute infarction. Vascular: No hyperdense vessel or unexpected calcification. Skull: Normal. Negative for  fracture or focal lesion. Sinuses/Orbits: No acute finding. Other: None. CT CERVICAL SPINE FINDINGS Alignment: Normal. Skull base and vertebrae: No acute fracture. No primary bone lesion or focal pathologic process. Soft tissues and spinal canal: No abnormality seen in the spinal canal. 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 Disc levels: Severe degenerative disc disease is noted at C3-4. Mild degenerative disc disease is noted at C5-6 and C6-7. Upper chest: Negative. Other: Degenerative changes are seen involving posterior facet joints bilaterally. IMPRESSION: Mild diffuse cortical atrophy. Mild chronic ischemic white matter disease. No acute intracranial abnormality seen. Multilevel degenerative disc disease. No acute abnormality seen in the cervical spine. Electronically Signed   By: Lupita Raider, M.D.   On: 01/08/2018 13:21    Procedures Procedures (including critical care time)  Medications Ordered in ED Medications  furosemide (LASIX) tablet 40 mg (has no administration in time range)  metoprolol succinate (TOPROL-XL) 24 hr tablet 25 mg (has no administration in time range)  phenytoin (DILANTIN) ER capsule 100 mg (has no administration in time range)  divalproex (DEPAKOTE) DR tablet 500 mg (has no administration in time range)  divalproex (DEPAKOTE) DR tablet 500 mg (500 mg Oral Given 01/08/18 1358)  phenytoin (DILANTIN) ER capsule 100 mg (100 mg Oral Given 01/08/18 1358)     Initial Impression / Assessment and Plan / ED Course  I have reviewed the triage vital signs and the nursing notes.  Pertinent labs & imaging results that were available during my care of the patient were reviewed by me and considered in my medical decision making (see chart for details).     Patient is an 84 year old male presenting today after seizure.  Patient is on multiple seizure medications, including Dilantin and Depakote.  Patient's seizure medications are  usually administered by his wife.  However she was hospitalized at [facility] currently.  Apparently he has home health services with the only come once a day.  It is unclear whether patient's been taking any  of his medications correctly.  Patient is alert and oriented but unfortunately oriented only toward self.  When asked why he was here he said "will someone told me to have a meeting here".  When asked where he lives he [place] [employer]  EMS found patient at home status post seizure.  3:29 PM Patient's been given his home medications.  Both caseworker and social worker involved.  Patient's wife is upstairs however she meets criteria for significant placement.  So we will not be returning home.  By my opinion, patient is incredibly unsafe for discharge home alone.  He is not even able to orient himself to his current location.  They are both working on a safe dispo to patient.  Patient's home seizure medications have been ordered.   Final Clinical Impressions(s) / ED Diagnoses   Final diagnoses:  None    ED Discharge Orders    None       Ottilia Pippenger, Cindee Saltourteney Lyn, MD 01/08/18 1531

## 2018-01-08 NOTE — ED Notes (Signed)
Pt discharged with wife and advanced home care

## 2018-01-08 NOTE — ED Notes (Signed)
Pt confused and saying he wants to "release the water" Rn asked if the pt needed to use the restroom. Pt replied "Yes." Rn assisted pt to the bathroom. Pt has a very unsteady gait and is very confused. Pt missed the toilet. RN cleaned up the bathroom.

## 2018-01-08 NOTE — Discharge Instructions (Addendum)
We think a seizure today is likely due to low levels of your usual medications in your blood.  Please take medications as prescribed.  We are hoping that social work and case management has helped arrange useful helpers in your home.

## 2018-01-08 NOTE — Care Management Note (Addendum)
Case Management Note  Patient Details  Name: Zachary SowJohn D Ali MRN: [id number] Date of Birth: 1928/11/05  CM was advised that pt's [facility] was being [facility]  [facility]  Clydie [facility]Karen with [facility] aware of orders and need for either increased PCS to 24/7 with [facility] or SNF placement for both.  Once pt's wife leave from upstairs, she will pick pt up on the way out.  Updated Dr. Corlis LeakMacKuen and primary RN, Gerilyn PilgrimJacob.  No further CM needs noted at this time.  Expected Discharge Date:    01/08/2018              Expected Discharge Plan:  Home w Home Health Services  In-House Referral:  Clinical Social Work  Discharge planning Services  CM Consult  Post Acute Care Choice:  Home Health Choice offered to:  Spouse  HH Arranged:  RN, PT, OT, Nurse's Aide, Social Work [facility] Agency:  [facility]  Status of Service:  Completed, signed off  Zachary Ali, Lynnae Sandhoffngela N, RN 01/08/2018, 4:55 PM

## 2018-01-08 NOTE — ED Notes (Signed)
Ambulated with patient to the restroom. He needed 1 person assist and would have fallen alone.

## 2018-01-08 NOTE — ED Triage Notes (Signed)
EMS states pt has been without his seizure meds for 1.5 days. Wife usually gives meds but is in the hospital at present time. Seized in chair and assisted to floor. No noted injuries. IV @20  Left hand, CBG 111 160/90-70

## 2018-01-08 NOTE — ED Notes (Signed)
Bed: WA18 Expected date:  Expected time:  Means of arrival:  Comments: seizure 

## 2018-01-08 NOTE — Progress Notes (Signed)
CSW met with pt's wife who is D/C'ing from 1401 at [facility] and pt's wife's sister and confirmed they were aware the pt (WA18 [facility] ED)  is also D/C'ing and that the pt's husband is in the ED (room [phone])  also ready for D/C.  CSW confirmed with pt's wife and pt's wife's sister whose son is riding around the city waiting to p/u the pt, the pt's wife's sister and the pt's wife.  CSW facilitated with pt's RN and the pt's wife's RN that all were going to meet at the ED waiting room once the pt's wife arrives to the Ed waiting room to be p/u by a relative.  Pt's RN stated he would contact the pt's wife's RN in the [facility] (room 1401) and make the [facility] RN aware that the ED RN waiting to take the pt to the ED waiting room entrance so the RN in the ED [phone]) could meet the pt from [facility] 1401 in the ED Waiting Room to be p/u by family.  RN on [facility] updated (at ph: [phone]) and YB01 [facility] ED RN updated.  Please reconsult if future social work needs arise.  CSW signing off, as social work intervention is no longer needed.

## 2018-01-28 ENCOUNTER — Telehealth: Payer: Self-pay

## 2018-01-28 NOTE — Telephone Encounter (Signed)
We received a prior authorization request for this medication. I have completed and submitted the PA on Cover My Meds and should have a determination within 48-72 hours.  Cover My Meds Key: [id number]

## 2018-01-29 ENCOUNTER — Other Ambulatory Visit: Payer: Self-pay | Admitting: Neurology

## 2018-01-29 NOTE — Telephone Encounter (Signed)
APPROVED.  Effective from 01/28/2018 through 01/29/2019.

## 2018-02-18 ENCOUNTER — Telehealth: Payer: Self-pay | Admitting: Neurology

## 2018-02-18 ENCOUNTER — Encounter: Payer: Self-pay | Admitting: Neurology

## 2018-02-18 ENCOUNTER — Other Ambulatory Visit: Payer: Self-pay | Admitting: Neurology

## 2018-02-18 MED ORDER — DILANTIN 100 MG PO CAPS: ORAL_CAPSULE | ORAL | 5 refills | Status: DC

## 2018-02-18 NOTE — Telephone Encounter (Signed)
Rx due and sent to the pharmacy.  Returned call to Irving Burton to let her know this update.

## 2018-02-18 NOTE — Telephone Encounter (Signed)
Emily/[facility] [phone] reg denial for dilantin today. She knows it is 10 days early, last refill was 30 day supply, she is trying to start him on a blister pack but she does not have enough to do that. She said he has a Surveyor, mining, they are having difficulty with him at home. She said home health care RN brought the bottle to her with 5 days left. Please call to advise.

## 2018-02-25 DIAGNOSIS — G40909 Epilepsy, unspecified, not intractable, without status epilepticus: Secondary | ICD-10-CM | POA: Diagnosis not present

## 2018-02-25 DIAGNOSIS — R627 Adult failure to thrive: Secondary | ICD-10-CM | POA: Diagnosis not present

## 2018-02-25 DIAGNOSIS — R410 Disorientation, unspecified: Secondary | ICD-10-CM | POA: Diagnosis not present

## 2018-02-26 DIAGNOSIS — N400 Enlarged prostate without lower urinary tract symptoms: Secondary | ICD-10-CM | POA: Diagnosis not present

## 2018-03-03 NOTE — Progress Notes (Signed)
[facility]  PATIENT: Zachary Ali DOB: 1928/11/05   REASON FOR VISIT: Follow-up for seizure disorder with recent seizure in March due to missing doses of medication HISTORY FROM: Patient and wife and caregiver    HISTORY OF PRESENT ILLNESS:  09/03/17 [name] 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  No seizures in several years. Denies any falls, using a single-point cane. Patient remains on Dilantin and Depakote. Discussed again stopping Dilantin and adding Keppra but pt is reluctant.  No new neurologic complaints.       HISTORY: initial evaluation by Dr Terrace Arabia 08/17/10. He is referred to this clinic, because there was no longer recurrent seizure, and with this worsening gait difficulty, wondering if we can make some adjustment of his epileptiic medications  PMHx.He has past medical history of tachycardia is taking beta blocker, also has a history of seizure, has been on long-term epileptic medications. He began to experience seizures since age 76, initially it was frequent Petit mal seizure, up to 12-16 episodes in a day, he has been treated with combination of Dilantin, and phenobarbital since age 54, without effectively control his petit mal seizure. In addition he also has some infrequent generalized tonic-clonic seizure. In 1976, he was started on Depakote, which has drastically change his seizure, his petit mal seizure was under much better control, while he was on Depakote and phenobarbital combination, he developed a generalized tonic-clonic seizure, Dilantin was reintroduced later, he has been on current dose of 3 medication for 35 years. Dilantin100 mg t.i.d. phenobarbital 100 mg every morning. Depakote500 mg t.i.d.  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 Over the past couple years, he was noticed to have unsteady  gait. he denied incontinence, bilateral lower extremity paresthesia. He has successfully weaned off phenobarbital now, he feels better afterwards, there is no recurrent seizure. He is continue taking depakote  tid, dilantin 100 tid. MRI brain showed mild atrophy, no acute lesions. EEG, had mild background slowing, no epilepsy discharges. he is still on Depakote 500 mg 3 times a day, Dilantin 100 mg 3 times a day, no recurrent seizure, He was admitted to the hospital June 24 2014, for worsening confusion, dysarthria, was diagnosed with UTI, repeat MRI of the brain showed no acute lesions, MRA showed intracranial atherosclerotic disease, he was started on baby aspirin daily, He was discharged home after rehabilitation,overall doing very well,  Depakote level in hospital September 2015, 54.8, Dilantin was 10  UPDATE Sep 03 2017:YY He has fell few time, Oct 19, Aug 29 2017, worsening unsteady gait, no recurrent seizure, he does not want to change his antiepileptic medications, he is taking dilantin  tid, Depakote DR  tid.   Wife also concerned about his ability of driving. UPDATE 5/21/2019CM  Zachary Ali, 84 year old male returns for follow-up with history of seizure disorder.  Last seizure occurred on 01/08/2018 while  his wife was in the hospital, he was home alone and did not take his seizure medication for 2 days.  They now have a caregiver 7 hours a day and home health agency is involved as well.  No further seizure activity since being back on medication.  He denies any falls uses a single-point cane.  Appetite is good and he is sleeping well.  He returns for reevaluation    REVIEW OF SYSTEMS: Full 14 system review of systems performed and notable only for those listed,  all others are neg:  Constitutional: neg  Cardiovascular: neg Ear/Nose/Throat: neg  Skin: neg Eyes: neg Respiratory: neg Gastroitestinal: neg  Hematology/Lymphatic: neg  Endocrine:  neg Musculoskeletal:neg Allergy/Immunology: neg Neurological: Seizure after not taking medications for today, memory loss Psychiatric: neg Sleep : neg   ALLERGIES: No Known Allergies  HOME MEDICATIONS: Outpatient Medications Prior to Visit  Medication Sig Dispense Refill  . acetaminophen (TYLENOL) 325 MG tablet Take 2 tablets (650 mg total) by mouth every 6 (six) hours as needed for mild pain, moderate pain, fever or headache (or Fever >/= 101).    Marland Kitchen aspirin EC 81 MG EC tablet Take 1 tablet (81 mg total) by mouth daily.    Marland Kitchen DILANTIN 100 MG ER capsule TAKE (1) CAPSULE THREE TIMES DAILY. 90 capsule 5  . divalproex (DEPAKOTE) 500 MG DR tablet TAKE 1 TABLET 3 TIMES A DAY. (Patient taking differently: Take 500 mg by mouth 3 (three) times daily. ) 270 tablet 4  . donepezil (ARICEPT) 10 MG tablet Take 1 tablet (10 mg total) by mouth at bedtime. 30 tablet 11  . finasteride (PROSCAR) 5 MG tablet Take 5 mg by mouth every morning.   2  . furosemide (LASIX) 40 MG tablet Take 40 mg by mouth daily.   10  . levofloxacin (LEVAQUIN) 500 MG tablet 500 mg.  0  . memantine (NAMENDA) 10 MG tablet Take 1 tablet (10 mg total) by mouth 2 (two) times daily. 60 tablet 11  . metoprolol succinate (TOPROL-XL) 25 MG 24 hr tablet Take 25 mg by mouth every morning.     . potassium chloride (KLOR-CON) 8 MEQ tablet Take 8 mEq by mouth daily.   5   No facility-administered medications prior to visit.     PAST MEDICAL HISTORY: Past Medical History:  Diagnosis Date  . Allergic rhinitis   . BPH (benign prostatic hypertrophy)    seen urology, Dr. Earlene Plater  . Bronchitis    episode of 2/10  . Cerebral hemorrhage (HCC)    approx 50 years ago  . Dyslipidemia    diet control  . Edema leg    hx of  . Elevated PSA    dr. Earlene Plater PSA 3-6; no PSA-urology f/u for exam yrly  . Foley catheter in place   . Insomnia   . OA (osteoarthritis) of knee    left   . Palpitations    w/u with Card-Dr. Anne Fu  . Paroxysmal  supraventricular tachycardia (HCC)   . Pneumonia    hx of   . Seizure (HCC) 01/08/2018   since childhood, Dr. Debarah Crape; off phenoparb since 2011, after many yr, doing ok  . Small vessel disease (HCC)   . SOB (shortness of breath)    walking distances/climbing stairs  . Stroke [facility])    hx of CVA  . SVT (supraventricular tachycardia) (HCC)   . Urinary tract infection    hx of     PAST SURGICAL HISTORY: Past Surgical History:  Procedure Laterality Date  . CATARACT EXTRACTION     2011-12/left   . GREEN LIGHT LASER TURP (TRANSURETHRAL RESECTION OF PROSTATE N/A 04/05/2015   Procedure: GREEN LIGHT LASER TURP (TRANSURETHRAL RESECTION OF PROSTATE;  Surgeon: Jerilee Field, MD;  Location: [facility];  Service: Urology;  Laterality: N/A;  . HEMORRHOID SURGERY    . US ECHOCARDIOGRAPHY     ok; stress test ok-05-2013 nl LVF, tr AR  . VASECTOMY      FAMILY HISTORY: Family History  Problem Relation Age of Onset  .  Heart disease Mother   . Coronary artery disease Mother   . Coronary artery disease Father   . Heart disease Father   . Heart failure Unknown     SOCIAL HISTORY: Social History   Socioeconomic History  . Marital status: Married    Spouse name: Hope  . Number of children: 0  . Years of education: College  . Highest education level: Not on file  Occupational History  . Not on file  Social Needs  . Financial resource strain: Not on file  . Food insecurity:    Worry: Not on file    Inability: Not on file  . Transportation needs:    Medical: Not on file    Non-medical: Not on file  Tobacco Use  . Smoking status: Former Smoker    Packs/day: 2.00    Years: 20.00    Pack years: 40.00    Types: Cigarettes    Last attempt to quit: 10/15/1974    Years since quitting: 43.4  . Smokeless tobacco: Never Used  Substance and Sexual Activity  . Alcohol use: Yes    Alcohol/week: 3.0 oz    Types: 5 Glasses of wine per week    Comment: occas   . Drug use: No  . Sexual activity: Not  on file  Lifestyle  . Physical activity:    Days per week: Not on file    Minutes per session: Not on file  . Stress: Not on file  Relationships  . Social connections:    Talks on phone: Not on file    Gets together: Not on file    Attends religious service: Not on file    Active member of club or organization: Not on file    Attends meetings of clubs or organizations: Not on file    Relationship status: Not on file  . Intimate partner violence:    Fear of current or ex partner: Not on file    Emotionally abused: Not on file    Physically abused: Not on file    Forced sexual activity: Not on file  Other Topics Concern  . Not on file  Social History Narrative   Patient lives at home with wife [place].    Patient has no children.    Patient is college graduated.    Patient is retired.    Patient is right handed.         PHYSICAL EXAM  Vitals:   03/04/18 1114  BP: 135/75  Pulse: 65  Weight: 187 lb 9.6 oz (85.1 kg)  Height:  (1.727 m)   Body mass index is 28.52 kg/m.  Generalized: Well developed, in no acute distress  Head: normocephalic and atraumatic,. Oropharynx benign  Neck: Supple,  Musculoskeletal: No deformity   Neurological examination   Mentation: Alert oriented to time, place, Attention span and concentration appropriate.   Follows all commands speech and language fluent. MMSE not done  Cranial nerve II-XII: Pupils were equal round reactive to light extraocular movements were full, visual field were full on confrontational test. Facial sensation and strength were normal. hearing was intact to finger rubbing bilaterally. Uvula tongue midline. head turning and shoulder shrug were normal and symmetric.Tongue protrusion into cheek strength was normal. Motor: normal bulk and tone, full strength in the BUE, BLE,  Sensory: normal and symmetric to light touch,   Coordination: finger-nose-finger, heel-to-shin bilaterally, no dysmetria Reflexes: Diminished and  symmetric upper and lower, plantar responses were flexor bilaterally. Gait and Station: Rising  up from seated position without assistance, wide-based cautious gait stooped posture   no difficulty with turning ambulates with single-point cane, tandem gait not attempted   DIAGNOSTIC DATA (LABS, IMAGING, TESTING) - I reviewed patient records, labs, notes, testing and imaging myself where available.  Lab Results  Component Value Date   WBC 6.8 01/08/2018   HGB 13.8 01/08/2018   HCT 42.4 01/08/2018   MCV 98.1 01/08/2018   PLT 95 (L) 01/08/2018      Component Value Date/Time   NA 139 01/08/2018 1151   NA 139 09/03/2017 1413   K 3.7 01/08/2018 1151   CL 104 01/08/2018 1151   CO2 21 (L) 01/08/2018 1151   GLUCOSE 114 (H) 01/08/2018 1151   BUN 25 (H) 01/08/2018 1151   BUN 31 (H) 09/03/2017 1413   CREATININE 0.88 01/08/2018 1151   CALCIUM 8.9 01/08/2018 1151   PROT 6.6 09/03/2017 1413   ALBUMIN 4.1 09/03/2017 1413   AST 18 09/03/2017 1413   ALT 12 09/03/2017 1413   ALKPHOS 78 09/03/2017 1413   BILITOT 0.4 09/03/2017 1413   GFRNONAA >60 01/08/2018 1151   GFRAA >60 01/08/2018 1151   Lab Results  Component Value Date   CHOL 142 06/26/2014   HDL 55 06/26/2014   LDLCALC 66 06/26/2014   TRIG 106 06/26/2014   CHOLHDL 2.6 06/26/2014   Lab Results  Component Value Date   HGBA1C 5.8 (H) 06/26/2014   Lab Results  Component Value Date   VITAMINB12 709 09/03/2017   Lab Results  Component Value Date   TSH 3.150 09/03/2017      ASSESSMENT AND PLAN  84 y.o. year old male   history of epilepsy with recent seizure in March while his wife was in the hospital.  He did not take his seizure medications for today he is currently on Dilantin and Depakote and does not want to change his seizure regimen.  Mild cognitive impairment .CT of the head 01/08/2018 with mild diffuse cortical atrophy, mild chronic ischemic white matter disease.  B12 and TSH within normal limits previously.  Gait  abnormality  Continue Dilantin and Depakote at current doses, does not need refills Continue Aricept and Namenda for mild cognitive impairment Continue to use cane for safe ambulation needs new tip. F/U in 6 months Reviewed labs drawn 02/25/2018 at primary care.  CBC and CMP within normal limits.  Dilantin level 18.9.  Depakote level 37 Call for further seizures Nilda Riggs, [facility], [facility], APRN  [facility] [address], [address] [place], [place] [address] [phone]

## 2018-03-04 ENCOUNTER — Encounter: Payer: Self-pay | Admitting: Nurse Practitioner

## 2018-03-04 ENCOUNTER — Ambulatory Visit: Payer: Medicare Other | Admitting: Nurse Practitioner

## 2018-03-04 VITALS — BP 135/75 | HR 65 | Ht 68.0 in | Wt 187.6 lb

## 2018-03-04 DIAGNOSIS — G3184 Mild cognitive impairment, so stated: Secondary | ICD-10-CM | POA: Diagnosis not present

## 2018-03-04 DIAGNOSIS — R269 Unspecified abnormalities of gait and mobility: Secondary | ICD-10-CM

## 2018-03-04 DIAGNOSIS — G40909 Epilepsy, unspecified, not intractable, without status epilepticus: Secondary | ICD-10-CM | POA: Diagnosis not present

## 2018-03-04 NOTE — Progress Notes (Signed)
I have reviewed and agreed above plan. 

## 2018-03-04 NOTE — Patient Instructions (Signed)
Continue Dilantin and Depakote at current doses, does not need refills Continue to use cane for safe ambulation needs new tip F/U in 6 months Call for further seizures

## 2018-03-24 DIAGNOSIS — R569 Unspecified convulsions: Secondary | ICD-10-CM | POA: Diagnosis not present

## 2018-03-24 DIAGNOSIS — M1990 Unspecified osteoarthritis, unspecified site: Secondary | ICD-10-CM | POA: Diagnosis not present

## 2018-03-24 DIAGNOSIS — I471 Supraventricular tachycardia: Secondary | ICD-10-CM | POA: Diagnosis not present

## 2018-03-24 DIAGNOSIS — E782 Mixed hyperlipidemia: Secondary | ICD-10-CM | POA: Diagnosis not present

## 2018-04-22 DIAGNOSIS — J069 Acute upper respiratory infection, unspecified: Secondary | ICD-10-CM | POA: Diagnosis not present

## 2018-04-22 DIAGNOSIS — R05 Cough: Secondary | ICD-10-CM | POA: Diagnosis not present

## 2018-06-10 DIAGNOSIS — H52203 Unspecified astigmatism, bilateral: Secondary | ICD-10-CM | POA: Diagnosis not present

## 2018-06-10 DIAGNOSIS — Z961 Presence of intraocular lens: Secondary | ICD-10-CM | POA: Diagnosis not present

## 2018-06-10 DIAGNOSIS — H524 Presbyopia: Secondary | ICD-10-CM | POA: Diagnosis not present

## 2018-06-10 DIAGNOSIS — H2511 Age-related nuclear cataract, right eye: Secondary | ICD-10-CM | POA: Diagnosis not present

## 2018-06-23 DIAGNOSIS — H6122 Impacted cerumen, left ear: Secondary | ICD-10-CM | POA: Diagnosis not present

## 2018-06-23 DIAGNOSIS — Z23 Encounter for immunization: Secondary | ICD-10-CM | POA: Diagnosis not present

## 2018-07-21 DIAGNOSIS — D696 Thrombocytopenia, unspecified: Secondary | ICD-10-CM | POA: Diagnosis not present

## 2018-07-21 DIAGNOSIS — E782 Mixed hyperlipidemia: Secondary | ICD-10-CM | POA: Diagnosis not present

## 2018-07-21 DIAGNOSIS — I471 Supraventricular tachycardia: Secondary | ICD-10-CM | POA: Diagnosis not present

## 2018-07-21 DIAGNOSIS — G40909 Epilepsy, unspecified, not intractable, without status epilepticus: Secondary | ICD-10-CM | POA: Diagnosis not present

## 2018-07-22 ENCOUNTER — Other Ambulatory Visit: Payer: Self-pay | Admitting: Neurology

## 2018-08-21 ENCOUNTER — Other Ambulatory Visit: Payer: Self-pay | Admitting: Neurology

## 2018-09-16 NOTE — Progress Notes (Signed)
[facility]  PATIENT: Zachary Ali DOB: [birth year]   REASON FOR VISIT: Follow-up for seizure disorder with last  seizure in March 2019 due to missing doses of medication HISTORY FROM: Patient and wife and caregiver    HISTORY OF PRESENT ILLNESS:  09/03/17 [name] 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  No seizures in several years. Denies any falls, using a single-point cane. Patient remains on Dilantin and Depakote. Discussed again stopping Dilantin and adding Keppra but pt is reluctant.  No new neurologic complaints.       HISTORY: initial evaluation by Dr Terrace Arabia 08/17/10. He is referred to this clinic, because there was no longer recurrent seizure, and with this worsening gait difficulty, wondering if we can make some adjustment of his epileptiic medications  PMHx.He has past medical history of tachycardia is taking beta blocker, also has a history of seizure, has been on long-term epileptic medications. He began to experience seizures since age 70, initially it was frequent Petit mal seizure, up to 12-16 episodes in a day, he has been treated with combination of Dilantin, and phenobarbital since age 30, without effectively control his petit mal seizure. In addition he also has some infrequent generalized tonic-clonic seizure. In 1976, he was started on Depakote, which has drastically change his seizure, his petit mal seizure was under much better control, while he was on Depakote and phenobarbital combination, he developed a generalized tonic-clonic seizure, Dilantin was reintroduced later, he has been on current dose of 3 medication for 35 years. Dilantin100 mg t.i.d. phenobarbital 100 mg every morning. Depakote500 mg t.i.d.  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 Over the past couple years, he was noticed to have  unsteady gait. he denied incontinence, bilateral lower extremity paresthesia. He has successfully weaned off phenobarbital now, he feels better afterwards, there is no recurrent seizure. He is continue taking depakote 500mg  tid, dilantin 100 tid. MRI brain showed mild atrophy, no acute lesions. EEG, had mild background slowing, no epilepsy discharges. he is still on Depakote 500 mg 3 times a day, Dilantin 100 mg 3 times a day, no recurrent seizure, He was admitted to the hospital June 24 2014, for worsening confusion, dysarthria, was diagnosed with UTI, repeat MRI of the brain showed no acute lesions, MRA showed intracranial atherosclerotic disease, he was started on baby aspirin daily, He was discharged home after rehabilitation,overall doing very well,  Depakote level in hospital September 2015, 54.8, Dilantin was 10  UPDATE Sep 03 2017:YY He has fell few time, Oct 19, Aug 29 2017, worsening unsteady gait, no recurrent seizure, he does not want to change his antiepileptic medications, he is taking dilantin 100mg  tid, Depakote DR 500mg  tid.   Wife also concerned about his ability of driving. UPDATE 5/21/2019CM  Zachary Ali, 84 year old male returns for follow-up with history of seizure disorder.  Last seizure occurred on 01/08/2018 while  his wife was in the hospital, he was home alone and did not take his seizure medication for 2 days.  They now have a caregiver 7 hours a day and home health agency is involved as well.  No further seizure activity since being back on medication.  He denies any falls uses a single-point cane.  Appetite is good and he is sleeping well.  He returns for reevaluation UPDATE 12/4/2019CM Zachary Ali, 84 year old male returns for follow-up with history of seizure disorder and memory loss.  Last seizure was January 09, 2016 while his wife was in the hospital and he was home alone and did not take his medications for 2 days.  He is currently on Dilantin and Depakote  without further seizure activity being back on medication.  One fall in the last 6 months no injury.  He also is on Aricept and Namenda for his memory  .  He ambulates with a walker.  Appetite is good and he is sleeping well.  Labs done through primary care.  Patient has a caregiver 7 hours a day from a home health agency.  He returns for reevaluation   REVIEW OF SYSTEMS: Full 14 system review of systems performed and notable only for those listed, all others are neg:  Constitutional: neg  Cardiovascular: neg Ear/Nose/Throat: neg  Skin: neg Eyes: neg Respiratory: neg Gastroitestinal: neg  Hematology/Lymphatic: neg  Endocrine: neg Musculoskeletal: Gait abnormality Allergy/Immunology: neg Neurological: Seizure disorder, memory loss Psychiatric: neg Sleep : neg   ALLERGIES: No Known Allergies  HOME MEDICATIONS: Outpatient Medications Prior to Visit  Medication Sig Dispense Refill  . acetaminophen (TYLENOL) 325 MG tablet Take 2 tablets (650 mg total) by mouth every 6 (six) hours as needed for mild pain, moderate pain, fever or headache (or Fever >/= 101).    Marland Kitchen aspirin EC 81 MG EC tablet Take 1 tablet (81 mg total) by mouth daily.    Marland Kitchen DILANTIN 100 MG ER capsule TAKE (1) CAPSULE THREE TIMES DAILY. 90 capsule 5  . divalproex (DEPAKOTE) 500 MG DR tablet TAKE 1 TABLET BY MOUTH 3 TIMES A DAY. 270 tablet 0  . donepezil (ARICEPT) 10 MG tablet Take 1 tablet (10 mg total) by mouth at bedtime. 30 tablet 11  . finasteride (PROSCAR) 5 MG tablet Take 5 mg by mouth every morning.   2  . furosemide (LASIX) 40 MG tablet Take 40 mg by mouth daily.   10  . levofloxacin (LEVAQUIN) 500 MG tablet 500 mg.  0  . memantine (NAMENDA) 10 MG tablet TAKE 1 TABLET BY MOUTH TWICE DAILY. 180 tablet 0  . metoprolol succinate (TOPROL-XL) 25 MG 24 hr tablet Take 25 mg by mouth every morning.     . potassium chloride (KLOR-CON) 8 MEQ tablet Take 8 mEq by mouth daily.   5  . donepezil (ARICEPT) 10 MG tablet TAKE ONE  TABLET AT BEDTIME. 90 tablet 0   No facility-administered medications prior to visit.     PAST MEDICAL HISTORY: Past Medical History:  Diagnosis Date  . Allergic rhinitis   . BPH (benign prostatic hypertrophy)    seen urology, Dr. Earlene Plater  . Bronchitis    episode of 2/10  . Cerebral hemorrhage (HCC)    approx 50 years ago  . Dyslipidemia    diet control  . Edema leg    hx of  . Elevated PSA    dr. Earlene Plater PSA 3-6; no PSA-urology f/u for exam yrly  . Foley catheter in place   . Insomnia   . OA (osteoarthritis) of knee    left   . Palpitations    w/u with Card-Dr. Anne Fu  . Paroxysmal supraventricular tachycardia (HCC)   . Pneumonia    hx of   . Seizure (HCC) 01/08/2018   since childhood, Dr. Debarah Crape; off phenoparb since 2011, after many yr, doing ok  . Small vessel disease (HCC)   . SOB (shortness of breath)    walking distances/climbing stairs  . Stroke (HCC)    hx of  CVA  . SVT (supraventricular tachycardia) (HCC)   . Urinary tract infection    hx of     PAST SURGICAL HISTORY: Past Surgical History:  Procedure Laterality Date  . CATARACT EXTRACTION     2011-12/left   . GREEN LIGHT LASER TURP (TRANSURETHRAL RESECTION OF PROSTATE N/A 04/05/2015   Procedure: GREEN LIGHT LASER TURP (TRANSURETHRAL RESECTION OF PROSTATE;  Surgeon: Jerilee Field, MD;  Location: [facility];  Service: Urology;  Laterality: N/A;  . HEMORRHOID SURGERY    . US ECHOCARDIOGRAPHY     ok; stress test ok-05-2013 nl LVF, tr AR  . VASECTOMY      FAMILY HISTORY: Family History  Problem Relation Age of Onset  . Heart disease Mother   . Coronary artery disease Mother   . Coronary artery disease Father   . Heart disease Father   . Heart failure Unknown     SOCIAL HISTORY: Social History   Socioeconomic History  . Marital status: Married    Spouse name: Hope  . Number of children: 0  . Years of education: College  . Highest education level: Not on file  Occupational History  . Not on file    Social Needs  . Financial resource strain: Not on file  . Food insecurity:    Worry: Not on file    Inability: Not on file  . Transportation needs:    Medical: Not on file    Non-medical: Not on file  Tobacco Use  . Smoking status: Former Smoker    Packs/day: 2.00    Years: 20.00    Pack years: 40.00    Types: Cigarettes    Last attempt to quit: 10/15/1974    Years since quitting: 43.9  . Smokeless tobacco: Never Used  Substance and Sexual Activity  . Alcohol use: Yes    Alcohol/week: 5.0 standard drinks    Types: 5 Glasses of wine per week    Comment: occas   . Drug use: No  . Sexual activity: Not on file  Lifestyle  . Physical activity:    Days per week: Not on file    Minutes per session: Not on file  . Stress: Not on file  Relationships  . Social connections:    Talks on phone: Not on file    Gets together: Not on file    Attends religious service: Not on file    Active member of club or organization: Not on file    Attends meetings of clubs or organizations: Not on file    Relationship status: Not on file  . Intimate partner violence:    Fear of current or ex partner: Not on file    Emotionally abused: Not on file    Physically abused: Not on file    Forced sexual activity: Not on file  Other Topics Concern  . Not on file  Social History Narrative   Patient lives at home with wife [place].    Patient has no children.    Patient is college graduated.    Patient is retired.    Patient is right handed.         PHYSICAL EXAM  Vitals:   09/17/18 1008  BP: 122/65  Pulse: 62  Weight: 182 lb 3.2 oz (82.6 kg)  Height: 5\' 8"  (1.727 m)   Body mass index is 27.7 kg/m.  Generalized: Well developed, in no acute distress , well-groomed Head: normocephalic and atraumatic,. Oropharynx benign  Neck: Supple,  Musculoskeletal: No deformity  Neurological examination   Mentation: Alert  MMSE - Mini Mental State Exam 09/17/2018 09/04/2017  Not completed: (No  Data) -  Orientation to time 2 4  Orientation to Place 1 5  Registration 3 3  Attention/ Calculation 5 5  Recall 0 1  Language- name 2 objects 2 2  Language- repeat 1 1  Language- follow 3 step command 3 3  Language- read & follow direction 1 1  Write a sentence 0 1  Copy design 1 0  Total score 19 26    Follows all commands speech and language fluent.  Cranial nerve II-XII: Pupils were equal round reactive to light extraocular movements were full, visual field were full on confrontational test. Facial sensation and strength were normal. hearing was intact to finger rubbing bilaterally. Uvula tongue midline. head turning and shoulder shrug were normal and symmetric.Tongue protrusion into cheek strength was normal. Motor: normal bulk and tone, full strength in the BUE, BLE,  Sensory: normal and symmetric to light touch,   Coordination: finger-nose-finger,  no dysmetria apraxia with the use of extremities Reflexes: Diminished and symmetric upper and lower, plantar responses were flexor bilaterally. Gait and Station: Rising up from seated position with push off,wide-based cautious gait stooped posture   no difficulty with turning ambulates with rolling walker, tandem gait not attempted   DIAGNOSTIC DATA (LABS, IMAGING, TESTING) - I reviewed patient records, labs, notes, testing and imaging myself where available.  Lab Results  Component Value Date   WBC 6.8 01/08/2018   HGB 13.8 01/08/2018   HCT 42.4 01/08/2018   MCV 98.1 01/08/2018   PLT 95 (L) 01/08/2018      Component Value Date/Time   NA 139 01/08/2018 1151   NA 139 09/03/2017 1413   K 3.7 01/08/2018 1151   CL 104 01/08/2018 1151   CO2 21 (L) 01/08/2018 1151   GLUCOSE 114 (H) 01/08/2018 1151   BUN 25 (H) 01/08/2018 1151   BUN 31 (H) 09/03/2017 1413   CREATININE 0.88 01/08/2018 1151   CALCIUM 8.9 01/08/2018 1151   PROT 6.6 09/03/2017 1413   ALBUMIN 4.1 09/03/2017 1413   AST 18 09/03/2017 1413   ALT 12 09/03/2017 1413    ALKPHOS 78 09/03/2017 1413   BILITOT 0.4 09/03/2017 1413   GFRNONAA >60 01/08/2018 1151   GFRAA >60 01/08/2018 1151   Lab Results  Component Value Date   CHOL 142 06/26/2014   HDL 55 06/26/2014   LDLCALC 66 06/26/2014   TRIG 106 06/26/2014   CHOLHDL 2.6 06/26/2014   Lab Results  Component Value Date   HGBA1C 5.8 (H) 06/26/2014   Lab Results  Component Value Date   VITAMINB12 709 09/03/2017   Lab Results  Component Value Date   TSH 3.150 09/03/2017      ASSESSMENT AND PLAN  84 y.o. year old male   history of epilepsy with recent seizure in March while his wife was in the hospital.  He did not take his seizure medications for 2 days. He is currently on Dilantin and Depakote and does not want to change his seizure regimen.  Memory loss..CT of the head 01/08/2018 with mild diffuse cortical atrophy, mild chronic ischemic white matter disease.  B12 and TSH within normal limits previously.  Gait abnormality uses walker  Continue Dilantin and Depakote at current doses, does not need refills Continue Aricept for mild cognitive impairment will refill Continue Namenda for mild cognitive impairment Continue to use walker for safe ambulation  F/U in 6  months Call for further seizures Nilda Riggs, [facility], [facility], APRN  [facility] [address], [address] [place], [place] [address] [phone]

## 2018-09-17 ENCOUNTER — Encounter: Payer: Self-pay | Admitting: Nurse Practitioner

## 2018-09-17 ENCOUNTER — Ambulatory Visit: Payer: Medicare Other | Admitting: Nurse Practitioner

## 2018-09-17 VITALS — BP 122/65 | HR 62 | Ht 68.0 in | Wt 182.2 lb

## 2018-09-17 DIAGNOSIS — G40909 Epilepsy, unspecified, not intractable, without status epilepticus: Secondary | ICD-10-CM

## 2018-09-17 DIAGNOSIS — R269 Unspecified abnormalities of gait and mobility: Secondary | ICD-10-CM | POA: Diagnosis not present

## 2018-09-17 DIAGNOSIS — R413 Other amnesia: Secondary | ICD-10-CM

## 2018-09-17 MED ORDER — DONEPEZIL HCL 10 MG PO TABS: 10.0000 mg | ORAL_TABLET | Freq: Every day | ORAL | 11 refills | Status: DC

## 2018-09-17 NOTE — Patient Instructions (Signed)
Continue Dilantin and Depakote at current doses, does not need refills Continue Aricept for mild cognitive impairment will refill Continue Namenda for mild cognitive impairment Continue to use walker for safe ambulation  F/U in 6 months Call for further seizures

## 2018-09-17 NOTE — Progress Notes (Signed)
I have reviewed and agreed above plan. 

## 2018-11-10 ENCOUNTER — Other Ambulatory Visit: Payer: Self-pay | Admitting: Neurology

## 2018-11-25 DIAGNOSIS — E782 Mixed hyperlipidemia: Secondary | ICD-10-CM | POA: Diagnosis not present

## 2018-11-25 DIAGNOSIS — I471 Supraventricular tachycardia: Secondary | ICD-10-CM | POA: Diagnosis not present

## 2018-11-25 DIAGNOSIS — M1990 Unspecified osteoarthritis, unspecified site: Secondary | ICD-10-CM | POA: Diagnosis not present

## 2018-11-25 DIAGNOSIS — R569 Unspecified convulsions: Secondary | ICD-10-CM | POA: Diagnosis not present

## 2018-11-27 DIAGNOSIS — N182 Chronic kidney disease, stage 2 (mild): Secondary | ICD-10-CM | POA: Diagnosis not present

## 2018-11-27 DIAGNOSIS — D696 Thrombocytopenia, unspecified: Secondary | ICD-10-CM | POA: Diagnosis not present

## 2018-11-27 DIAGNOSIS — N139 Obstructive and reflux uropathy, unspecified: Secondary | ICD-10-CM | POA: Diagnosis not present

## 2018-11-27 DIAGNOSIS — F320 Major depressive disorder, single episode, mild: Secondary | ICD-10-CM | POA: Diagnosis not present

## 2018-11-27 DIAGNOSIS — I471 Supraventricular tachycardia: Secondary | ICD-10-CM | POA: Diagnosis not present

## 2018-11-27 DIAGNOSIS — I872 Venous insufficiency (chronic) (peripheral): Secondary | ICD-10-CM | POA: Diagnosis not present

## 2018-11-27 DIAGNOSIS — M1712 Unilateral primary osteoarthritis, left knee: Secondary | ICD-10-CM | POA: Diagnosis not present

## 2018-11-27 DIAGNOSIS — F0390 Unspecified dementia without behavioral disturbance: Secondary | ICD-10-CM | POA: Diagnosis not present

## 2018-11-27 DIAGNOSIS — G40909 Epilepsy, unspecified, not intractable, without status epilepticus: Secondary | ICD-10-CM | POA: Diagnosis not present

## 2018-11-27 DIAGNOSIS — N401 Enlarged prostate with lower urinary tract symptoms: Secondary | ICD-10-CM | POA: Diagnosis not present

## 2018-12-09 ENCOUNTER — Encounter: Payer: Self-pay | Admitting: Sports Medicine

## 2018-12-09 ENCOUNTER — Ambulatory Visit: Payer: Medicare Other | Admitting: Sports Medicine

## 2018-12-09 VITALS — BP 136/71 | HR 61 | Resp 16

## 2018-12-09 DIAGNOSIS — M79674 Pain in right toe(s): Secondary | ICD-10-CM

## 2018-12-09 DIAGNOSIS — B351 Tinea unguium: Secondary | ICD-10-CM | POA: Diagnosis not present

## 2018-12-09 DIAGNOSIS — L608 Other nail disorders: Secondary | ICD-10-CM

## 2018-12-09 DIAGNOSIS — I739 Peripheral vascular disease, unspecified: Secondary | ICD-10-CM

## 2018-12-09 DIAGNOSIS — M79675 Pain in left toe(s): Secondary | ICD-10-CM | POA: Diagnosis not present

## 2018-12-09 NOTE — Progress Notes (Signed)
Subjective: Zachary Ali is a 85 y.o. male patient seen today in office with complaint of mildly painful thickened and elongated toenails; unable to trim. Patient denies history of Diabetes or neuropathy. Patient has vascular disease with swelling at both lower legs per nursing aide who states that she noticed blood at the right 5th toenail 2 months ago and the nail growing upwards. Patient denies trauma. Patient has no other pedal complaints at this time.   Review of Systems  All other systems reviewed and are negative.    Patient Active Problem List   Diagnosis Date Noted  . Memory loss 09/17/2018  . Mild cognitive impairment 09/03/2017  . Gait abnormality 09/03/2017  . Seizure disorder (HCC) 08/28/2016  . UTI (urinary tract infection) 12/08/2014  . Acute renal failure (HCC) 12/08/2014  . Urinary retention 12/08/2014  . Thrombocytopenia (HCC) 12/08/2014  . Anemia 12/08/2014  . Lung nodule 12/08/2014  . Fall at home 12/08/2014  . Diarrhea 12/08/2014  . UTI (lower urinary tract infection) 12/08/2014  . Altered mental state 06/25/2014  . CVA (cerebral infarction) 06/25/2014  . 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 Benign prostate hyperplasia 03/03/2012  . Elevated prostate specific antigen (PSA) 09/03/2011  . Urinary urgency 09/03/2011    Current Outpatient Medications on File Prior to Visit  Medication Sig Dispense Refill  . acetaminophen (TYLENOL) 325 MG tablet Take 2 tablets (650 mg total) by mouth every 6 (six) hours as needed for mild pain, moderate pain, fever or headache (or Fever >/= 101).    Marland Kitchen aspirin EC 81 MG EC tablet Take 1 tablet (81 mg total) by mouth daily.    Marland Kitchen DILANTIN 100 MG ER capsule TAKE (1) CAPSULE THREE TIMES DAILY. 90 capsule 5  . divalproex (DEPAKOTE) 500 MG DR tablet TAKE 1 TABLET BY MOUTH 3 TIMES A DAY. 270 tablet 0  . donepezil (ARICEPT) 10 MG tablet TAKE ONE TABLET AT BEDTIME. 90 tablet 0  . finasteride (PROSCAR) 5 MG tablet Take 5  mg by mouth every morning.   2  . furosemide (LASIX) 40 MG tablet Take 40 mg by mouth daily.   10  . memantine (NAMENDA) 10 MG tablet TAKE 1 TABLET BY MOUTH TWICE DAILY. 180 tablet 0  . metoprolol succinate (TOPROL-XL) 25 MG 24 hr tablet Take 25 mg by mouth every morning.     . potassium chloride (KLOR-CON) 8 MEQ tablet Take 8 mEq by mouth daily.   5   No current facility-administered medications on file prior to visit.     No Known Allergies  Objective: Physical Exam  General: Well developed, nourished, no acute distress, awake, alert and oriented x 3  Vascular: Dorsalis pedis artery 1/4 bilateral, Posterior tibial artery 0/4 bilateral, skin temperature warm to warm proximal to distal bilateral lower extremities, ++varicosities and +1 pitting edema bilateral, no pedal hair present bilateral.  Neurological: Gross sensation present via light touch bilateral.   Dermatological: Skin is warm, dry, and supple bilateral, Nails 1-10 are tender, long, thick, and discolored with mild subungal debris and dry blood at right 5th toenail, no webspace macerations present bilateral, no open lesions present bilateral, no callus/corns/hyperkeratotic tissue present bilateral. No signs of infection bilateral.  Musculoskeletal: No symptomatic boney deformities noted bilateral. Muscular strength within normal limits without painon range of motion. No pain with calf compression bilateral.  Assessment and Plan:  Problem List Items Addressed This Visit    None    Visit Diagnoses    Pain due to  onychomycosis of toenails of both feet    -  Primary   Hemorrhage of nail       PVD (peripheral vascular disease) (HCC)          -Examined patient.  -Discussed treatment options for painful mycotic nails. -Mechanically debrided and reduced mycotic nails with sterile nail nipper and dremel nail file without incident. -Advised patient that dry blood will grow out at right 5th toenail and to be careful not to injury  it or bump it on walker -Patient to get compression stockings as ordered by PCP -Patient to return in 3 months for follow up evaluation or sooner if symptoms worsen.  Asencion Islam, DPM

## 2018-12-29 DIAGNOSIS — F320 Major depressive disorder, single episode, mild: Secondary | ICD-10-CM | POA: Diagnosis not present

## 2018-12-29 DIAGNOSIS — I872 Venous insufficiency (chronic) (peripheral): Secondary | ICD-10-CM | POA: Diagnosis not present

## 2018-12-29 DIAGNOSIS — M1712 Unilateral primary osteoarthritis, left knee: Secondary | ICD-10-CM | POA: Diagnosis not present

## 2018-12-29 DIAGNOSIS — F0390 Unspecified dementia without behavioral disturbance: Secondary | ICD-10-CM | POA: Diagnosis not present

## 2018-12-29 DIAGNOSIS — N139 Obstructive and reflux uropathy, unspecified: Secondary | ICD-10-CM | POA: Diagnosis not present

## 2018-12-29 DIAGNOSIS — N401 Enlarged prostate with lower urinary tract symptoms: Secondary | ICD-10-CM | POA: Diagnosis not present

## 2018-12-29 DIAGNOSIS — N182 Chronic kidney disease, stage 2 (mild): Secondary | ICD-10-CM | POA: Diagnosis not present

## 2018-12-29 DIAGNOSIS — I471 Supraventricular tachycardia: Secondary | ICD-10-CM | POA: Diagnosis not present

## 2018-12-29 DIAGNOSIS — D696 Thrombocytopenia, unspecified: Secondary | ICD-10-CM | POA: Diagnosis not present

## 2018-12-29 DIAGNOSIS — G40909 Epilepsy, unspecified, not intractable, without status epilepticus: Secondary | ICD-10-CM | POA: Diagnosis not present

## 2019-01-13 ENCOUNTER — Other Ambulatory Visit: Payer: Self-pay | Admitting: Neurology

## 2019-02-07 ENCOUNTER — Other Ambulatory Visit: Payer: Self-pay | Admitting: Neurology

## 2019-03-03 DIAGNOSIS — R3914 Feeling of incomplete bladder emptying: Secondary | ICD-10-CM | POA: Diagnosis not present

## 2019-03-03 DIAGNOSIS — N401 Enlarged prostate with lower urinary tract symptoms: Secondary | ICD-10-CM | POA: Diagnosis not present

## 2019-03-06 ENCOUNTER — Other Ambulatory Visit: Payer: Self-pay | Admitting: Neurology

## 2019-03-10 ENCOUNTER — Ambulatory Visit: Payer: Medicare Other | Admitting: Sports Medicine

## 2019-03-10 ENCOUNTER — Other Ambulatory Visit: Payer: Self-pay

## 2019-03-10 ENCOUNTER — Encounter: Payer: Self-pay | Admitting: Sports Medicine

## 2019-03-10 VITALS — Temp 97.2°F

## 2019-03-10 DIAGNOSIS — M79675 Pain in left toe(s): Secondary | ICD-10-CM | POA: Diagnosis not present

## 2019-03-10 DIAGNOSIS — M79674 Pain in right toe(s): Secondary | ICD-10-CM

## 2019-03-10 DIAGNOSIS — G3184 Mild cognitive impairment, so stated: Secondary | ICD-10-CM

## 2019-03-10 DIAGNOSIS — I739 Peripheral vascular disease, unspecified: Secondary | ICD-10-CM | POA: Diagnosis not present

## 2019-03-10 DIAGNOSIS — B351 Tinea unguium: Secondary | ICD-10-CM | POA: Diagnosis not present

## 2019-03-10 NOTE — Progress Notes (Signed)
Subjective: Zachary Ali is a 85 y.o. male patient seen today in office with complaint of mildly painful thickened and elongated toenails; unable to trim. Patient report that his right 5th toenail is doing good and has healed. Patient has no other pedal complaints at this time.   Patient is assisted by wife.    Patient Active Problem List   Diagnosis Date Noted  . Memory loss 09/17/2018  . Mild cognitive impairment 09/03/2017  . Gait abnormality 09/03/2017  . Seizure disorder (HCC) 08/28/2016  . UTI (urinary tract infection) 12/08/2014  . Acute renal failure (HCC) 12/08/2014  . Urinary retention 12/08/2014  . Thrombocytopenia (HCC) 12/08/2014  . Anemia 12/08/2014  . Lung nodule 12/08/2014  . Fall at home 12/08/2014  . Diarrhea 12/08/2014  . UTI (lower urinary tract infection) 12/08/2014  . Altered mental state 06/25/2014  . CVA (cerebral infarction) 06/25/2014  . 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 Benign prostate hyperplasia 03/03/2012  . Elevated prostate specific antigen (PSA) 09/03/2011  . Urinary urgency 09/03/2011    Current Outpatient Medications on File Prior to Visit  Medication Sig Dispense Refill  . acetaminophen (TYLENOL) 325 MG tablet Take 2 tablets (650 mg total) by mouth every 6 (six) hours as needed for mild pain, moderate pain, fever or headache (or Fever >/= 101).    Marland Kitchen aspirin EC 81 MG EC tablet Take 1 tablet (81 mg total) by mouth daily.    Marland Kitchen DILANTIN 100 MG ER capsule TAKE (1) CAPSULE THREE TIMES DAILY. 90 capsule 5  . divalproex (DEPAKOTE) 500 MG DR tablet TAKE 1 TABLET BY MOUTH 3 TIMES A DAY. 270 tablet 0  . donepezil (ARICEPT) 10 MG tablet TAKE ONE TABLET AT BEDTIME. 90 tablet 0  . finasteride (PROSCAR) 5 MG tablet Take 5 mg by mouth every morning.   2  . furosemide (LASIX) 40 MG tablet Take 40 mg by mouth daily.   10  . memantine (NAMENDA) 10 MG tablet TAKE 1 TABLET BY MOUTH TWICE DAILY. 180 tablet 0  . metoprolol succinate  (TOPROL-XL) 25 MG 24 hr tablet Take 25 mg by mouth every morning.     . potassium chloride (KLOR-CON) 8 MEQ tablet Take 8 mEq by mouth daily.   5   No current facility-administered medications on file prior to visit.     No Known Allergies  Objective: Physical Exam  General: Well developed, nourished, no acute distress, awake, alert and oriented x 3  Vascular: Dorsalis pedis artery 1/4 bilateral, Posterior tibial artery 0/4 bilateral, skin temperature warm to warm proximal to distal bilateral lower extremities, ++varicosities and +1 pitting edema bilateral, no pedal hair present bilateral.  Neurological: Gross sensation present via light touch bilateral.   Dermatological: Skin is warm, dry, and supple bilateral, Nails 1-10 are tender, long, thick, and discolored with mild subungal debris and dry blood, right 5th toenail well healed, no callus/corns/hyperkeratotic tissue present bilateral. No signs of infection bilateral.  Musculoskeletal: No symptomatic boney deformities noted bilateral. Muscular strength within normal limits without painon range of motion. No pain with calf compression bilateral.  Assessment and Plan:  Problem List Items Addressed This Visit      Other   Mild cognitive impairment    Other Visit Diagnoses    Pain due to onychomycosis of toenails of both feet    -  Primary   PVD (peripheral vascular disease) (HCC)          -Examined patient.  -Discussed treatment options for painful  mycotic nails. -Mechanically debrided and reduced mycotic nails with sterile nail nipper and dremel nail file without incident. -Patient to get compression stockings as ordered by PCP like before -Patient to return in 3 months for follow up evaluation/nail trim or sooner if symptoms worsen.  Asencion Islamitorya Kalei Mckillop, DPM

## 2019-03-24 ENCOUNTER — Telehealth: Payer: Self-pay | Admitting: Neurology

## 2019-03-24 ENCOUNTER — Encounter: Payer: Self-pay | Admitting: Neurology

## 2019-03-24 ENCOUNTER — Other Ambulatory Visit: Payer: Self-pay

## 2019-03-24 ENCOUNTER — Ambulatory Visit (INDEPENDENT_AMBULATORY_CARE_PROVIDER_SITE_OTHER): Payer: Medicare Other | Admitting: Neurology

## 2019-03-24 DIAGNOSIS — R413 Other amnesia: Secondary | ICD-10-CM | POA: Diagnosis not present

## 2019-03-24 DIAGNOSIS — G40909 Epilepsy, unspecified, not intractable, without status epilepticus: Secondary | ICD-10-CM

## 2019-03-24 MED ORDER — DONEPEZIL HCL 10 MG PO TABS
10.0000 mg | ORAL_TABLET | Freq: Every day | ORAL | 4 refills | Status: AC
Start: 2019-03-24 — End: ?

## 2019-03-24 MED ORDER — MEMANTINE HCL 10 MG PO TABS
10.0000 mg | ORAL_TABLET | Freq: Two times a day (BID) | ORAL | 4 refills | Status: AC
Start: 2019-03-24 — End: ?

## 2019-03-24 MED ORDER — DIVALPROEX SODIUM 500 MG PO DR TAB: 500.0000 mg | DELAYED_RELEASE_TABLET | Freq: Three times a day (TID) | ORAL | 4 refills | Status: DC

## 2019-03-24 NOTE — Telephone Encounter (Signed)
Please call patient to check on him on July 10th 2020, I have asked him to taper off dilantin, decrease 100 mg every 2 weeks, staring June 10th, 2020

## 2019-03-24 NOTE — Progress Notes (Signed)
[facility]  HISTORY OF PRESENT ILLNESS:  09/03/17 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  No seizures in several years. Denies any falls, using a single-point cane. Patient remains on Dilantin and Depakote. Discussed again stopping Dilantin and adding Keppra but pt is reluctant.  No new neurologic complaints.       HISTORY: initial evaluation by Dr Terrace ArabiaYan 08/17/10. He is referred to this clinic, because there was no longer recurrent seizure, and with this worsening gait difficulty, wondering if we can make some adjustment of his epileptiic medications  PMHx.He has past medical history of tachycardia is taking beta blocker, also has a history of seizure, has been on long-term epileptic medications. He began to experience seizures since age 85, initially it was frequent Petit mal seizure, up to 12-16 episodes in a day, he has been treated with combination of Dilantin, and phenobarbital since age 85, without effectively control his petit mal seizure. In addition he also has some infrequent generalized tonic-clonic seizure. In 1976, he was started on Depakote, which has drastically change his seizure, his petit mal seizure was under much better control, while he was on Depakote and phenobarbital combination, he developed a generalized tonic-clonic seizure, Dilantin was reintroduced later, he has been on current dose of 3 medication for 35 years. Dilantin100 mg t.i.d. phenobarbital 100 mg every morning. Depakote500 mg t.i.d.  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 Over the past couple years, he was noticed to have unsteady gait. he denied incontinence, bilateral lower extremity paresthesia. He has successfully weaned off phenobarbital now, he feels better afterwards, there is no recurrent seizure. He is continue taking  depakote 500mg  tid, dilantin 100 tid. MRI brain showed mild atrophy, no acute lesions. EEG, had mild background slowing, no epilepsy discharges. he is still on Depakote 500 mg 3 times a day, Dilantin 100 mg 3 times a day, no recurrent seizure, He was admitted to the hospital June 24 2014, for worsening confusion, dysarthria, was diagnosed with UTI, repeat MRI of the brain showed no acute lesions, MRA showed intracranial atherosclerotic disease, he was started on baby aspirin daily, He was discharged home after rehabilitation,overall doing very well,  Depakote level in hospital September 2015, 54.8, Dilantin was 10  UPDATE Sep 03 2017:YY He has fell few time, Oct 19, Aug 29 2017, worsening unsteady gait, no recurrent seizure, he does not want to change his antiepileptic medications, he is taking dilantin 100mg  tid, Depakote DR 500mg  tid.   Wife also concerned about his ability of driving.  Virtual Visit via Video  I connected with Zachary Ali on 03/24/19 at  by Video and verified that I am speaking with the correct person using two identifiers.   I discussed the limitations, risks, security and privacy concerns of performing an evaluation and management service by video and the availability of in person appointments. I also discussed with the patient that there may be a patient responsible charge related to this service. The patient expressed understanding and agreed to proceed.   History of Present Illness: He lives at home with his wife, overall  He is doing very well, he had no recurrent seizure, tolerating Dilantin ER 100 mg 3 times a day, Depakote 500 mg 3 times a day, and also taking Aricept, Namenda, has slow worsening memory loss  After discussed with patient and his wife, we decided to taper off Dilantin,  100 mg decrements every 2 weeks  Most recent seizure was on January 08 2018,  When he missed his dilantin    Observations/Objective: I have reviewed problem lists,  medications, allergies.  He is awake, alert, oriented to history taking and casual conversation, moving four extremities without difficulty  Assessment and Plan: Epilepsy  Taper off dilantin  Keep Depakote DR 500mg  tid. Memory loss  Keep aricept 10mg  daiily and namenda 10mg  bid.     Follow Up Instructions:  In 6 months with NP Judson Roch    I discussed the assessment and treatment plan with the patient. The patient was provided an opportunity to ask questions and all were answered. The patient agreed with the plan and demonstrated an understanding of the instructions.   The patient was advised to call back or seek an in-person evaluation if the symptoms worsen or if the condition fails to improve as anticipated.  I provided 25 minutes of non-face-to-face time during this encounter.   Zachary Pacas, MD

## 2019-03-25 NOTE — Telephone Encounter (Signed)
I have set up a reminder to call this patient to check on him.

## 2019-04-13 ENCOUNTER — Telehealth: Payer: Self-pay | Admitting: Neurology

## 2019-04-13 DIAGNOSIS — G40909 Epilepsy, unspecified, not intractable, without status epilepticus: Secondary | ICD-10-CM

## 2019-04-13 DIAGNOSIS — R413 Other amnesia: Secondary | ICD-10-CM

## 2019-04-13 MED ORDER — LAMOTRIGINE 25 MG PO TABS
25.0000 mg | ORAL_TABLET | Freq: Every day | ORAL | 0 refills | Status: DC
Start: 2019-04-13 — End: 2019-07-10

## 2019-04-13 MED ORDER — LAMOTRIGINE 100 MG PO TABS: 100.0000 mg | ORAL_TABLET | Freq: Every day | ORAL | 11 refills | Status: DC

## 2019-04-13 NOTE — Telephone Encounter (Signed)
Lamictal 25mg  prescription printed due to length of instructions.  It has been signed, faxed and confirmed to [facility].

## 2019-04-13 NOTE — Telephone Encounter (Signed)
I have spoken with his wife who witnessed him having a grand mal seizure this morning that lasted approximately 3-5 minutes.  He has now returned back to baseline and is resting.  He has continued taking Depakote ER 500mg , one tablet TID.  He is in his third week of tapering off Dilantin ER.  His currently dose of Dilatnin ER100mg  is one tablet at bedtime (previously on one tablet TID).  She is unsure if he should continue to be tapered off the medication or if he should restart his prior dose.  States it has been nearly 30 years since his last seizure.

## 2019-04-13 NOTE — Telephone Encounter (Signed)
Pts wife Hope called in and stated pt just had a seizure about 12min ago and she wants to know what to do being that it was discussed to remove him off Dilantin. Please advise

## 2019-04-13 NOTE — Telephone Encounter (Signed)
Have talked with patient's wife, he is in the process of tapering down his Dilantin 100 mg, was on 1 tablet 3 times a day, has been taking 1 tablet every night for 1 week, also Depakote DR 500 mg 3 times a day, had a witnessed generalized tonic-clonic seizure this morning, now is back to his baseline  After discussed with his wife, we will continue tapering down Dilantin as previously scheduled,  1, keep Depakote DR 500 mg 3 times a day 2.  Add on lamotrigine 25 mg, titrating to 4 tablets 3 times a day 3.  Sending home health nursing aide for medication management

## 2019-04-14 DIAGNOSIS — E782 Mixed hyperlipidemia: Secondary | ICD-10-CM | POA: Diagnosis not present

## 2019-04-14 DIAGNOSIS — I471 Supraventricular tachycardia: Secondary | ICD-10-CM | POA: Diagnosis not present

## 2019-04-14 DIAGNOSIS — D696 Thrombocytopenia, unspecified: Secondary | ICD-10-CM | POA: Diagnosis not present

## 2019-04-14 DIAGNOSIS — R569 Unspecified convulsions: Secondary | ICD-10-CM | POA: Diagnosis not present

## 2019-04-21 DIAGNOSIS — R7309 Other abnormal glucose: Secondary | ICD-10-CM | POA: Diagnosis not present

## 2019-04-21 DIAGNOSIS — E782 Mixed hyperlipidemia: Secondary | ICD-10-CM | POA: Diagnosis not present

## 2019-04-21 DIAGNOSIS — R569 Unspecified convulsions: Secondary | ICD-10-CM | POA: Diagnosis not present

## 2019-04-21 NOTE — Telephone Encounter (Signed)
I have spoken with his wife this morning.  She is continuing to wean him off Dilantin.  He is finishing up his first week of lamotrigine - currently still taking 25mg , one tab BID.  He increases the dose to two tabs BID on 04/22/2019.  She feels he has been more drowsy since starting the medication.  She also feels the drowsiness is causing him to have a decreased appetite.  No rash reported.  She is willing to try him on the lamotrigine for a little longer in hopes he will feel better.  She will make sure that he keeps his pending appt with our office on 04/29/2019.  She understands to call us anytime with new concerns.

## 2019-04-28 NOTE — Progress Notes (Signed)
PATIENT: Zachary Ali DOB: Apr 06, 1929  REASON FOR VISIT: follow up HISTORY FROM: patient  HISTORY OF PRESENT ILLNESS: Today 04/29/19  HISTORY  09/03/17 [name] 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 No seizures in several years. Denies any falls, using a single-point cane. Patient remains on Dilantin and Depakote. Discussed again stopping Dilantin and adding Keppra but pt is reluctant. No new neurologic complaints.     HISTORY: initial evaluation by Dr Terrace Arabia 08/17/10. He is referred to this clinic, because there was no longer recurrent seizure, and with this worsening gait difficulty, wondering if we can make some adjustment of his epileptiic medications  PMHx.He has past medical history of tachycardia is taking beta blocker, also has a history of seizure, has been on long-term epileptic medications. He began to experience seizures since age 63, initially it was frequent Petit mal seizure, up to 12-16 episodes in a day, he has been treated with combination of Dilantin, and phenobarbital since age 44, without effectively control his petit mal seizure. In addition he also has some infrequent generalized tonic-clonic seizure. In 1976, he was started on Depakote, which has drastically change his seizure, his petit mal seizure was under much better control, while he was on Depakote and phenobarbital combination, he developed a generalized tonic-clonic seizure, Dilantin was reintroduced later, he has been on current dose of 3 medication for 35 years. Dilantin100 mg t.i.d. phenobarbital 100 mg every morning. Depakote500 mg t.i.d.  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 Over the past couple years, he was noticed to have unsteady gait. he denied incontinence, bilateral lower extremity paresthesia. He has successfully weaned off phenobarbital now,  he feels better afterwards, there is no recurrent seizure. He is continue taking depakote  tid, dilantin 100 tid. MRI brain showed mild atrophy, no acute lesions. EEG, had mild background slowing, no epilepsy discharges. he is still on Depakote 500 mg 3 times a day, Dilantin 100 mg 3 times a day, no recurrent seizure, He was admitted to the hospital June 24 2014, for worsening confusion, dysarthria, was diagnosed with UTI, repeat MRI of the brain showed no acute lesions, MRA showed intracranial atherosclerotic disease, he was started on baby aspirin daily, He was discharged home after rehabilitation,overall doing very well,  Depakote level in hospital September 2015, 54.8, Dilantin was 10  UPDATE Sep 03 2017:YY He has fell few time, Oct 19, Aug 29 2017,worsening unsteady gait, no recurrent seizure, he does not want to change his antiepileptic medications, he is taking dilantin  tid, Depakote DR  tid.   Wife also concerned about his ability of driving.  03/24/2019 Dr. Terrace Arabia via VV: History of Present Illness: He lives at home with his wife, overall  He is doing very well, he had no recurrent seizure, tolerating Dilantin ER 100 mg 3 times a day, Depakote 500 mg 3 times a day, and also taking Aricept, Namenda, has slow worsening memory loss  After discussed with patient and his wife, we decided to taper off Dilantin, 100 mg decrements every 2 weeks  Most recent seizure was on January 08 2018,  When he missed his dilantin  Update 04/29/2019 SS: 85 year old male with history of seizures, after last visit in June, decided to taper off Dilantin, continue Depakote 500 mg 3 times daily.  He had a witnessed grand mal seizure 04/13/2019 while tapering down his Dilantin.  He was started on Lamictal.  He had a fall this morning, he says he was walking to the bathroom with his walker and socks. He slipped on the hardwoods. He called for this wife. He was sitting in the floor when she got  there. He did not hit his head. He already had scabs to his hands, that were bleeding. His wife thinks the bruises on his hands were from him hitting the wooden headboard at night. With the Lamictal he is now taking 75 mg twice daily.  Yesterday was his last dose of Dilantin.  His wife feels the Lamictal may be causing drowsiness, decreased appetite.  Prior to today's fall, many months ago.  He has not had recurrent seizure since 04/13/2019.  They have home aids, someone brought him to this appointment.  He has not developed any rash from the Lamictal.  REVIEW OF SYSTEMS: Out of a complete 14 system review of symptoms, the patient complains only of the following symptoms, and all other reviewed systems are negative.  Fall, seizures, drowsiness, decreased appetite  ALLERGIES: No Known Allergies  HOME MEDICATIONS: Outpatient Medications Prior to Visit  Medication Sig Dispense Refill  . acetaminophen (TYLENOL) 325 MG tablet Take 2 tablets (650 mg total) by mouth every 6 (six) hours as needed for mild pain, moderate pain, fever or headache (or Fever >/= 101).    Marland Kitchen aspirin EC 81 MG EC tablet Take 1 tablet (81 mg total) by mouth daily.    Marland Kitchen DILANTIN 100 MG ER capsule TAKE (1) CAPSULE THREE TIMES DAILY. 90 capsule 5  . divalproex (DEPAKOTE) 500 MG DR tablet Take 1 tablet (500 mg total) by mouth 3 (three) times daily. 270 tablet 4  . donepezil (ARICEPT) 10 MG tablet Take 1 tablet (10 mg total) by mouth at bedtime. 90 tablet 4  . finasteride (PROSCAR) 5 MG tablet Take 5 mg by mouth every morning.   2  . furosemide (LASIX) 40 MG tablet Take 40 mg by mouth daily.   10  . lamoTRIgine (LAMICTAL) 100 MG tablet Take 1 tablet (100 mg total) by mouth daily. Please fill lamotrigine 25mg  Rx first, 60 tablet 11  . lamoTRIgine (LAMICTAL) 25 MG tablet Take 1 tablet (25 mg total) by mouth daily. 1 tablet twice a day for the first week 2 tablets twice a day for the second week 3 tablets twice a day for the third week  4 tablets twice a day for the fourth week  For total of 140 tablets  After finish titration with small dose of lamotrigine 25 mg, change to lamotrigine 100 mg twice a day 140 tablet 0  . memantine (NAMENDA) 10 MG tablet Take 1 tablet (10 mg total) by mouth 2 (two) times daily. 180 tablet 4  . metoprolol succinate (TOPROL-XL) 25 MG 24 hr tablet Take 25 mg by mouth every morning.     . potassium chloride (KLOR-CON) 8 MEQ tablet Take 8 mEq by mouth daily.   5   No facility-administered medications prior to visit.     PAST MEDICAL HISTORY: Past Medical History:  Diagnosis Date  . Allergic rhinitis   . BPH (benign prostatic hypertrophy)    seen urology, Dr. Rosana Hoes  . Bronchitis    episode of 2/10  . Cerebral hemorrhage (HCC)    approx 50 years ago  . Dyslipidemia    diet control  . Edema leg    hx of  . Elevated PSA    dr. Rosana Hoes PSA 3-6; no PSA-urology f/u for exam yrly  .  Foley catheter in place   . Insomnia   . OA (osteoarthritis) of knee    left   . Palpitations    w/u with Card-Dr. Anne FuSkains  . Paroxysmal supraventricular tachycardia (HCC)   . Pneumonia    hx of   . Seizure (HCC) 01/08/2018   since childhood, Dr. Debarah CrapeYen; off phenoparb since 2011, after many yr, doing ok  . Small vessel disease (HCC)   . SOB (shortness of breath)    walking distances/climbing stairs  . Stroke [facility](HCC)    hx of CVA  . SVT (supraventricular tachycardia) (HCC)   . Urinary tract infection    hx of     PAST SURGICAL HISTORY: Past Surgical History:  Procedure Laterality Date  . CATARACT EXTRACTION     2011-12/left   . GREEN LIGHT LASER TURP (TRANSURETHRAL RESECTION OF PROSTATE N/A 04/05/2015   Procedure: GREEN LIGHT LASER TURP (TRANSURETHRAL RESECTION OF PROSTATE;  Surgeon: Jerilee FieldMatthew Eskridge, MD;  Location: [facility];  Service: Urology;  Laterality: N/A;  . HEMORRHOID SURGERY    . US ECHOCARDIOGRAPHY     ok; stress test ok-05-2013 nl LVF, tr AR  . VASECTOMY      FAMILY HISTORY: Family History   Problem Relation Age of Onset  . Heart disease Mother   . Coronary artery disease Mother   . Coronary artery disease Father   . Heart disease Father   . Heart failure Unknown     SOCIAL HISTORY: Social History   Socioeconomic History  . Marital status: Married    Spouse name: Hope  . Number of children: 0  . Years of education: College  . Highest education level: Not on file  Occupational History  . Not on file  Social Needs  . Financial resource strain: Not on file  . Food insecurity    Worry: Not on file    Inability: Not on file  . Transportation needs    Medical: Not on file    Non-medical: Not on file  Tobacco Use  . Smoking status: Former Smoker    Packs/day: 2.00    Years: 20.00    Pack years: 40.00    Types: Cigarettes    Quit date: 10/15/1974    Years since quitting: 44.5  . Smokeless tobacco: Never Used  Substance and Sexual Activity  . Alcohol use: Yes    Alcohol/week: 5.0 standard drinks    Types: 5 Glasses of wine per week    Comment: occas   . Drug use: No  . Sexual activity: Not on file  Lifestyle  . Physical activity    Days per week: Not on file    Minutes per session: Not on file  . Stress: Not on file  Relationships  . Social Musicianconnections    Talks on phone: Not on file    Gets together: Not on file    Attends religious service: Not on file    Active member of club or organization: Not on file    Attends meetings of clubs or organizations: Not on file    Relationship status: Not on file  . Intimate partner violence    Fear of current or ex partner: Not on file    Emotionally abused: Not on file    Physically abused: Not on file    Forced sexual activity: Not on file  Other Topics Concern  . Not on file  Social History Narrative   Patient lives at home with wife AshlandHope.    Patient  has no children.    Patient is college graduated.    Patient is retired.    Patient is right handed.          PHYSICAL EXAM  Vitals:   04/29/19 1246   BP: (!) 150/86  Pulse: 71  Temp: (!) 96.6 F (35.9 C)  TempSrc: Oral  Weight: 171 lb (77.6 kg)  Height: 5\' 8"  (1.727 m)   Body mass index is 26 kg/m.  Generalized: Well developed, in no acute distress  No injury or tenderness to his head or cervical spine noted, bruising to posterior hand surface, wrapped Kerlex Neurological examination  Mentation: Alert oriented to time, place, his wife provides most history. Follows all commands speech and language fluent Cranial nerve II-XII: Pupils were equal round reactive to light. Extraocular movements were full, visual field were full on confrontational test. Facial sensation and strength were normal. Uvula tongue midline. Head turning and shoulder shrug  were normal and symmetric. Motor: The motor testing reveals 4 over 5 strength of all 4 extremities. Good symmetric motor tone is noted throughout.  Sensory: Sensory testing is intact to soft touch on all 4 extremities. No evidence of extinction is noted.  Coordination: Difficulty understanding instruction, able to perform hand to nose bilaterally Gait and station: Gait is slow, wide-based, using walker Reflexes: Deep tendon reflexes are symmetric   DIAGNOSTIC DATA (LABS, IMAGING, TESTING) - I reviewed patient records, labs, notes, testing and imaging myself where available.  Lab Results  Component Value Date   WBC 6.8 01/08/2018   HGB 13.8 01/08/2018   HCT 42.4 01/08/2018   MCV 98.1 01/08/2018   PLT 95 (L) 01/08/2018      Component Value Date/Time   NA 139 01/08/2018 1151   NA 139 09/03/2017 1413   K 3.7 01/08/2018 1151   CL 104 01/08/2018 1151   CO2 21 (L) 01/08/2018 1151   GLUCOSE 114 (H) 01/08/2018 1151   BUN 25 (H) 01/08/2018 1151   BUN 31 (H) 09/03/2017 1413   CREATININE 0.88 01/08/2018 1151   CALCIUM 8.9 01/08/2018 1151   PROT 6.6 09/03/2017 1413   ALBUMIN 4.1 09/03/2017 1413   AST 18 09/03/2017 1413   ALT 12 09/03/2017 1413   ALKPHOS 78 09/03/2017 1413   BILITOT 0.4  09/03/2017 1413   GFRNONAA >60 01/08/2018 1151   GFRAA >60 01/08/2018 1151   Lab Results  Component Value Date   CHOL 142 06/26/2014   HDL 55 06/26/2014   LDLCALC 66 06/26/2014   TRIG 106 06/26/2014   CHOLHDL 2.6 06/26/2014   Lab Results  Component Value Date   HGBA1C 5.8 (H) 06/26/2014   Lab Results  Component Value Date   VITAMINB12 709 09/03/2017   Lab Results  Component Value Date   TSH 3.150 09/03/2017    ASSESSMENT AND PLAN 85 y.o. year old male  has a past medical history of Allergic rhinitis, BPH (benign prostatic hypertrophy), Bronchitis, Cerebral hemorrhage (HCC), Dyslipidemia, Edema leg, Elevated PSA, Foley catheter in place, Insomnia, OA (osteoarthritis) of knee, Palpitations, Paroxysmal supraventricular tachycardia (HCC), Pneumonia, Seizure (HCC) (01/08/2018), Small vessel disease (HCC), SOB (shortness of breath), Stroke (HCC), SVT (supraventricular tachycardia) (HCC), and Urinary tract infection. here with:  1.  Epilepsy - Last visit 03/24/2019, decided to taper off Dilantin, continue Depakote 500 mg 3 times a day -04/13/2019 had grand mal seizure witnessed by his wife, Lamictal 25 mg tabs were added -He finished Dilantin yesterday, is now on Lamictal 75 mg twice daily -His wife  feels he may be more drowsy, decreased appetite with the addition of Lamictal -He had a fall this morning, slipped wearing socks on hardwood going to the bathroom, he did not hit his head, he denies seizure, bruising to hands -I will check lab work, including a Depakote and Lamictal level -Depending on lab work, Dr. Terrace ArabiaYan may adjust his seizure medication dosing -He will follow-up in 3 months with Dr. Terrace ArabiaYan or sooner if needed -They will call for recurrent seizure activity   I spent 15 minutes with the patient. 50% of this time was spent discussing his plan of care   Zachary KluverSarah Slack, AGNP-C, DNP 04/29/2019, 1:42 PM [facility] [address], [address] [place], [place] [address]  [phone]

## 2019-04-29 ENCOUNTER — Encounter: Payer: Self-pay | Admitting: Neurology

## 2019-04-29 ENCOUNTER — Other Ambulatory Visit: Payer: Self-pay | Admitting: Neurology

## 2019-04-29 ENCOUNTER — Ambulatory Visit (INDEPENDENT_AMBULATORY_CARE_PROVIDER_SITE_OTHER): Payer: Medicare Other | Admitting: Neurology

## 2019-04-29 ENCOUNTER — Other Ambulatory Visit: Payer: Self-pay

## 2019-04-29 VITALS — BP 150/86 | HR 71 | Temp 96.6°F | Ht 68.0 in | Wt 171.0 lb

## 2019-04-29 DIAGNOSIS — G40909 Epilepsy, unspecified, not intractable, without status epilepticus: Secondary | ICD-10-CM

## 2019-04-29 NOTE — Patient Instructions (Signed)
I will check lab work today. I will call you with the results. We may adjust the dosage of Lamictal and Depakote.

## 2019-04-30 ENCOUNTER — Telehealth: Payer: Self-pay | Admitting: Neurology

## 2019-04-30 DIAGNOSIS — S61419A Laceration without foreign body of unspecified hand, initial encounter: Secondary | ICD-10-CM | POA: Diagnosis not present

## 2019-04-30 DIAGNOSIS — M7989 Other specified soft tissue disorders: Secondary | ICD-10-CM | POA: Diagnosis not present

## 2019-04-30 NOTE — Telephone Encounter (Signed)
I spoke to wife.  I relayed about his slightly elevated WBC count.  He had fallen and has some hand abrasions also some strong smelling urine.  Pt does not like to urinate at pcp.  He has appt with Dr. Deforest Hoyles ([facility]) this afternoon.  They will address.  Will call later with lamotrigine level.

## 2019-04-30 NOTE — Telephone Encounter (Signed)
I called the patient. There was no answer or option to leave message. I was calling to discuss his WBC count was mildly elevated 12.7. Please call to check for any sign of infection and that he is feeling okay? Such as UTI or respiratory infection? His Lamictal level is pending. Once level returns, we will likely cut back his depakote to twice daily vs three times, not right now though.

## 2019-04-30 NOTE — Progress Notes (Signed)
Laboratory evaluation reviewed, Depakote level was 87, lamotrigine level is pending, if lamotrigine level is within normal limit, will decrease the Depakote dose to DR 500 mg twice a day instead of 3 times a day,  Also has elevated WBC, please check with patient to make sure that he does not have any signs of infection such as UTI, upper aspiratory infection

## 2019-05-01 ENCOUNTER — Ambulatory Visit
Admission: RE | Admit: 2019-05-01 | Discharge: 2019-05-01 | Disposition: A | Discharge status: Home / Self Care | Payer: Medicare Other | Source: Ambulatory Visit | Attending: Internal Medicine | Admitting: Internal Medicine

## 2019-05-01 ENCOUNTER — Other Ambulatory Visit: Payer: Self-pay | Admitting: Internal Medicine

## 2019-05-01 DIAGNOSIS — M79641 Pain in right hand: Secondary | ICD-10-CM | POA: Diagnosis not present

## 2019-05-01 DIAGNOSIS — M7989 Other specified soft tissue disorders: Secondary | ICD-10-CM

## 2019-05-01 LAB — VALPROIC ACID LEVEL: Valproic Acid Lvl: 84 ug/mL (ref 50–100)

## 2019-05-01 LAB — CBC WITH DIFFERENTIAL/PLATELET
Basophils Absolute: 0.1 10*3/uL (ref 0.0–0.2)
Basos: 1 %
EOS (ABSOLUTE): 0.1 10*3/uL (ref 0.0–0.4)
Eos: 1 %
Hematocrit: 43.7 % (ref 37.5–51.0)
Hemoglobin: 15 g/dL (ref 13.0–17.7)
Immature Grans (Abs): 0.1 10*3/uL (ref 0.0–0.1)
Immature Granulocytes: 1 %
Lymphocytes Absolute: 1.5 10*3/uL (ref 0.7–3.1)
Lymphs: 11 %
MCH: 33.2 pg — ABNORMAL HIGH (ref 26.6–33.0)
MCHC: 34.3 g/dL (ref 31.5–35.7)
MCV: 97 fL (ref 79–97)
Monocytes Absolute: 1.4 10*3/uL — ABNORMAL HIGH (ref 0.1–0.9)
Monocytes: 11 %
Neutrophils Absolute: 9.6 10*3/uL — ABNORMAL HIGH (ref 1.4–7.0)
Neutrophils: 75 %
Platelets: 141 10*3/uL — ABNORMAL LOW (ref 150–450)
RBC: 4.52 x10E6/uL (ref 4.14–5.80)
RDW: 13.3 % (ref 11.6–15.4)
WBC: 12.7 10*3/uL — ABNORMAL HIGH (ref 3.4–10.8)

## 2019-05-01 LAB — COMPREHENSIVE METABOLIC PANEL
ALT: 15 IU/L (ref 0–44)
AST: 19 IU/L (ref 0–40)
Albumin/Globulin Ratio: 1.9 (ref 1.2–2.2)
Albumin: 4.2 g/dL (ref 3.6–4.6)
Alkaline Phosphatase: 63 IU/L (ref 39–117)
BUN/Creatinine Ratio: 23 (ref 10–24)
BUN: 28 mg/dL — ABNORMAL HIGH (ref 8–27)
Bilirubin Total: 0.6 mg/dL (ref 0.0–1.2)
CO2: 23 mmol/L (ref 20–29)
Calcium: 9.3 mg/dL (ref 8.6–10.2)
Chloride: 97 mmol/L (ref 96–106)
Creatinine, Ser: 1.21 mg/dL (ref 0.76–1.27)
GFR calc Af Amer: 62 mL/min/{1.73_m2} (ref >59)
GFR calc non Af Amer: 54 mL/min/{1.73_m2} — ABNORMAL LOW (ref >59)
Globulin, Total: 2.2 g/dL (ref 1.5–4.5)
Glucose: 116 mg/dL — ABNORMAL HIGH (ref 65–99)
Potassium: 4 mmol/L (ref 3.5–5.2)
Sodium: 141 mmol/L (ref 134–144)
Total Protein: 6.4 g/dL (ref 6.0–8.5)

## 2019-05-01 LAB — LAMOTRIGINE LEVEL: Lamotrigine Lvl: 6.7 ug/mL (ref 2.0–20.0)

## 2019-05-04 ENCOUNTER — Telehealth: Payer: Self-pay | Admitting: Neurology

## 2019-05-04 NOTE — Telephone Encounter (Addendum)
I called and spoke to wife and relayed the result note per SS/NP as per below.  She stated he will start the lamotrigine 100mg  po BID on Wednesday.   She stated that he was acting strange for him this morning.  He got up, went outside w/o his walker, and had to call neighbor to help him get back inside.  He stated that he was going home.  They have lived in that home for a lot of yrs.  Also his had , she had not heard back from pcp about it, but I saw that it was done and let her know no acute abnormality seen in the xray.  He has had no seizures (as far as she knows).  I explained that with dementia (he is on aricept and namenda) this disease is progressive. He may have good days and bad days.  Will require close supervision. She was aware.  This was just different behavior for him.

## 2019-05-04 NOTE — Telephone Encounter (Signed)
Please call the patient. Lamictal level has resulted at 6.7. Continue with titration schedule of Lamictal. Dr. Krista Blue suggests since Depakote level on high end of normal (84), we decrease his dose to 500 mg twice daily vs taking three times daily.

## 2019-05-05 NOTE — Telephone Encounter (Signed)
Ok, agree with close supervision. I see he had a hand xray completed 7/17, looks normal. His WBC was elevated last week. Glad he has had f/u with PCP. She should closely monitor his behavior, as you suggested to her.

## 2019-05-07 ENCOUNTER — Other Ambulatory Visit: Payer: Self-pay | Admitting: *Deleted

## 2019-05-07 DIAGNOSIS — W19XXXA Unspecified fall, initial encounter: Secondary | ICD-10-CM | POA: Diagnosis not present

## 2019-05-07 DIAGNOSIS — M79672 Pain in left foot: Secondary | ICD-10-CM | POA: Diagnosis not present

## 2019-05-07 MED ORDER — DIVALPROEX SODIUM 500 MG PO DR TAB: 500.0000 mg | DELAYED_RELEASE_TABLET | Freq: Two times a day (BID) | ORAL | 0 refills | Status: DC

## 2019-05-07 NOTE — Telephone Encounter (Signed)
Updated CB# [phone]

## 2019-05-07 NOTE — Telephone Encounter (Addendum)
I called and spoke to [facility] Juliann Pulse).  She states that pt has had a deep decline from the last month.  He was seen 04-29-19.  He had fallen that morning and noted that his balance is off, poor eating, (struggles to take meds (applesauce, taking sometimes over 43min).  He is taking depakote 500mg  po bid, lamictal titration (100mg  po bid) now.  He was seen at Dr, Deforest Hoyles about hands when fallen 05-04-19 but xray fine, also did have UA done, we spoke about having this done (to pcp).  Trying to see if correlation of sx to medication changes.  Please advise.

## 2019-05-07 NOTE — Telephone Encounter (Signed)
Leta Speller, RN from [facility] is calling in to discuss Medication Change in pt and AMS  CB# [phone]

## 2019-05-07 NOTE — Telephone Encounter (Signed)
I called patient twice, fail to reach him, please check on patient again Monday, if he still has trouble, let him come back to office to reevaluation.

## 2019-05-10 ENCOUNTER — Encounter (HOSPITAL_COMMUNITY): Payer: Self-pay

## 2019-05-10 ENCOUNTER — Emergency Department (HOSPITAL_COMMUNITY)
Admission: EM | Admit: 2019-05-10 | Discharge: 2019-05-10 | Disposition: A | Discharge status: Home / Self Care | Payer: Medicare Other | Attending: Emergency Medicine | Admitting: Emergency Medicine

## 2019-05-10 ENCOUNTER — Emergency Department (HOSPITAL_COMMUNITY): Payer: Medicare Other

## 2019-05-10 ENCOUNTER — Other Ambulatory Visit: Payer: Self-pay

## 2019-05-10 DIAGNOSIS — Z8673 Personal history of transient ischemic attack (TIA), and cerebral infarction without residual deficits: Secondary | ICD-10-CM | POA: Diagnosis not present

## 2019-05-10 DIAGNOSIS — S299XXA Unspecified injury of thorax, initial encounter: Secondary | ICD-10-CM | POA: Diagnosis not present

## 2019-05-10 DIAGNOSIS — R2689 Other abnormalities of gait and mobility: Secondary | ICD-10-CM | POA: Diagnosis present

## 2019-05-10 DIAGNOSIS — Z79899 Other long term (current) drug therapy: Secondary | ICD-10-CM | POA: Insufficient documentation

## 2019-05-10 DIAGNOSIS — Z7982 Long term (current) use of aspirin: Secondary | ICD-10-CM | POA: Diagnosis not present

## 2019-05-10 DIAGNOSIS — W19XXXA Unspecified fall, initial encounter: Secondary | ICD-10-CM | POA: Diagnosis not present

## 2019-05-10 DIAGNOSIS — Z87891 Personal history of nicotine dependence: Secondary | ICD-10-CM | POA: Insufficient documentation

## 2019-05-10 DIAGNOSIS — R404 Transient alteration of awareness: Secondary | ICD-10-CM | POA: Diagnosis not present

## 2019-05-10 DIAGNOSIS — M79672 Pain in left foot: Secondary | ICD-10-CM | POA: Diagnosis not present

## 2019-05-10 DIAGNOSIS — S0990XA Unspecified injury of head, initial encounter: Secondary | ICD-10-CM | POA: Diagnosis not present

## 2019-05-10 DIAGNOSIS — N3001 Acute cystitis with hematuria: Secondary | ICD-10-CM | POA: Insufficient documentation

## 2019-05-10 DIAGNOSIS — R300 Dysuria: Secondary | ICD-10-CM | POA: Insufficient documentation

## 2019-05-10 DIAGNOSIS — M25562 Pain in left knee: Secondary | ICD-10-CM | POA: Diagnosis not present

## 2019-05-10 DIAGNOSIS — R4020 Unspecified coma: Secondary | ICD-10-CM | POA: Diagnosis not present

## 2019-05-10 DIAGNOSIS — R262 Difficulty in walking, not elsewhere classified: Secondary | ICD-10-CM

## 2019-05-10 DIAGNOSIS — R52 Pain, unspecified: Secondary | ICD-10-CM | POA: Diagnosis not present

## 2019-05-10 DIAGNOSIS — S79912A Unspecified injury of left hip, initial encounter: Secondary | ICD-10-CM | POA: Diagnosis not present

## 2019-05-10 DIAGNOSIS — M79605 Pain in left leg: Secondary | ICD-10-CM | POA: Diagnosis not present

## 2019-05-10 DIAGNOSIS — S8992XA Unspecified injury of left lower leg, initial encounter: Secondary | ICD-10-CM | POA: Diagnosis not present

## 2019-05-10 LAB — CBC WITH DIFFERENTIAL/PLATELET
Abs Immature Granulocytes: 0.1 10*3/uL — ABNORMAL HIGH (ref 0.00–0.07)
Basophils Absolute: 0.1 10*3/uL (ref 0.0–0.1)
Basophils Relative: 1 %
Eosinophils Absolute: 0.1 10*3/uL (ref 0.0–0.5)
Eosinophils Relative: 1 %
HCT: 42.8 % (ref 39.0–52.0)
Hemoglobin: 13.5 g/dL (ref 13.0–17.0)
Immature Granulocytes: 1 %
Lymphocytes Relative: 11 %
Lymphs Abs: 1.1 10*3/uL (ref 0.7–4.0)
MCH: 32.2 pg (ref 26.0–34.0)
MCHC: 31.5 g/dL (ref 30.0–36.0)
MCV: 102.1 fL — ABNORMAL HIGH (ref 80.0–100.0)
Monocytes Absolute: 1.1 10*3/uL — ABNORMAL HIGH (ref 0.1–1.0)
Monocytes Relative: 11 %
Neutro Abs: 7.4 10*3/uL (ref 1.7–7.7)
Neutrophils Relative %: 75 %
Platelets: 89 10*3/uL — ABNORMAL LOW (ref 150–400)
RBC: 4.19 MIL/uL — ABNORMAL LOW (ref 4.22–5.81)
RDW: 14.1 % (ref 11.5–15.5)
WBC: 10 10*3/uL (ref 4.0–10.5)
nRBC: 0 % (ref 0.0–0.2)

## 2019-05-10 LAB — COMPREHENSIVE METABOLIC PANEL
ALT: 19 U/L (ref 0–44)
AST: 27 U/L (ref 15–41)
Albumin: 3.6 g/dL (ref 3.5–5.0)
Alkaline Phosphatase: 57 U/L (ref 38–126)
Anion gap: 10 (ref 5–15)
BUN: 21 mg/dL (ref 8–23)
CO2: 29 mmol/L (ref 22–32)
Calcium: 8.9 mg/dL (ref 8.9–10.3)
Chloride: 100 mmol/L (ref 98–111)
Creatinine, Ser: 1.16 mg/dL (ref 0.61–1.24)
GFR calc Af Amer: >60 mL/min (ref >60)
GFR calc non Af Amer: 56 mL/min — ABNORMAL LOW (ref >60)
Glucose, Bld: 110 mg/dL — ABNORMAL HIGH (ref 70–99)
Potassium: 3.2 mmol/L — ABNORMAL LOW (ref 3.5–5.1)
Sodium: 139 mmol/L (ref 135–145)
Total Bilirubin: 1 mg/dL (ref 0.3–1.2)
Total Protein: 6.5 g/dL (ref 6.5–8.1)

## 2019-05-10 LAB — URINALYSIS, ROUTINE W REFLEX MICROSCOPIC
Bilirubin Urine: NEGATIVE
Glucose, UA: NEGATIVE mg/dL
Ketones, ur: 5 mg/dL — AB
Nitrite: NEGATIVE
Protein, ur: NEGATIVE mg/dL
Specific Gravity, Urine: 1.011 (ref 1.005–1.030)
pH: 6 (ref 5.0–8.0)

## 2019-05-10 LAB — PHENYTOIN LEVEL, TOTAL: Phenytoin Lvl: <2.5 ug/mL — ABNORMAL LOW (ref 10.0–20.0)

## 2019-05-10 LAB — CBG MONITORING, ED: Glucose-Capillary: 114 mg/dL — ABNORMAL HIGH (ref 70–99)

## 2019-05-10 LAB — VALPROIC ACID LEVEL: Valproic Acid Lvl: 43 ug/mL — ABNORMAL LOW (ref 50.0–100.0)

## 2019-05-10 LAB — TROPONIN I (HIGH SENSITIVITY): Troponin I (High Sensitivity): 7 ng/L (ref <18)

## 2019-05-10 MED ORDER — SODIUM CHLORIDE 0.9 % IV BOLUS
500.0000 mL | Freq: Once | INTRAVENOUS | Status: AC
  Administered 2019-05-10: 500 mL via INTRAVENOUS

## 2019-05-10 MED ORDER — CEPHALEXIN 500 MG PO CAPS: 500.0000 mg | ORAL_CAPSULE | Freq: Four times a day (QID) | ORAL | 0 refills | Status: DC

## 2019-05-10 MED ORDER — SODIUM CHLORIDE 0.9 % IV SOLN
1.0000 g | Freq: Once | INTRAVENOUS | Status: AC
  Administered 2019-05-10: 1 g via INTRAVENOUS
  Filled 2019-05-10: qty 10

## 2019-05-10 NOTE — ED Notes (Signed)
Patient transported to X-ray 

## 2019-05-10 NOTE — ED Provider Notes (Signed)
[facility]-EMERGENCY DEPT Provider Note   CSN: [id number] Arrival date & time: 05/10/19  1244    History   Chief Complaint No chief complaint on file.   HPI Zachary Ali is a 85 y.o. male.     Pt presents to the ED today with a fall.  Per EMS, pt has been having frequent falls over the past month.  He has a hx of seizures and was weaned off dilantin and put on lamictal.  He is also on depakote.  The pt is unable to give me much history, but does not think he passed out today.  No seizure per EMS.  Pt said his legs gave out and he fell on his left knee.  Pt denies hitting his head.  He is not on blood thinners.  He has not had a fever.  No sob or cough.  More history obtained by the caregiver.  His wife fell a few days ago and has been admitted upstairs.  Aides have been staying with pt while she's been upstairs.  He fell a few days ago and has been limping since then.  The aide initially took him to the urgent care 2 days ago.  They xrayed his left foot which was nl.  Pt is still limping and c/o it hurting when he urinates, so she called EMS to take him here today.  He did not fall today.     Past Medical History:  Diagnosis Date  . Allergic rhinitis   . BPH (benign prostatic hypertrophy)    seen urology, Dr. Earlene PlaterAVIS  . Bronchitis    episode of 2/10  . Cerebral hemorrhage (HCC)    approx 50 years ago  . Dyslipidemia    diet control  . Edema leg    hx of  . Elevated PSA    dr. Earlene Platerdavis PSA 3-6; no PSA-urology f/u for exam yrly  . Foley catheter in place   . Insomnia   . OA (osteoarthritis) of knee    left   . Palpitations    w/u with Card-Dr. Anne FuSkains  . Paroxysmal supraventricular tachycardia (HCC)   . Pneumonia    hx of   . Seizure (HCC) 01/08/2018   since childhood, Dr. Debarah CrapeYen; off phenoparb since 2011, after many yr, doing ok  . Small vessel disease (HCC)   . SOB (shortness of breath)    walking distances/climbing stairs  . Stroke [facility](HCC)    hx of  CVA  . SVT (supraventricular tachycardia) (HCC)   . Urinary tract infection    hx of     Patient Active Problem List   Diagnosis Date Noted  . Memory loss 09/17/2018  . Mild cognitive impairment 09/03/2017  . Gait abnormality 09/03/2017  . Seizure disorder (HCC) 08/28/2016  . UTI (urinary tract infection) 12/08/2014  . Acute renal failure (HCC) 12/08/2014  . Urinary retention 12/08/2014  . Thrombocytopenia (HCC) 12/08/2014  . Anemia 12/08/2014  . Lung nodule 12/08/2014  . Fall at home 12/08/2014  . Diarrhea 12/08/2014  . UTI (lower urinary tract infection) 12/08/2014  . Altered mental state 06/25/2014  . CVA (cerebral infarction) 06/25/2014  . 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 Benign prostate hyperplasia 03/03/2012  . Elevated prostate specific antigen (PSA) 09/03/2011  . Urinary urgency 09/03/2011    Past Surgical History:  Procedure Laterality Date  . CATARACT EXTRACTION     2011-12/left   . GREEN LIGHT LASER TURP (TRANSURETHRAL RESECTION OF PROSTATE N/A 04/05/2015  Procedure: GREEN LIGHT LASER TURP (TRANSURETHRAL RESECTION OF PROSTATE;  Surgeon: Jerilee Field, MD;  Location: [facility] ORS;  Service: Urology;  Laterality: N/A;  . HEMORRHOID SURGERY    . US ECHOCARDIOGRAPHY     ok; stress test ok-05-2013 nl LVF, tr AR  . VASECTOMY          Home Medications    Prior to Admission medications   Medication Sig Start Date End Date Taking? Authorizing Provider  acetaminophen (TYLENOL) 325 MG tablet Take 2 tablets (650 mg total) by mouth every 6 (six) hours as needed for mild pain, moderate pain, fever or headache (or Fever >/= 101). 12/11/14   Hongalgi, Maximino Greenland, MD  aspirin EC 81 MG EC tablet Take 1 tablet (81 mg total) by mouth daily. 06/27/14   Mikhail, Maryann, DO  DILANTIN 100 MG ER capsule TAKE (1) CAPSULE THREE TIMES DAILY. Patient taking differently: Take 100 mg by mouth 3 (three) times daily.  01/13/19   Levert Feinstein, MD  divalproex (DEPAKOTE) 500 MG  DR tablet Take 1 tablet (500 mg total) by mouth 2 (two) times daily. 05/07/19   Levert Feinstein, MD  donepezil (ARICEPT) 10 MG tablet Take 1 tablet (10 mg total) by mouth at bedtime. 03/24/19   Levert Feinstein, MD  finasteride (PROSCAR) 5 MG tablet Take 5 mg by mouth every morning.  08/09/14   [provider]  furosemide (LASIX) 40 MG tablet Take 40 mg by mouth daily.  08/15/15   [provider]  lamoTRIgine (LAMICTAL) 100 MG tablet Take 1 tablet (100 mg total) by mouth daily. Please fill lamotrigine  Rx first, 04/13/19   Levert Feinstein, MD  lamoTRIgine (LAMICTAL) 25 MG tablet Take 1 tablet (25 mg total) by mouth daily. 1 tablet twice a day for the first week 2 tablets twice a day for the second week 3 tablets twice a day for the third week 4 tablets twice a day for the fourth week  For total of 140 tablets  After finish titration with small dose of lamotrigine 25 mg, change to lamotrigine 100 mg twice a day 04/13/19   Levert Feinstein, MD  memantine (NAMENDA) 10 MG tablet Take 1 tablet (10 mg total) by mouth 2 (two) times daily. 03/24/19   Levert Feinstein, MD  metoprolol succinate (TOPROL-XL) 25 MG 24 hr tablet Take 25 mg by mouth every morning.     [provider]  potassium chloride (KLOR-CON) 8 MEQ tablet Take 8 mEq by mouth daily.  11/22/14   [provider]    Family History Family History  Problem Relation Age of Onset  . Heart disease Mother   . Coronary artery disease Mother   . Coronary artery disease Father   . Heart disease Father   . Heart failure Other     Social History Social History   Tobacco Use  . Smoking status: Former Smoker    Packs/day: 2.00    Years: 20.00    Pack years: 40.00    Types: Cigarettes    Quit date: 10/15/1974    Years since quitting: 44.5  . Smokeless tobacco: Never Used  Substance Use Topics  . Alcohol use: Yes    Alcohol/week: 5.0 standard drinks    Types: 5 Glasses of wine per week    Comment: occas   . Drug use: No      Allergies   Patient has no known allergies.   Review of Systems Review of Systems  Musculoskeletal:  Left knee pain  Neurological:       Frequent falls  All other systems reviewed and are negative.    Physical Exam Updated Vital Signs BP (!) 121/52 (BP Location: Left Arm)   Pulse 67   Temp 97.6 F (36.4 C) (Oral)   Resp 16   SpO2 94%   Physical Exam Vitals signs and nursing note reviewed.  Constitutional:      Appearance: Normal appearance.  HENT:     Head: Normocephalic and atraumatic.     Right Ear: External ear normal.     Left Ear: External ear normal.     Nose: Nose normal.     Mouth/Throat:     Mouth: Mucous membranes are moist.     Pharynx: Oropharynx is clear.  Eyes:     Extraocular Movements: Extraocular movements intact.     Conjunctiva/sclera: Conjunctivae normal.     Pupils: Pupils are equal, round, and reactive to light.  Neck:     Musculoskeletal: Normal range of motion and neck supple.  Cardiovascular:     Rate and Rhythm: Normal rate and regular rhythm.     Pulses: Normal pulses.     Heart sounds: Normal heart sounds.  Pulmonary:     Effort: Pulmonary effort is normal.     Breath sounds: Normal breath sounds.  Abdominal:     General: Abdomen is flat. Bowel sounds are normal.     Palpations: Abdomen is soft.  Musculoskeletal: Normal range of motion.  Skin:    General: Skin is warm.     Capillary Refill: Capillary refill takes less than 2 seconds.  Neurological:     Mental Status: He is alert. He is disoriented.     Comments: Pt is moving all 4 extremities.  He is disoriented, but is on Aricept.      ED Treatments / Results  Labs (all labs ordered are listed, but only abnormal results are displayed) Labs Reviewed  CBC WITH DIFFERENTIAL/PLATELET - Abnormal; Notable for the following components:      Result Value   RBC 4.19 (*)    MCV 102.1 (*)    Platelets 89 (*)    Monocytes Absolute 1.1 (*)    Abs Immature Granulocytes 0.10  (*)    All other components within normal limits  COMPREHENSIVE METABOLIC PANEL - Abnormal; Notable for the following components:   Potassium 3.2 (*)    Glucose, Bld 110 (*)    GFR calc non Af Amer 56 (*)    All other components within normal limits  URINALYSIS, ROUTINE W REFLEX MICROSCOPIC - Abnormal; Notable for the following components:   APPearance HAZY (*)    Hgb urine dipstick MODERATE (*)    Ketones, ur 5 (*)    Leukocytes,Ua SMALL (*)    Bacteria, UA FEW (*)    All other components within normal limits  VALPROIC ACID LEVEL - Abnormal; Notable for the following components:   Valproic Acid Lvl 43 (*)    All other components within normal limits  PHENYTOIN LEVEL, TOTAL - Abnormal; Notable for the following components:   Phenytoin Lvl <2.5 (*)    All other components within normal limits  CBG MONITORING, ED - Abnormal; Notable for the following components:   Glucose-Capillary 114 (*)    All other components within normal limits  URINE CULTURE  TROPONIN I (HIGH SENSITIVITY)  TROPONIN I (HIGH SENSITIVITY)    EKG None  Radiology Dg Chest 2 View  Result Date: 05/10/2019 CLINICAL DATA:  Pt from home. Homehealth states pt has been falling more at home. Pt c/o of left medial knee pain. EXAM: CHEST - 2 VIEW COMPARISON:  03/30/2015 FINDINGS: Shallow lung inflation. Heart is mildly enlarged. There is atherosclerotic calcification of the thoracic aorta. Lungs are clear. No pulmonary edema. IMPRESSION: 1. Shallow inflation. 2. Mild cardiomegaly. Electronically Signed   By: Norva PavlovElizabeth  Brown M.D.   On: 05/10/2019 13:35   Ct Head Wo Contrast  Result Date: 05/10/2019 CLINICAL DATA:  Altered level of consciousness. Multiple recent falls. EXAM: CT HEAD WITHOUT CONTRAST TECHNIQUE: Contiguous axial images were obtained from the base of the skull through the vertex without intravenous contrast. COMPARISON:  CT scan dated 01/08/2018 FINDINGS: Brain: No evidence of acute infarction, hemorrhage,  hydrocephalus, extra-axial collection or mass lesion/mass effect. There is diffuse chronic cerebral cortical and cerebellar atrophy with secondary ventricular prominence, stable. Vascular: No hyperdense vessel or unexpected calcification. Skull: Normal. Negative for fracture or focal lesion. Sinuses/Orbits: Normal. Other: None IMPRESSION: No acute abnormalities. Stable diffuse atrophy. Electronically Signed   By: Francene BoyersJames  Maxwell M.D.   On: 05/10/2019 13:49   Dg Knee Complete 4 Views Left  Result Date: 05/10/2019 CLINICAL DATA:  Left knee pain. Recent falls. Altered level of consciousness. EXAM: LEFT KNEE - COMPLETE 4+ VIEW COMPARISON:  None. FINDINGS: No evidence of fracture, dislocation, or joint effusion. Subtle chondrocalcinosis. Soft tissues are unremarkable. IMPRESSION: No acute abnormality. Electronically Signed   By: Francene BoyersJames  Maxwell M.D.   On: 05/10/2019 13:36   Dg Hip Unilat W Or Wo Pelvis 2-3 Views Left  Result Date: 05/10/2019 CLINICAL DATA:  Pain secondary to multiple recent falls. EXAM: DG HIP (WITH OR WITHOUT PELVIS) 2-3V LEFT COMPARISON:  None. FINDINGS: There is no evidence of hip fracture or dislocation. There is no evidence of arthropathy or other focal bone abnormality. IMPRESSION: Normal exam. Electronically Signed   By: Francene BoyersJames  Maxwell M.D.   On: 05/10/2019 13:54    Procedures Procedures (including critical care time)  Medications Ordered in ED Medications  sodium chloride 0.9 % bolus 500 mL (has no administration in time range)  cefTRIAXone (ROCEPHIN) 1 g in sodium chloride 0.9 % 100 mL IVPB (has no administration in time range)     Initial Impression / Assessment and Plan / ED Course  I have reviewed the triage vital signs and the nursing notes.  Pertinent labs & imaging results that were available during my care of the patient were reviewed by me and considered in my medical decision making (see chart for details).    Labs and Xrays reviewed.  Nothing fractured.   Pt  does have a UTI, so he will be treated with rocephin in the ED and d/c home on keflex.  The pt's sx have gotten worse since he was changed to lamictal.  I am not sure if that is a coincidence or if the lamictal is causing some of his sx.  Pt to f/u with neurology to talk about meds for seizures.   Pt has an aide 24 hrs while the wife is in the hospital.  The aide said they wife has not wanted him to go to a facility.  Aide said they have had PT out several times, but it has not helped.    Pt stable for d/c.  Return if worse.  Final Clinical Impressions(s) / ED Diagnoses   Final diagnoses:  Acute cystitis with hematuria  Ambulatory dysfunction    ED Discharge Orders    None  Isla Pence, MD 05/10/19 1535

## 2019-05-10 NOTE — ED Triage Notes (Signed)
Per EMS: Pt from home.  Homehealth states pt has been falling more at home.  Pt c/o of L knee pain. pt has possible UTI

## 2019-05-11 NOTE — Telephone Encounter (Signed)
I called and spoke to [facility], caregiver for pt.  He went to ED yesterday has acute cystitis with hematuria.  Being treated at this time with oral ABX.  Wife had fall on Saturday and broke collarbone/ hurt legs and hoping to be home today with in home rehab.   I relayed that Dr. Krista Blue attemtped to call twice and was not able to reach them 4 days ago.  I relayed that if he continues with problems (relating to balance, appetite (not his usual self) after treated for UTI) then to give [place] a call back.  She verbalized understanding.

## 2019-05-12 DIAGNOSIS — N390 Urinary tract infection, site not specified: Secondary | ICD-10-CM | POA: Diagnosis not present

## 2019-05-12 DIAGNOSIS — M79642 Pain in left hand: Secondary | ICD-10-CM | POA: Diagnosis not present

## 2019-05-12 DIAGNOSIS — G9341 Metabolic encephalopathy: Secondary | ICD-10-CM | POA: Diagnosis not present

## 2019-05-12 LAB — URINE CULTURE: Culture: >=100000 — AB

## 2019-05-13 ENCOUNTER — Telehealth: Payer: Self-pay | Admitting: *Deleted

## 2019-05-13 NOTE — Progress Notes (Signed)
ED Antimicrobial Stewardship Positive Culture Follow Up   Zachary Ali is an 85 y.o. male who presented to [facility] on 05/10/2019 s/p fall with c/o knee pain.  Recent Results (from the past 720 hour(s))  Urine culture     Status: Abnormal   Collection Time: 05/10/19 12:58 PM   Specimen: Urine, Clean Catch  Result Value Ref Range Status   Specimen Description   Final    URINE, CLEAN CATCH Performed at [facility], [place] [address], [place], [place] [address]    Special Requests   Final    NONE Performed at [facility], [place] [address] [place], [place] [address]    Culture (A)  Final    >=100,000 COLONIES/mL AEROCOCCUS VIRIDANS Standardized susceptibility testing for this organism is not available. Performed at [facility], [place] [address] [place], [place] [address]    Report Status 05/12/2019 FINAL  Final    [x]  Treated with keflex, organism resistant to prescribed antimicrobial []  Patient discharged originally without antimicrobial agent and treatment is now indicated  Plan: 1)  d/c cephalexin 2) New antibiotic prescription: amoxicillin 500 mg PO TID for 7 days  ED Provider: Krystal Clark, PA-C   Dia Sitter P 05/13/2019, 10:58 AM Clinical Pharmacist [phone]

## 2019-05-13 NOTE — Telephone Encounter (Signed)
Post ED Visit - Positive Culture Follow-up: Successful Patient Follow-Up  Culture assessed and recommendations reviewed by:  []  Elenor Quinones, Pharm.D. []  Heide Guile, Pharm.D., BCPS AQ-ID []  Parks Neptune, Pharm.D., BCPS []  Alycia Rossetti, Pharm.D., BCPS []  [place], Pharm.D., BCPS, AAHIVP []  Legrand Como, Pharm.D., BCPS, AAHIVP []  Salome Arnt, PharmD, BCPS []  Johnnette Gourd, PharmD, BCPS []  Hughes Better, PharmD, BCPS []  Leeroy Cha, PharmD Dia Sitter, PharmD  Positive urine culture  []  Patient discharged without antimicrobial prescription and treatment is now indicated [x]  Organism is resistant to prescribed ED discharge antimicrobial []  Patient with positive blood cultures  Changes discussed with ED provider Elwyn Reach, PA New antibiotic prescription Amoxicillin 500mg  PO TID x 7 days Called to [facility], [phone]  Contacted patient, date 05/10/2019, time [place], [place] 05/13/2019, 5:24 PM

## 2019-05-27 DIAGNOSIS — R300 Dysuria: Secondary | ICD-10-CM | POA: Diagnosis not present

## 2019-06-01 DIAGNOSIS — N390 Urinary tract infection, site not specified: Secondary | ICD-10-CM | POA: Diagnosis not present

## 2019-06-01 DIAGNOSIS — F99 Mental disorder, not otherwise specified: Secondary | ICD-10-CM | POA: Diagnosis not present

## 2019-06-01 DIAGNOSIS — F0390 Unspecified dementia without behavioral disturbance: Secondary | ICD-10-CM | POA: Diagnosis not present

## 2019-06-01 DIAGNOSIS — M25569 Pain in unspecified knee: Secondary | ICD-10-CM | POA: Diagnosis not present

## 2019-06-09 ENCOUNTER — Encounter: Payer: Self-pay | Admitting: Sports Medicine

## 2019-06-09 ENCOUNTER — Ambulatory Visit (INDEPENDENT_AMBULATORY_CARE_PROVIDER_SITE_OTHER): Payer: Medicare Other | Admitting: Sports Medicine

## 2019-06-09 ENCOUNTER — Other Ambulatory Visit: Payer: Self-pay

## 2019-06-09 VITALS — Temp 97.5°F

## 2019-06-09 DIAGNOSIS — M79674 Pain in right toe(s): Secondary | ICD-10-CM

## 2019-06-09 DIAGNOSIS — G3184 Mild cognitive impairment, so stated: Secondary | ICD-10-CM

## 2019-06-09 DIAGNOSIS — M79675 Pain in left toe(s): Secondary | ICD-10-CM

## 2019-06-09 DIAGNOSIS — B351 Tinea unguium: Secondary | ICD-10-CM | POA: Diagnosis not present

## 2019-06-09 DIAGNOSIS — I739 Peripheral vascular disease, unspecified: Secondary | ICD-10-CM

## 2019-06-09 NOTE — Progress Notes (Signed)
Subjective: Zachary Ali is a 85 y.o. male patient seen today in office with complaint of mildly painful thickened and elongated toenails; unable to trim. Patient is assisted by home health aide who reports that his dementia is getting worse and that wife is in the hospital after a fall. No other issues noted.    Patient Active Problem List   Diagnosis Date Noted  . Memory loss 09/17/2018  . Mild cognitive impairment 09/03/2017  . Gait abnormality 09/03/2017  . Seizure disorder ([place]) 08/28/2016  . UTI (urinary tract infection) 12/08/2014  . Acute renal failure ([place]) 12/08/2014  . Urinary retention 12/08/2014  . Thrombocytopenia ([place]) 12/08/2014  . Anemia 12/08/2014  . Lung nodule 12/08/2014  . Fall at home 12/08/2014  . Diarrhea 12/08/2014  . UTI (lower urinary tract infection) 12/08/2014  . Altered mental state 06/25/2014  . CVA (cerebral infarction) 06/25/2014  . Generalized nonconvulsive epilepsy ([place]) 08/25/2013  . Benign prostate hyperplasia 03/03/2012  . Elevated prostate specific antigen (PSA) 09/03/2011  . Urinary urgency 09/03/2011    Current Outpatient Medications on File Prior to Visit  Medication Sig Dispense Refill  . acetaminophen (TYLENOL) 325 MG tablet Take 2 tablets (650 mg total) by mouth every 6 (six) hours as needed for mild pain, moderate pain, fever or headache (or Fever >/= 101).    Marland Kitchen amoxicillin-clavulanate (AUGMENTIN) 400-57 MG/5ML suspension     . aspirin EC 81 MG EC tablet Take 1 tablet (81 mg total) by mouth daily.    Marland Kitchen DILANTIN 100 MG ER capsule TAKE (1) CAPSULE THREE TIMES DAILY. (Patient taking differently: Take 100 mg by mouth 3 (three) times daily. ) 90 capsule 5  . divalproex (DEPAKOTE) 500 MG DR tablet Take 1 tablet (500 mg total) by mouth 2 (two) times daily. 180 tablet 0  . donepezil (ARICEPT) 10 MG tablet Take 1 tablet (10 mg total) by mouth at bedtime. 90 tablet 4  . finasteride (PROSCAR) 5 MG tablet Take 5 mg by mouth every morning.   2   . furosemide (LASIX) 40 MG tablet Take 40 mg by mouth daily.   10  . lamoTRIgine (LAMICTAL) 100 MG tablet Take 1 tablet (100 mg total) by mouth daily. Please fill lamotrigine 25mg  Rx first, 60 tablet 11  . lamoTRIgine (LAMICTAL) 25 MG tablet Take 1 tablet (25 mg total) by mouth daily. 1 tablet twice a day for the first week 2 tablets twice a day for the second week 3 tablets twice a day for the third week 4 tablets twice a day for the fourth week  For total of 140 tablets  After finish titration with small dose of lamotrigine 25 mg, change to lamotrigine 100 mg twice a day 140 tablet 0  . memantine (NAMENDA) 10 MG tablet Take 1 tablet (10 mg total) by mouth 2 (two) times daily. 180 tablet 4  . metoprolol succinate (TOPROL-XL) 25 MG 24 hr tablet Take 25 mg by mouth every morning.     . potassium chloride (KLOR-CON) 8 MEQ tablet Take 8 mEq by mouth daily.   5   No current facility-administered medications on file prior to visit.     No Known Allergies  Objective: Physical Exam  General: Well developed, nourished, no acute distress, awake, alert and oriented x 3  Vascular: Dorsalis pedis artery 1/4 bilateral, Posterior tibial artery 0/4 bilateral, skin temperature warm to warm proximal to distal bilateral lower extremities, ++varicosities and +1 pitting edema bilateral, no pedal hair present bilateral.  Neurological:  Gross sensation present via light touch bilateral.   Dermatological: Skin is warm, dry, and supple bilateral, Nails 1-10 are tender, long, thick, and discolored with mild subungal debris and dry blood, right 5th toenail well healed minimal nail regrowth, no callus/corns/hyperkeratotic tissue present bilateral. No signs of infection bilateral.  Musculoskeletal: No symptomatic boney deformities noted bilateral. Muscular strength within normal limits without painon range of motion. No pain with calf compression bilateral.  Assessment and Plan:  Problem List Items Addressed  This Visit      Other   Mild cognitive impairment    Other Visit Diagnoses    Pain due to onychomycosis of toenails of both feet    -  Primary   PVD (peripheral vascular disease) (HCC)          -Examined patient.  -Discussed treatment options for painful mycotic nails. -Mechanically debrided and reduced mycotic nails with sterile nail nipper and dremel nail file without incident. -Patient to return in 3 months for follow up evaluation/nail trim or sooner if symptoms worsen.  Asencion Islamitorya Kieley Akter, DPM

## 2019-06-26 DIAGNOSIS — N390 Urinary tract infection, site not specified: Secondary | ICD-10-CM | POA: Diagnosis not present

## 2019-07-01 DIAGNOSIS — N390 Urinary tract infection, site not specified: Secondary | ICD-10-CM | POA: Diagnosis not present

## 2019-07-01 DIAGNOSIS — E860 Dehydration: Secondary | ICD-10-CM | POA: Diagnosis not present

## 2019-07-06 ENCOUNTER — Encounter (HOSPITAL_COMMUNITY): Payer: Self-pay | Admitting: Emergency Medicine

## 2019-07-06 ENCOUNTER — Inpatient Hospital Stay (HOSPITAL_COMMUNITY): Payer: Medicare Other

## 2019-07-06 ENCOUNTER — Inpatient Hospital Stay (HOSPITAL_COMMUNITY)
Admission: EM | Admit: 2019-07-06 | Discharge: 2019-07-10 | DRG: 871 | Disposition: A | Discharge status: Skilled Nursing Facility | Payer: Medicare Other | Attending: Internal Medicine | Admitting: Internal Medicine

## 2019-07-06 ENCOUNTER — Other Ambulatory Visit: Payer: Self-pay

## 2019-07-06 DIAGNOSIS — F0390 Unspecified dementia without behavioral disturbance: Secondary | ICD-10-CM | POA: Diagnosis present

## 2019-07-06 DIAGNOSIS — G9341 Metabolic encephalopathy: Secondary | ICD-10-CM | POA: Diagnosis present

## 2019-07-06 DIAGNOSIS — N3000 Acute cystitis without hematuria: Secondary | ICD-10-CM | POA: Diagnosis not present

## 2019-07-06 DIAGNOSIS — Z8673 Personal history of transient ischemic attack (TIA), and cerebral infarction without residual deficits: Secondary | ICD-10-CM

## 2019-07-06 DIAGNOSIS — Z8249 Family history of ischemic heart disease and other diseases of the circulatory system: Secondary | ICD-10-CM

## 2019-07-06 DIAGNOSIS — Z87440 Personal history of urinary (tract) infections: Secondary | ICD-10-CM | POA: Diagnosis not present

## 2019-07-06 DIAGNOSIS — D649 Anemia, unspecified: Secondary | ICD-10-CM | POA: Diagnosis not present

## 2019-07-06 DIAGNOSIS — Z66 Do not resuscitate: Secondary | ICD-10-CM | POA: Diagnosis present

## 2019-07-06 DIAGNOSIS — Z9079 Acquired absence of other genital organ(s): Secondary | ICD-10-CM

## 2019-07-06 DIAGNOSIS — M6281 Muscle weakness (generalized): Secondary | ICD-10-CM | POA: Diagnosis not present

## 2019-07-06 DIAGNOSIS — A419 Sepsis, unspecified organism: Secondary | ICD-10-CM | POA: Diagnosis not present

## 2019-07-06 DIAGNOSIS — N179 Acute kidney failure, unspecified: Secondary | ICD-10-CM | POA: Diagnosis not present

## 2019-07-06 DIAGNOSIS — R4182 Altered mental status, unspecified: Secondary | ICD-10-CM

## 2019-07-06 DIAGNOSIS — Z20828 Contact with and (suspected) exposure to other viral communicable diseases: Secondary | ICD-10-CM | POA: Diagnosis not present

## 2019-07-06 DIAGNOSIS — R112 Nausea with vomiting, unspecified: Secondary | ICD-10-CM | POA: Diagnosis not present

## 2019-07-06 DIAGNOSIS — R109 Unspecified abdominal pain: Secondary | ICD-10-CM | POA: Diagnosis not present

## 2019-07-06 DIAGNOSIS — R1111 Vomiting without nausea: Secondary | ICD-10-CM | POA: Diagnosis not present

## 2019-07-06 DIAGNOSIS — R41841 Cognitive communication deficit: Secondary | ICD-10-CM | POA: Diagnosis not present

## 2019-07-06 DIAGNOSIS — R404 Transient alteration of awareness: Secondary | ICD-10-CM | POA: Diagnosis not present

## 2019-07-06 DIAGNOSIS — R1032 Left lower quadrant pain: Secondary | ICD-10-CM | POA: Diagnosis not present

## 2019-07-06 DIAGNOSIS — R110 Nausea: Secondary | ICD-10-CM | POA: Diagnosis not present

## 2019-07-06 DIAGNOSIS — N401 Enlarged prostate with lower urinary tract symptoms: Secondary | ICD-10-CM | POA: Diagnosis not present

## 2019-07-06 DIAGNOSIS — Z87891 Personal history of nicotine dependence: Secondary | ICD-10-CM | POA: Diagnosis not present

## 2019-07-06 DIAGNOSIS — N390 Urinary tract infection, site not specified: Secondary | ICD-10-CM | POA: Diagnosis not present

## 2019-07-06 DIAGNOSIS — R278 Other lack of coordination: Secondary | ICD-10-CM | POA: Diagnosis not present

## 2019-07-06 DIAGNOSIS — R1312 Dysphagia, oropharyngeal phase: Secondary | ICD-10-CM | POA: Diagnosis not present

## 2019-07-06 DIAGNOSIS — G40909 Epilepsy, unspecified, not intractable, without status epilepticus: Secondary | ICD-10-CM | POA: Diagnosis not present

## 2019-07-06 DIAGNOSIS — R1114 Bilious vomiting: Secondary | ICD-10-CM

## 2019-07-06 LAB — COMPREHENSIVE METABOLIC PANEL
ALT: 29 U/L (ref 0–44)
AST: 34 U/L (ref 15–41)
Albumin: 3 g/dL — ABNORMAL LOW (ref 3.5–5.0)
Alkaline Phosphatase: 109 U/L (ref 38–126)
Anion gap: 13 (ref 5–15)
BUN: 44 mg/dL — ABNORMAL HIGH (ref 8–23)
CO2: 26 mmol/L (ref 22–32)
Calcium: 8.8 mg/dL — ABNORMAL LOW (ref 8.9–10.3)
Chloride: 95 mmol/L — ABNORMAL LOW (ref 98–111)
Creatinine, Ser: 1.63 mg/dL — ABNORMAL HIGH (ref 0.61–1.24)
GFR calc Af Amer: 43 mL/min — ABNORMAL LOW (ref >60)
GFR calc non Af Amer: 37 mL/min — ABNORMAL LOW (ref >60)
Glucose, Bld: 104 mg/dL — ABNORMAL HIGH (ref 70–99)
Potassium: 3.8 mmol/L (ref 3.5–5.1)
Sodium: 134 mmol/L — ABNORMAL LOW (ref 135–145)
Total Bilirubin: 1.2 mg/dL (ref 0.3–1.2)
Total Protein: 6.5 g/dL (ref 6.5–8.1)

## 2019-07-06 LAB — CBC WITH DIFFERENTIAL/PLATELET
Abs Immature Granulocytes: 0.49 10*3/uL — ABNORMAL HIGH (ref 0.00–0.07)
Basophils Absolute: 0.1 10*3/uL (ref 0.0–0.1)
Basophils Relative: 1 %
Eosinophils Absolute: 0 10*3/uL (ref 0.0–0.5)
Eosinophils Relative: 0 %
HCT: 39.2 % (ref 39.0–52.0)
Hemoglobin: 12.7 g/dL — ABNORMAL LOW (ref 13.0–17.0)
Immature Granulocytes: 5 %
Lymphocytes Relative: 9 %
Lymphs Abs: 1 10*3/uL (ref 0.7–4.0)
MCH: 32.3 pg (ref 26.0–34.0)
MCHC: 32.4 g/dL (ref 30.0–36.0)
MCV: 99.7 fL (ref 80.0–100.0)
Monocytes Absolute: 1.1 10*3/uL — ABNORMAL HIGH (ref 0.1–1.0)
Monocytes Relative: 10 %
Neutro Abs: 8.3 10*3/uL — ABNORMAL HIGH (ref 1.7–7.7)
Neutrophils Relative %: 75 %
Platelets: 130 10*3/uL — ABNORMAL LOW (ref 150–400)
RBC: 3.93 MIL/uL — ABNORMAL LOW (ref 4.22–5.81)
RDW: 13.6 % (ref 11.5–15.5)
WBC: 10.9 10*3/uL — ABNORMAL HIGH (ref 4.0–10.5)
nRBC: 0 % (ref 0.0–0.2)

## 2019-07-06 LAB — CBC
HCT: 35.9 % — ABNORMAL LOW (ref 39.0–52.0)
Hemoglobin: 11.6 g/dL — ABNORMAL LOW (ref 13.0–17.0)
MCH: 32.4 pg (ref 26.0–34.0)
MCHC: 32.3 g/dL (ref 30.0–36.0)
MCV: 100.3 fL — ABNORMAL HIGH (ref 80.0–100.0)
Platelets: 125 10*3/uL — ABNORMAL LOW (ref 150–400)
RBC: 3.58 MIL/uL — ABNORMAL LOW (ref 4.22–5.81)
RDW: 13.5 % (ref 11.5–15.5)
WBC: 8.4 10*3/uL (ref 4.0–10.5)
nRBC: 0 % (ref 0.0–0.2)

## 2019-07-06 LAB — CREATININE, SERUM
Creatinine, Ser: 1.44 mg/dL — ABNORMAL HIGH (ref 0.61–1.24)
GFR calc Af Amer: 50 mL/min — ABNORMAL LOW (ref >60)
GFR calc non Af Amer: 43 mL/min — ABNORMAL LOW (ref >60)

## 2019-07-06 LAB — URINALYSIS, ROUTINE W REFLEX MICROSCOPIC
Bilirubin Urine: NEGATIVE
Glucose, UA: NEGATIVE mg/dL
Ketones, ur: NEGATIVE mg/dL
Nitrite: NEGATIVE
Protein, ur: 100 mg/dL — AB
RBC / HPF: >50 RBC/hpf — ABNORMAL HIGH (ref 0–5)
Specific Gravity, Urine: 1.011 (ref 1.005–1.030)
WBC, UA: >50 WBC/hpf — ABNORMAL HIGH (ref 0–5)
pH: 7 (ref 5.0–8.0)

## 2019-07-06 LAB — LIPASE, BLOOD: Lipase: 45 U/L (ref 11–51)

## 2019-07-06 LAB — SARS CORONAVIRUS 2 (TAT 6-24 HRS): SARS Coronavirus 2: NEGATIVE

## 2019-07-06 MED ORDER — SODIUM CHLORIDE 0.9 % IV SOLN
Freq: Once | INTRAVENOUS | Status: AC
  Administered 2019-07-06: 16:00:00 via INTRAVENOUS

## 2019-07-06 MED ORDER — ONDANSETRON HCL 4 MG/2ML IJ SOLN: 4.0000 mg | Freq: Four times a day (QID) | INTRAMUSCULAR | Status: DC | PRN

## 2019-07-06 MED ORDER — POLYETHYLENE GLYCOL 3350 17 G PO PACK: 17.0000 g | PACK | Freq: Every day | ORAL | Status: DC | PRN

## 2019-07-06 MED ORDER — DIVALPROEX SODIUM 250 MG PO DR TAB
500.0000 mg | DELAYED_RELEASE_TABLET | Freq: Two times a day (BID) | ORAL | Status: DC
  Administered 2019-07-06 – 2019-07-09 (×6): 500 mg via ORAL
  Filled 2019-07-06 (×8): qty 2

## 2019-07-06 MED ORDER — SODIUM CHLORIDE 0.9 % IV SOLN
INTRAVENOUS | Status: DC
  Administered 2019-07-06 – 2019-07-07 (×3): via INTRAVENOUS

## 2019-07-06 MED ORDER — DONEPEZIL HCL 10 MG PO TABS
10.0000 mg | ORAL_TABLET | Freq: Every day | ORAL | Status: DC
  Administered 2019-07-06 – 2019-07-09 (×4): 10 mg via ORAL
  Filled 2019-07-06 (×4): qty 1

## 2019-07-06 MED ORDER — SODIUM CHLORIDE 0.9 % IV SOLN
1.0000 g | INTRAVENOUS | Status: DC
  Administered 2019-07-07 – 2019-07-10 (×4): 1 g via INTRAVENOUS
  Filled 2019-07-06 (×4): qty 1

## 2019-07-06 MED ORDER — MEMANTINE HCL 10 MG PO TABS
10.0000 mg | ORAL_TABLET | Freq: Two times a day (BID) | ORAL | Status: DC
  Administered 2019-07-06 – 2019-07-09 (×6): 10 mg via ORAL
  Filled 2019-07-06 (×8): qty 1

## 2019-07-06 MED ORDER — FINASTERIDE 5 MG PO TABS
5.0000 mg | ORAL_TABLET | Freq: Every morning | ORAL | Status: DC
  Administered 2019-07-07 – 2019-07-09 (×3): 5 mg via ORAL
  Filled 2019-07-06 (×4): qty 1

## 2019-07-06 MED ORDER — ONDANSETRON HCL 4 MG/2ML IJ SOLN
4.0000 mg | Freq: Once | INTRAMUSCULAR | Status: AC
  Administered 2019-07-06: 4 mg via INTRAVENOUS
  Filled 2019-07-06: qty 2

## 2019-07-06 MED ORDER — ACETAMINOPHEN 325 MG PO TABS
650.0000 mg | ORAL_TABLET | Freq: Four times a day (QID) | ORAL | Status: DC | PRN
  Administered 2019-07-08: 650 mg via ORAL
  Filled 2019-07-06: qty 2

## 2019-07-06 MED ORDER — HEPARIN SODIUM (PORCINE) 5000 UNIT/ML IJ SOLN
5000.0000 [IU] | Freq: Three times a day (TID) | INTRAMUSCULAR | Status: DC
  Administered 2019-07-06 – 2019-07-07 (×3): 5000 [IU] via SUBCUTANEOUS
  Filled 2019-07-06 (×3): qty 1

## 2019-07-06 MED ORDER — ACETAMINOPHEN 650 MG RE SUPP: 650.0000 mg | Freq: Four times a day (QID) | RECTAL | Status: DC | PRN

## 2019-07-06 MED ORDER — LAMOTRIGINE 100 MG PO TABS
100.0000 mg | ORAL_TABLET | Freq: Two times a day (BID) | ORAL | Status: DC
  Administered 2019-07-06 – 2019-07-09 (×7): 100 mg via ORAL
  Filled 2019-07-06 (×8): qty 1

## 2019-07-06 MED ORDER — SODIUM CHLORIDE 0.9 % IV SOLN
1.0000 g | Freq: Once | INTRAVENOUS | Status: AC
  Administered 2019-07-06: 1 g via INTRAVENOUS
  Filled 2019-07-06: qty 10

## 2019-07-06 MED ORDER — ONDANSETRON HCL 4 MG PO TABS: 4.0000 mg | ORAL_TABLET | Freq: Four times a day (QID) | ORAL | Status: DC | PRN

## 2019-07-06 NOTE — ED Notes (Signed)
Hospitalist at bedside 

## 2019-07-06 NOTE — ED Triage Notes (Signed)
Per [facility] pt from home for vomiting today. Pt has dementia and at his baseline per wife and caregiver at home.

## 2019-07-06 NOTE — ED Notes (Signed)
Ultrasound at bedside

## 2019-07-06 NOTE — ED Provider Notes (Signed)
[facility]-EMERGENCY DEPT Provider Note   CSN: [id number] Arrival date & time: 07/06/19  1045     History   Chief Complaint Chief Complaint  Patient presents with   Emesis    HPI Zachary Ali is a 85 y.o. male.     HPI Patient presents from home with family concerns of nausea, vomiting, weakness. Patient has dementia, level 5 caveat. Patient answers no questions reliably, is minimally verbal, sitting upright, but not interactive. Per report the patient has almost completed a course of antibiotics for urinary tract infection. However, he was intolerant of these medications over the past 2 or 3 days secondary to persistent nausea, vomiting.  On there is concurrent diminished oral intake during this illness.  Past Medical History:  Diagnosis Date   Allergic rhinitis    BPH (benign prostatic hypertrophy)    seen urology, Dr. Earlene Plater   Bronchitis    episode of 2/10   Cerebral hemorrhage (HCC)    approx 50 years ago   Dyslipidemia    diet control   Edema leg    hx of   Elevated PSA    dr. Earlene Plater PSA 3-6; no PSA-urology f/u for exam yrly   Foley catheter in place    Insomnia    OA (osteoarthritis) of knee    left    Palpitations    w/u with Card-Dr. Anne Fu   Paroxysmal supraventricular tachycardia (HCC)    Pneumonia    hx of    Seizure (HCC) 01/08/2018   since childhood, Dr. Debarah Crape; off phenoparb since 2011, after many yr, doing ok   Small vessel disease (HCC)    SOB (shortness of breath)    walking distances/climbing stairs   Stroke [facility])    hx of CVA   SVT (supraventricular tachycardia) (HCC)    Urinary tract infection    hx of     Patient Active Problem List   Diagnosis Date Noted   Memory loss 09/17/2018   Mild cognitive impairment 09/03/2017   Gait abnormality 09/03/2017   Seizure disorder (HCC) 08/28/2016   UTI (urinary tract infection) 12/08/2014   Acute renal failure (HCC) 12/08/2014   Urinary retention  12/08/2014   Thrombocytopenia (HCC) 12/08/2014   Anemia 12/08/2014   Lung nodule 12/08/2014   Fall at home 12/08/2014   Diarrhea 12/08/2014   UTI (lower urinary tract infection) 12/08/2014   Altered mental state 06/25/2014   CVA (cerebral infarction) 06/25/2014   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   Benign prostate hyperplasia 03/03/2012   Elevated prostate specific antigen (PSA) 09/03/2011   Urinary urgency 09/03/2011    Past Surgical History:  Procedure Laterality Date   CATARACT EXTRACTION     2011-12/left    GREEN LIGHT LASER TURP (TRANSURETHRAL RESECTION OF PROSTATE N/A 04/05/2015   Procedure: GREEN LIGHT LASER TURP (TRANSURETHRAL RESECTION OF PROSTATE;  Surgeon: Jerilee Field, MD;  Location: [facility];  Service: Urology;  Laterality: N/A;   HEMORRHOID SURGERY     US ECHOCARDIOGRAPHY     ok; stress test ok-05-2013 nl LVF, tr AR   VASECTOMY          Home Medications    Prior to Admission medications   Medication Sig Start Date End Date Taking? Authorizing Provider  CRANBERRY PO Take 1 tablet by mouth as needed (UTI control).   Yes [provider]  divalproex (DEPAKOTE) 500 MG DR tablet Take 1 tablet (500 mg total) by mouth 2 (two) times daily. 05/07/19  Yes Terrace Arabia,  Yijun, MD  donepezil (ARICEPT) 10 MG tablet Take 1 tablet (10 mg total) by mouth at bedtime. 03/24/19  Yes Marcial Pacas, MD  finasteride (PROSCAR) 5 MG tablet Take 5 mg by mouth every morning.  08/09/14  Yes [provider]  furosemide (LASIX) 40 MG tablet Take 40 mg by mouth daily.  08/15/15  Yes [provider]  lamoTRIgine (LAMICTAL) 100 MG tablet Take 1 tablet (100 mg total) by mouth daily. Please fill lamotrigine 25mg  Rx first, Patient taking differently: Take 100 mg by mouth 2 (two) times daily.  04/13/19  Yes Marcial Pacas, MD  memantine (NAMENDA) 10 MG tablet Take 1 tablet (10 mg total) by mouth 2 (two) times daily. 03/24/19  Yes Marcial Pacas, MD  metoprolol  succinate (TOPROL-XL) 25 MG 24 hr tablet Take 25 mg by mouth every morning.    Yes [provider]  nitrofurantoin, macrocrystal-monohydrate, (MACROBID) 100 MG capsule Take 100 mg by mouth 2 (two) times daily. 10 day supply 06/26/19  Yes [provider]  potassium chloride (KLOR-CON) 8 MEQ tablet Take 8 mEq by mouth daily.  11/22/14  Yes [provider]  acetaminophen (TYLENOL) 325 MG tablet Take 2 tablets (650 mg total) by mouth every 6 (six) hours as needed for mild pain, moderate pain, fever or headache (or Fever >/= 101). Patient not taking: Reported on 07/06/2019 12/11/14   Modena Jansky, MD  aspirin EC 81 MG EC tablet Take 1 tablet (81 mg total) by mouth daily. Patient not taking: Reported on 07/06/2019 06/27/14   Mikhail, Velta Addison, DO  DILANTIN 100 MG ER capsule TAKE (1) CAPSULE THREE TIMES DAILY. Patient not taking: No sig reported 01/13/19   Marcial Pacas, MD  lamoTRIgine (LAMICTAL) 25 MG tablet Take 1 tablet (25 mg total) by mouth daily. 1 tablet twice a day for the first week 2 tablets twice a day for the second week 3 tablets twice a day for the third week 4 tablets twice a day for the fourth week  For total of 140 tablets  After finish titration with small dose of lamotrigine 25 mg, change to lamotrigine 100 mg twice a day Patient not taking: Reported on 07/06/2019 04/13/19   Marcial Pacas, MD    Family History Family History  Problem Relation Age of Onset   Heart disease Mother    Coronary artery disease Mother    Coronary artery disease Father    Heart disease Father    Heart failure Other     Social History Social History   Tobacco Use   Smoking status: Former Smoker    Packs/day: 2.00    Years: 20.00    Pack years: 40.00    Types: Cigarettes    Quit date: 10/15/1974    Years since quitting: 44.7   Smokeless tobacco: Never Used  Substance Use Topics   Alcohol use: Yes    Alcohol/week: 5.0 standard drinks    Types: 5 Glasses of wine per  week    Comment: occas    Drug use: No     Allergies   Patient has no known allergies.   Review of Systems Review of Systems  Unable to perform ROS: Dementia     Physical Exam Updated Vital Signs There were no vitals taken for this visit.  Physical Exam Vitals signs and nursing note reviewed.  Constitutional:      Appearance: He is well-developed. He is ill-appearing.     Comments: Sickly appearing elderly male sitting upright noninteractive  HENT:     Head: Normocephalic and atraumatic.  Eyes:     Conjunctiva/sclera: Conjunctivae normal.  Cardiovascular:     Rate and Rhythm: Normal rate and regular rhythm.  Pulmonary:     Effort: Pulmonary effort is normal. No respiratory distress.     Breath sounds: No stridor.  Abdominal:     General: There is no distension.     Tenderness: There is no abdominal tenderness. There is no guarding.  Skin:    General: Skin is warm and dry.  Neurological:     Mental Status: He is alert.     Motor: Atrophy present.     Comments: Patient does not follow commands reliably, does move all extremities spontaneously, does give occasional verbal utterances.  Psychiatric:        Behavior: Behavior is withdrawn.        Cognition and Memory: Cognition is impaired.      ED Treatments / Results  Labs (all labs ordered are listed, but only abnormal results are displayed) Labs Reviewed  COMPREHENSIVE METABOLIC PANEL - Abnormal; Notable for the following components:      Result Value   Sodium 134 (*)    Chloride 95 (*)    Glucose, Bld 104 (*)    BUN 44 (*)    Creatinine, Ser 1.63 (*)    Calcium 8.8 (*)    Albumin 3.0 (*)    GFR calc non Af Amer 37 (*)    GFR calc Af Amer 43 (*)    All other components within normal limits  CBC WITH DIFFERENTIAL/PLATELET - Abnormal; Notable for the following components:   WBC 10.9 (*)    RBC 3.93 (*)    Hemoglobin 12.7 (*)    Platelets 130 (*)    Neutro Abs 8.3 (*)    Monocytes Absolute 1.1 (*)      Abs Immature Granulocytes 0.49 (*)    All other components within normal limits  URINALYSIS, ROUTINE W REFLEX MICROSCOPIC - Abnormal; Notable for the following components:   APPearance TURBID (*)    Hgb urine dipstick SMALL (*)    Protein, ur 100 (*)    Leukocytes,Ua LARGE (*)    RBC / HPF >50 (*)    WBC, UA >50 (*)    Bacteria, UA MANY (*)    All other components within normal limits  URINE CULTURE  SARS CORONAVIRUS 2 (TAT 6-24 HRS)  LIPASE, BLOOD     Procedures Procedures (including critical care time)  Medications Ordered in ED Medications  0.9 %  sodium chloride infusion ( Intravenous New Bag/Given (Non-Interop) 07/06/19 1147)  cefTRIAXone (ROCEPHIN) 1 g in sodium chloride 0.9 % 100 mL IVPB (has no administration in time range)  ondansetron (ZOFRAN) injection 4 mg (4 mg Intravenous Given 07/06/19 1147)     Initial Impression / Assessment and Plan / ED Course  I have reviewed the triage vital signs and the nursing notes.  Pertinent labs & imaging results that were available during my care of the patient were reviewed by me and considered in my medical decision making (see chart for details).  Labs notable for acute kidney injury, persistent demonstration of urinary tract infection, though the patient is reported to be on antibiotics, Macrobid. Patient is hemodynamically unremarkable, but given concern for nausea, vomiting, AKI, urinary tract infection, he will start IV ceftriaxone, COVID test pending, patient admitted for further monitoring, management.  Final Clinical Impressions(s) / ED Diagnoses   Final diagnoses:  Bilious vomiting, presence  of nausea not specified  AKI (acute kidney injury) (HCC)  Lower urinary tract infectious disease     Gerhard MunchLockwood, Mainor Hellmann, MD 07/06/19 1236

## 2019-07-06 NOTE — ED Notes (Signed)
ED Provider at bedside. 

## 2019-07-06 NOTE — Progress Notes (Signed)
I spoke with Pt"s  Wife Zachary Ali  to obtain admission information.Marland Kitchen

## 2019-07-06 NOTE — ED Notes (Signed)
Pt will be transported upstairs after ultrasound is finished with test.

## 2019-07-06 NOTE — H&P (Addendum)
.  History and Physical    Zachary Ali:[id number] DOB: 1929-07-09 DOA: 07/06/2019  PCP: Georgann Housekeeper, MD  Patient coming from: home   Chief Complaint: Nausea and vomiting  HPI: Zachary Ali is a 85 y.o. male with medical history significant of dementia, recurrent UTI. Patient has history of dementia. Unable to give history. History is per wife on phone. She reports that the patient has not felt well for the last 3 to 4 days. She says that he has been nauseous and not wanting to eat. He say said on several occasions that he is "hurting" but does not tell her where. She noted that yesterday and this morning he was vomiting. She does not note any fevers or chills. When awaking him this morning, he apparently slipped in his wheelchair and appeared weak. She became concerned and sent him to the ED.   ED Course: Lab work obtained showed AKI and UTI. [facility] was called for admission.   Review of Systems: Unable to obtain d/t mentation.   Past Medical History:  Diagnosis Date  . Allergic rhinitis   . BPH (benign prostatic hypertrophy)    seen urology, Dr. Earlene Plater  . Bronchitis    episode of 2/10  . Cerebral hemorrhage (HCC)    approx 50 years ago  . Dyslipidemia    diet control  . Edema leg    hx of  . Elevated PSA    dr. Earlene Plater PSA 3-6; no PSA-urology f/u for exam yrly  . Foley catheter in place   . Insomnia   . OA (osteoarthritis) of knee    left   . Palpitations    w/u with Card-Dr. Anne Fu  . Paroxysmal supraventricular tachycardia (HCC)   . Pneumonia    hx of   . Seizure (HCC) 01/08/2018   since childhood, Dr. Debarah Crape; off phenoparb since 2011, after many yr, doing ok  . Small vessel disease (HCC)   . SOB (shortness of breath)    walking distances/climbing stairs  . Stroke [facility])    hx of CVA  . SVT (supraventricular tachycardia) (HCC)   . Urinary tract infection    hx of     Past Surgical History:  Procedure Laterality Date  . CATARACT EXTRACTION     2011-12/left   .  GREEN LIGHT LASER TURP (TRANSURETHRAL RESECTION OF PROSTATE N/A 04/05/2015   Procedure: GREEN LIGHT LASER TURP (TRANSURETHRAL RESECTION OF PROSTATE;  Surgeon: Jerilee Field, MD;  Location: [facility];  Service: Urology;  Laterality: N/A;  . HEMORRHOID SURGERY    . US ECHOCARDIOGRAPHY     ok; stress test ok-05-2013 nl LVF, tr AR  . VASECTOMY       reports that he quit smoking about 44 years ago. His smoking use included cigarettes. He has a 40.00 pack-year smoking history. He has never used smokeless tobacco. He reports current alcohol use of about 5.0 standard drinks of alcohol per week. He reports that he does not use drugs.  No Known Allergies  Family History  Problem Relation Age of Onset  . Heart disease Mother   . Coronary artery disease Mother   . Coronary artery disease Father   . Heart disease Father   . Heart failure Other     Prior to Admission medications   Medication Sig Start Date End Date Taking? Authorizing Provider  CRANBERRY PO Take 1 tablet by mouth as needed (UTI control).   Yes [provider]  divalproex (DEPAKOTE) 500 MG DR tablet  Take 1 tablet (500 mg total) by mouth 2 (two) times daily. 05/07/19  Yes Levert Feinstein, MD  donepezil (ARICEPT) 10 MG tablet Take 1 tablet (10 mg total) by mouth at bedtime. 03/24/19  Yes Levert Feinstein, MD  finasteride (PROSCAR) 5 MG tablet Take 5 mg by mouth every morning.  08/09/14  Yes [provider]  furosemide (LASIX) 40 MG tablet Take 40 mg by mouth daily.  08/15/15  Yes [provider]  lamoTRIgine (LAMICTAL) 100 MG tablet Take 1 tablet (100 mg total) by mouth daily. Please fill lamotrigine 25mg  Rx first, Patient taking differently: Take 100 mg by mouth 2 (two) times daily.  04/13/19  Yes 04/15/19, MD  memantine (NAMENDA) 10 MG tablet Take 1 tablet (10 mg total) by mouth 2 (two) times daily. 03/24/19  Yes 05/24/19, MD  metoprolol succinate (TOPROL-XL) 25 MG 24 hr tablet Take 25 mg by mouth every morning.    Yes  [provider]  nitrofurantoin, macrocrystal-monohydrate, (MACROBID) 100 MG capsule Take 100 mg by mouth 2 (two) times daily. 10 day supply 06/26/19  Yes [provider]  potassium chloride (KLOR-CON) 8 MEQ tablet Take 8 mEq by mouth daily.  11/22/14  Yes [provider]  acetaminophen (TYLENOL) 325 MG tablet Take 2 tablets (650 mg total) by mouth every 6 (six) hours as needed for mild pain, moderate pain, fever or headache (or Fever >/= 101). Patient not taking: Reported on 07/06/2019 12/11/14   12/13/14, MD  aspirin EC 81 MG EC tablet Take 1 tablet (81 mg total) by mouth daily. Patient not taking: Reported on 07/06/2019 06/27/14   Mikhail, 06/29/14, DO  DILANTIN 100 MG ER capsule TAKE (1) CAPSULE THREE TIMES DAILY. Patient not taking: No sig reported 01/13/19   01/15/19, MD  lamoTRIgine (LAMICTAL) 25 MG tablet Take 1 tablet (25 mg total) by mouth daily. 1 tablet twice a day for the first week 2 tablets twice a day for the second week 3 tablets twice a day for the third week 4 tablets twice a day for the fourth week  For total of 140 tablets  After finish titration with small dose of lamotrigine 25 mg, change to lamotrigine 100 mg twice a day Patient not taking: Reported on 07/06/2019 04/13/19   04/15/19, MD    Physical Exam: There were no vitals filed for this visit.  General: 85 y.o. male resting in bed in NAD Eyes: PERRL, normal sclera ENMT: Nares patent w/o discharge, orophaynx clear, dentition normal, ears w/o discharge/lesions/ulcers Cardiovascular: brady, +S1, S2, no m/g/r, equal pulses throughout Respiratory: upper airway transmission normal WOB GI: BS+, ND, no masses noted, no organomegaly noted, TTP suprapubic, LLQ MSK: No e/c/c Skin: No rashes, bruises, ulcerations noted Neuro: tracks around room, only talks to me when I palpate his stomach and he has pain, otherwise, not following commands or interacting    Labs on Admission: I have  personally reviewed following labs and imaging studies  CBC: Recent Labs  Lab 07/06/19 1149  WBC 10.9*  NEUTROABS 8.3*  HGB 12.7*  HCT 39.2  MCV 99.7  PLT 130*   Basic Metabolic Panel: Recent Labs  Lab 07/06/19 1149  NA 134*  K 3.8  CL 95*  CO2 26  GLUCOSE 104*  BUN 44*  CREATININE 1.63*  CALCIUM 8.8*   GFR: CrCl cannot be calculated (Unknown ideal weight.). Liver Function Tests: Recent Labs  Lab 07/06/19 1149  AST 34  ALT 29  ALKPHOS 109  BILITOT 1.2  PROT 6.5  ALBUMIN 3.0*   Recent Labs  Lab 07/06/19 1149  LIPASE 45   No results for input(s): AMMONIA in the last 168 hours. Coagulation Profile: No results for input(s): INR, PROTIME in the last 168 hours. Cardiac Enzymes: No results for input(s): CKTOTAL, CKMB, CKMBINDEX, TROPONINI in the last 168 hours. BNP (last 3 results) No results for input(s): PROBNP in the last 8760 hours. HbA1C: No results for input(s): HGBA1C in the last 72 hours. CBG: No results for input(s): GLUCAP in the last 168 hours. Lipid Profile: No results for input(s): CHOL, HDL, LDLCALC, TRIG, CHOLHDL, LDLDIRECT in the last 72 hours. Thyroid Function Tests: No results for input(s): TSH, T4TOTAL, FREET4, T3FREE, THYROIDAB in the last 72 hours. Anemia Panel: No results for input(s): VITAMINB12, FOLATE, FERRITIN, TIBC, IRON, RETICCTPCT in the last 72 hours. Urine analysis:    Component Value Date/Time   COLORURINE YELLOW 07/06/2019 1125   APPEARANCEUR TURBID (A) 07/06/2019 1125   LABSPEC 1.011 07/06/2019 1125   PHURINE 7.0 07/06/2019 1125   GLUCOSEU NEGATIVE 07/06/2019 1125   HGBUR SMALL (A) 07/06/2019 1125   BILIRUBINUR NEGATIVE 07/06/2019 1125   KETONESUR NEGATIVE 07/06/2019 1125   PROTEINUR 100 (A) 07/06/2019 1125   UROBILINOGEN 0.2 12/08/2014 1629   NITRITE NEGATIVE 07/06/2019 1125   LEUKOCYTESUR LARGE (A) 07/06/2019 1125    Radiological Exams on Admission: No results found.  EKG: Independently reviewed.   Assessment/Plan Active Problems:   Altered mental state   UTI (urinary tract infection)   AKI (acute kidney injury) (HCC)   Nausea and vomiting   Dementia without behavioral disturbance (HCC)   Abdominal pain   UTI SIRS N/V     - hypotensive, AKI, afebrile, slightly elevated white count     - Hx of chronic/recurrent UTI     - UA dirty, UCx ordered     - started on rocephin in ED; continue     - check bld cx as well     - admit inpt med-surg  AKI     - fluids     - follow I&O     - on finasteride, check renal US  Abdominal pain on exam     - mostly suprapubic, but some LLQ     - likely related to above     - LFTs, lipase ok     - monitor  Altered mental state Hx of dementia     - wife says he "just hasn't been himself"; however, outside of c/o hurting, she is unable to qualify that statement     - Hx of dementia     - not participating in questioning during exam     - namenda, aricept       DVT prophylaxis: heparin  Code Status: DNR  Family Communication: With wife on phone  Disposition Plan: TBD  Consults called: None  Admission status: Inpatient. Nausea, vomitting: need for IV fluids, IV abx.    Jonnie Finner DO [facility] Pager [phone]  If 7PM-7AM, please contact night-coverage [email or web address] Password [email or web address]  07/06/2019, 1:09 PM

## 2019-07-07 DIAGNOSIS — N390 Urinary tract infection, site not specified: Secondary | ICD-10-CM | POA: Diagnosis present

## 2019-07-07 DIAGNOSIS — A419 Sepsis, unspecified organism: Secondary | ICD-10-CM | POA: Diagnosis present

## 2019-07-07 DIAGNOSIS — G9341 Metabolic encephalopathy: Secondary | ICD-10-CM

## 2019-07-07 DIAGNOSIS — R109 Unspecified abdominal pain: Secondary | ICD-10-CM

## 2019-07-07 LAB — COMPREHENSIVE METABOLIC PANEL
ALT: 23 U/L (ref 0–44)
AST: 18 U/L (ref 15–41)
Albumin: 2.4 g/dL — ABNORMAL LOW (ref 3.5–5.0)
Alkaline Phosphatase: 80 U/L (ref 38–126)
Anion gap: 9 (ref 5–15)
BUN: 34 mg/dL — ABNORMAL HIGH (ref 8–23)
CO2: 24 mmol/L (ref 22–32)
Calcium: 8 mg/dL — ABNORMAL LOW (ref 8.9–10.3)
Chloride: 104 mmol/L (ref 98–111)
Creatinine, Ser: 1.31 mg/dL — ABNORMAL HIGH (ref 0.61–1.24)
GFR calc Af Amer: 56 mL/min — ABNORMAL LOW (ref >60)
GFR calc non Af Amer: 48 mL/min — ABNORMAL LOW (ref >60)
Glucose, Bld: 88 mg/dL (ref 70–99)
Potassium: 3.3 mmol/L — ABNORMAL LOW (ref 3.5–5.1)
Sodium: 137 mmol/L (ref 135–145)
Total Bilirubin: 0.7 mg/dL (ref 0.3–1.2)
Total Protein: 5.1 g/dL — ABNORMAL LOW (ref 6.5–8.1)

## 2019-07-07 LAB — CBC
HCT: 32.1 % — ABNORMAL LOW (ref 39.0–52.0)
Hemoglobin: 10.5 g/dL — ABNORMAL LOW (ref 13.0–17.0)
MCH: 33.4 pg (ref 26.0–34.0)
MCHC: 32.7 g/dL (ref 30.0–36.0)
MCV: 102.2 fL — ABNORMAL HIGH (ref 80.0–100.0)
Platelets: 122 10*3/uL — ABNORMAL LOW (ref 150–400)
RBC: 3.14 MIL/uL — ABNORMAL LOW (ref 4.22–5.81)
RDW: 13.8 % (ref 11.5–15.5)
WBC: 11.9 10*3/uL — ABNORMAL HIGH (ref 4.0–10.5)
nRBC: 0 % (ref 0.0–0.2)

## 2019-07-07 LAB — IRON AND TIBC
Iron: 46 ug/dL (ref 45–182)
Saturation Ratios: 19 % (ref 17.9–39.5)
TIBC: 236 ug/dL — ABNORMAL LOW (ref 250–450)
UIBC: 190 ug/dL

## 2019-07-07 LAB — FOLATE: Folate: 6 ng/mL (ref >5.9)

## 2019-07-07 LAB — FERRITIN: Ferritin: 124 ng/mL (ref 24–336)

## 2019-07-07 LAB — VITAMIN B12: Vitamin B-12: 662 pg/mL (ref 180–914)

## 2019-07-07 MED ORDER — CHLORHEXIDINE GLUCONATE CLOTH 2 % EX PADS
6.0000 | MEDICATED_PAD | Freq: Every day | CUTANEOUS | Status: DC
  Administered 2019-07-07 – 2019-07-09 (×3): 6 via TOPICAL

## 2019-07-07 MED ORDER — POTASSIUM CHLORIDE 20 MEQ PO PACK
40.0000 meq | PACK | Freq: Once | ORAL | Status: AC
  Administered 2019-07-07: 12:00:00 40 meq via ORAL
  Filled 2019-07-07: qty 2

## 2019-07-07 MED ORDER — ENOXAPARIN SODIUM 30 MG/0.3ML ~~LOC~~ SOLN
30.0000 mg | SUBCUTANEOUS | Status: DC
  Administered 2019-07-07 – 2019-07-09 (×3): 30 mg via SUBCUTANEOUS
  Filled 2019-07-07 (×4): qty 0.3

## 2019-07-07 NOTE — Evaluation (Signed)
Clinical/Bedside Swallow Evaluation Patient Details  Name: Zachary Ali MRN: [id number] Date of Birth: Oct 09, 1929  Today's Date: 07/07/2019 Time: SLP Start Time (ACUTE ONLY): 1220 SLP Stop Time (ACUTE ONLY): 1230 SLP Time Calculation (min) (ACUTE ONLY): 10 min  Past Medical History:  Past Medical History:  Diagnosis Date  . Allergic rhinitis   . BPH (benign prostatic hypertrophy)    seen urology, Dr. Earlene Plater  . Bronchitis    episode of 2/10  . Cerebral hemorrhage (HCC)    approx 50 years ago  . Dyslipidemia    diet control  . Edema leg    hx of  . Elevated PSA    dr. Earlene Plater PSA 3-6; no PSA-urology f/u for exam yrly  . Foley catheter in place   . Insomnia   . OA (osteoarthritis) of knee    left   . Palpitations    w/u with Card-Dr. Anne Fu  . Paroxysmal supraventricular tachycardia (HCC)   . Pneumonia    hx of   . Seizure (HCC) 01/08/2018   since childhood, Dr. Debarah Crape; off phenoparb since 2011, after many yr, doing ok  . Small vessel disease (HCC)   . SOB (shortness of breath)    walking distances/climbing stairs  . Stroke [facility])    hx of CVA  . SVT (supraventricular tachycardia) (HCC)   . Urinary tract infection    hx of    Past Surgical History:  Past Surgical History:  Procedure Laterality Date  . CATARACT EXTRACTION     2011-12/left   . GREEN LIGHT LASER TURP (TRANSURETHRAL RESECTION OF PROSTATE N/A 04/05/2015   Procedure: GREEN LIGHT LASER TURP (TRANSURETHRAL RESECTION OF PROSTATE;  Surgeon: Jerilee Field, MD;  Location: [facility];  Service: Urology;  Laterality: N/A;  . HEMORRHOID SURGERY    . US ECHOCARDIOGRAPHY     ok; stress test ok-05-2013 nl LVF, tr AR  . VASECTOMY     HPI:  85yo male admitted 07/06/2019 with N/V, recent falls, weakness, "just hasn't been himself". PMH: dementia, cerebral hemorrhage, CVA, PNA   Assessment / Plan / Recommendation Clinical Impression  Nurse tech was attempting to assist pt with lunch upon arrival of SLP, however, pt unwilling  to take anything by mouth other than milk. Pt was noted to cough several minutes after finishing his milk, possibly due to suboptimal positioning and suspected esophageal dysmotility. No immediate cough response noted after the swallow of thin liquid. SLP will follow acutely for diet tolerance. Hopefully pt will accept additional po trials in the near future. Will continue current diet to allow broader range of choices.    SLP Visit Diagnosis: Dysphagia, unspecified (R13.10)    Aspiration Risk  Mild aspiration risk;Moderate aspiration risk;Risk for inadequate nutrition/hydration    Diet Recommendation Dysphagia 3 (Mech soft);Thin liquid   Liquid Administration via: Straw;Cup Medication Administration: (as pt will accept) Supervision: Patient able to self feed;Staff to assist with self feeding;Full supervision/cueing for compensatory strategies Compensations: Minimize environmental distractions;Slow rate;Small sips/bites Postural Changes: Remain upright for at least 30 minutes after po intake;Seated upright at 90 degrees    Other  Recommendations Oral Care Recommendations: Oral care BID   Follow up Recommendations 24 hour supervision/assistance;Skilled Nursing facility      Frequency and Duration min 1 x/week  2 weeks;1 week       Prognosis Prognosis for Safe Diet Advancement: Fair Barriers to Reach Goals: Cognitive deficits      Swallow Study   General Date of Onset: 07/06/19 HPI: 85yo male  admitted 07/06/2019 with N/V, recent falls, weakness, "just hasn't been himself". PMH: dementia, cerebral hemorrhage, CVA, PNA Type of Study: Bedside Swallow Evaluation Previous Swallow Assessment: none. SLE 06/28/2014 Diet Prior to this Study: Dysphagia 3 (soft);Thin liquids Temperature Spikes Noted: No Respiratory Status: Room air History of Recent Intubation: No Behavior/Cognition: Alert;Requires cueing;Doesn't follow directions Oral Cavity Assessment: Within Functional Limits Oral Care  Completed by SLP: No Oral Cavity - Dentition: Other (Comment)(unable to assess) Vision: Functional for self-feeding Self-Feeding Abilities: Able to feed self;Needs assist;Needs set up Patient Positioning: Upright in bed Baseline Vocal Quality: Normal Volitional Cough: Cognitively unable to elicit Volitional Swallow: Unable to elicit    Oral/Motor/Sensory Function Overall Oral Motor/Sensory Function: Within functional limits   Ice Chips Ice chips: Not tested   Thin Liquid Thin Liquid: Within functional limits Presentation: Straw    Nectar Thick Nectar Thick Liquid: Not tested   Honey Thick Honey Thick Liquid: Not tested   Puree Puree: Not tested   Solid     Solid: Not tested      B. Quentin Ore [facility], [place] Speech Language Pathologist [phone]  Shonna Chock 07/07/2019,3:04 PM

## 2019-07-07 NOTE — Evaluation (Signed)
Speech Language Pathology Evaluation Patient Details Name: Zachary Ali MRN: [id number] DOB: 1929/06/24 Today's Date: 07/07/2019 Time: 4098-1191 SLP Time Calculation (min) (ACUTE ONLY): 11 min  Problem List:  Patient Active Problem List   Diagnosis Date Noted  . AKI (acute kidney injury) ([place]) 07/06/2019  . Nausea and vomiting 07/06/2019  . Dementia without behavioral disturbance ([place]) 07/06/2019  . Abdominal pain 07/06/2019  . Memory loss 09/17/2018  . Mild cognitive impairment 09/03/2017  . Gait abnormality 09/03/2017  . Seizure disorder ([place]) 08/28/2016  . UTI (urinary tract infection) 12/08/2014  . Acute renal failure ([place]) 12/08/2014  . Urinary retention 12/08/2014  . Thrombocytopenia ([place]) 12/08/2014  . Anemia 12/08/2014  . Lung nodule 12/08/2014  . Fall at home 12/08/2014  . Diarrhea 12/08/2014  . UTI (lower urinary tract infection) 12/08/2014  . Altered mental state 06/25/2014  . CVA (cerebral infarction) 06/25/2014  . 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 Benign prostate hyperplasia 03/03/2012  . Elevated prostate specific antigen (PSA) 09/03/2011  . Urinary urgency 09/03/2011   Past Medical History:  Past Medical History:  Diagnosis Date  . Allergic rhinitis   . BPH (benign prostatic hypertrophy)    seen urology, Dr. Rosana Hoes  . Bronchitis    episode of 2/10  . Cerebral hemorrhage (HCC)    approx 50 years ago  . Dyslipidemia    diet control  . Edema leg    hx of  . Elevated PSA    dr. Rosana Hoes PSA 3-6; no PSA-urology f/u for exam yrly  . Foley catheter in place   . Insomnia   . OA (osteoarthritis) of knee    left   . Palpitations    w/u with Card-Dr. Marlou Porch  . Paroxysmal supraventricular tachycardia ([place])   . Pneumonia    hx of   . Seizure ([place]) 01/08/2018   since childhood, Dr. Evelena Leyden; off phenoparb since 2011, after many yr, doing ok  . Small vessel disease ([place])   . SOB (shortness of breath)    walking distances/climbing stairs  .  Stroke [facility])    hx of CVA  . SVT (supraventricular tachycardia) ([place])   . Urinary tract infection    hx of    Past Surgical History:  Past Surgical History:  Procedure Laterality Date  . CATARACT EXTRACTION     2011-12/left   . GREEN LIGHT LASER TURP (TRANSURETHRAL RESECTION OF PROSTATE N/A 04/05/2015   Procedure: GREEN LIGHT LASER TURP (TRANSURETHRAL RESECTION OF PROSTATE;  Surgeon: Festus Aloe, MD;  Location: [facility];  Service: Urology;  Laterality: N/A;  . HEMORRHOID SURGERY    . US ECHOCARDIOGRAPHY     ok; stress test ok-05-2013 nl LVF, tr AR  . VASECTOMY     HPI:  85yo male admitted 07/06/2019 with N/V, recent falls, weakness, "just hasn't been himself". PMH: dementia, cerebral hemorrhage, CVA, PNA   Assessment / Plan / Recommendation Clinical Impression  The Mini-Mental State Examination (MMSE) was administered. Pt scored 5/30 (n=24+/30), indicating significant cognitive linguistic impairment. Pt speech was intelligible, with pt indicating clearly that he didn't want anything to eat. He had difficulty with all sections of the MMSE, verbalizing frequently "I don't know", or "no, "I can't do that".  Pt had no family present to discuss previous level of function, however, H&P indicates history of dementia. Recommend 24 hour supervision at DC from acute care. Will defer any skilled ST intervention to the level venue.    SLP Assessment  SLP Recommendation/Assessment: All further Speech Language  Pathology needs can be addressed in the next venue of care SLP Visit Diagnosis: Cognitive communication deficit (R41.841)    Follow Up Recommendations  24 hour supervision/assistance;Skilled Nursing facility       SLP Evaluation Cognition  Overall Cognitive Status: History of cognitive impairments - at baseline Arousal/Alertness: Awake/alert Orientation Level: Disoriented X4       Comprehension  Auditory Comprehension Overall Auditory Comprehension: Impaired Yes/No Questions:  Impaired Commands: Impaired    Expression Expression Primary Mode of Expression: Verbal Verbal Expression Overall Verbal Expression: Impaired   Oral / Motor  Oral Motor/Sensory Function Overall Oral Motor/Sensory Function: Within functional limits Motor Speech Overall Motor Speech: Appears within functional limits for tasks assessed Intelligibility: Intelligible   GO                   Zachary Ali B. Murvin Natal [facility], CCC-SLP Speech Language Pathologist [phone]  Leigh Aurora 07/07/2019, 3:11 PM

## 2019-07-07 NOTE — Progress Notes (Signed)
PROGRESS NOTE    Zachary Ali  ZOX:[id number] DOB: [birth year] DOA: 07/06/2019 PCP: Georgann Housekeeper, MD    Brief Narrative:  HPI per Dr. Sherolyn Buba is a 85 y.o. male with medical history significant of dementia, recurrent UTI. Patient has history of dementia. Unable to give history. History is per wife on phone. She reports that the patient has not felt well for the last 3 to 4 days. She says that he has been nauseous and not wanting to eat. He say said on several occasions that he is "hurting" but does not tell her where. She noted that yesterday and this morning he was vomiting. She does not note any fevers or chills. When awaking him this morning, he apparently slipped in his wheelchair and appeared weak. She became concerned and sent him to the ED.   ED Course: Lab work obtained showed AKI and UTI. TRH was called for admission.   Assessment & Plan:   Principal Problem:   Sepsis secondary to UTI [facility]) Active Problems:   Acute renal failure (HCC)   AKI (acute kidney injury) (HCC)   Acute metabolic encephalopathy   Altered mental state   UTI (urinary tract infection)   Nausea and vomiting   Dementia without behavioral disturbance (HCC)   Abdominal pain  1 sepsis likely secondary to UTI Patient admitted with criteria meeting sepsis with acute kidney injury, afebrile, slight leukocytosis, noted to be hypotensive on admission, urinalysis concerning for UTI.  Patient pancultured and results pending.  SARS coronavirus 2 negative.  Urine cultures pending.  Continue empiric IV Rocephin.  Follow.  2.  Acute renal failure Likely secondary to a prerenal azotemia in the setting of diuretics, nausea and vomiting with GI losses and decreased oral intake.  Renal ultrasound negative for hydronephrosis.  Renal function trending down/improving with hydration.  Continue IV fluids.  Follow.  3.  Anemia  Likely dilutional.  Check an anemia panel.  Follow H&H.  Transfusion threshold hemoglobin  less than 7.  4.  Dementia without behavioral disturbance Continue home regimen of Depakote, Aricept, Lamictal, Namenda.  5.  Acute metabolic encephalopathy Likely secondary to problem #1 in the setting of underlying dementia.  6.  Abdominal pain Likely secondary to UTI.  Patient's abdominal pain was more suprapubic.  LFTs and lipase levels within normal limits.  Continue empiric IV antibiotics.  Follow.   DVT prophylaxis: Lovenox Code Status: DNR Family Communication: No family at bedside. Disposition Plan: Likely back home when clinically improved.   Consultants:   None  Procedures:   Renal ultrasound 07/06/2019  Antimicrobials:   IV Rocephin 07/06/2019   Subjective: Patient laying in bed.  Patient alert.  Patient pleasantly confused.  Patient however denies chest pain or shortness of breath no abdominal pain.  Patient stated he ate breakfast.  Lunch tray sitting on the side of the bed untouched.  Objective: Vitals:   07/07/19 0456 07/07/19 0503 07/07/19 0610 07/07/19 1342  BP: (!) 100/54 (!) 108/51 (!) 108/51 (!) 98/50  Pulse: 69 68 68 69  Resp: 16 14 14 18   Temp: 98.2 F (36.8 C)  98.2 F (36.8 C) (!) 97.2 F (36.2 C)  TempSrc:   Oral   SpO2: 95%   95%  Height:   5\' 8"  (1.727 m)     Intake/Output Summary (Last 24 hours) at 07/07/2019 1706 Last data filed at 07/07/2019 1600 Gross per 24 hour  Intake 2851.48 ml  Output 1450 ml  Net 1401.48 ml  Filed Weights    Examination:  General exam: NAD Respiratory system: Clear to auscultation. Respiratory effort normal. Cardiovascular system: S1 & S2 heard, RRR. No JVD, murmurs, rubs, gallops or clicks. No pedal edema. Gastrointestinal system: Abdomen is nondistended, soft and nontender. No organomegaly or masses felt. Normal bowel sounds heard. Central nervous system: Alert. No focal neurological deficits. Extremities: Symmetric 5 x 5 power. Skin: No rashes, lesions or ulcers Psychiatry: Judgement and insight  appear poor. Mood & affect appropriate.     Data Reviewed: I have personally reviewed following labs and imaging studies  CBC: Recent Labs  Lab 07/06/19 1149 07/06/19 1628 07/07/19 0541  WBC 10.9* 8.4 11.9*  NEUTROABS 8.3*  --   --   HGB 12.7* 11.6* 10.5*  HCT 39.2 35.9* 32.1*  MCV 99.7 100.3* 102.2*  PLT 130* 125* 703*   Basic Metabolic Panel: Recent Labs  Lab 07/06/19 1149 07/06/19 1628 07/07/19 0541  NA 134*  --  137  K 3.8  --  3.3*  CL 95*  --  104  CO2 26  --  24  GLUCOSE 104*  --  88  BUN 44*  --  34*  CREATININE 1.63* 1.44* 1.31*  CALCIUM 8.8*  --  8.0*   GFR: CrCl cannot be calculated (Unknown ideal weight.). Liver Function Tests: Recent Labs  Lab 07/06/19 1149 07/07/19 0541  AST 34 18  ALT 29 23  ALKPHOS 109 80  BILITOT 1.2 0.7  PROT 6.5 5.1*  ALBUMIN 3.0* 2.4*   Recent Labs  Lab 07/06/19 1149  LIPASE 45   No results for input(s): AMMONIA in the last 168 hours. Coagulation Profile: No results for input(s): INR, PROTIME in the last 168 hours. Cardiac Enzymes: No results for input(s): CKTOTAL, CKMB, CKMBINDEX, TROPONINI in the last 168 hours. BNP (last 3 results) No results for input(s): PROBNP in the last 8760 hours. HbA1C: No results for input(s): HGBA1C in the last 72 hours. CBG: No results for input(s): GLUCAP in the last 168 hours. Lipid Profile: No results for input(s): CHOL, HDL, LDLCALC, TRIG, CHOLHDL, LDLDIRECT in the last 72 hours. Thyroid Function Tests: No results for input(s): TSH, T4TOTAL, FREET4, T3FREE, THYROIDAB in the last 72 hours. Anemia Panel: Recent Labs    07/07/19 0541  VITAMINB12 662  FOLATE 6.0  FERRITIN 124  TIBC 236*  IRON 46   Sepsis Labs: No results for input(s): PROCALCITON, LATICACIDVEN in the last 168 hours.  Recent Results (from the past 240 hour(s))  SARS CORONAVIRUS 2 (TAT 6-24 HRS) Nasopharyngeal Nasopharyngeal Swab     Status: None   Collection Time: 07/06/19 12:33 PM   Specimen:  Nasopharyngeal Swab  Result Value Ref Range Status   SARS Coronavirus 2 NEGATIVE NEGATIVE Final    Comment: (NOTE) SARS-CoV-2 target nucleic acids are NOT DETECTED. The SARS-CoV-2 RNA is generally detectable in upper and lower respiratory specimens during the acute phase of infection. Negative results do not preclude SARS-CoV-2 infection, do not rule out co-infections with other pathogens, and should not be used as the sole basis for treatment or other patient management decisions. Negative results must be combined with clinical observations, patient history, and epidemiological information. The expected result is Negative. Fact Sheet for Patients: SugarRoll.be Fact Sheet for Healthcare Providers: https://www.woods-mathews.com/ This test is not yet approved or cleared by the [place] FDA and  has been authorized for detection and/or diagnosis of SARS-CoV-2 by FDA under an Emergency Use Authorization (EUA). This EUA will remain  in effect (meaning this  test can be used) for the duration of the COVID-19 declaration under Section 56 4(b)(1) of the Act, 21 U.S.C. section 360bbb-3(b)(1), unless the authorization is terminated or revoked sooner. Performed at [facility], [address] [place], [place] [address]   Culture, blood (routine x 2)     Status: None (Preliminary result)   Collection Time: 07/06/19  4:28 PM   Specimen: BLOOD  Result Value Ref Range Status   Specimen Description   Final    BLOOD RIGHT ANTECUBITAL Performed at [facility], [address] [place], [place] [address]    Special Requests   Final    BOTTLES DRAWN AEROBIC AND ANAEROBIC Blood Culture adequate volume Performed at [facility], [address] [place], [place] [address]    Culture   Final    NO GROWTH < 24 HOURS Performed at [facility], [address], [place], [place] [address]    Report Status PENDING   Incomplete  Culture, blood (routine x 2)     Status: None (Preliminary result)   Collection Time: 07/06/19  4:28 PM   Specimen: BLOOD  Result Value Ref Range Status   Specimen Description   Final    BLOOD LEFT ANTECUBITAL Performed at [facility], [address] [place], [place] [address]    Special Requests   Final    BOTTLES DRAWN AEROBIC AND ANAEROBIC Blood Culture adequate volume Performed at [facility], [address], [place], [place] [address]    Culture   Final    NO GROWTH < 24 HOURS Performed at [facility], [address] [place], [place] [address]    Report Status PENDING  Incomplete         Radiology Studies: US Renal  Result Date: 07/06/2019 CLINICAL DATA:  Acute kidney injury EXAM: RENAL / URINARY TRACT ULTRASOUND COMPLETE COMPARISON:  Renal ultrasound 12/08/2014 FINDINGS: Right Kidney: Renal measurements: 11.7 x 5.6 x 5.1 cm = volume: 156 mL . Echogenicity within normal limits. No mass or hydronephrosis visualized. Left Kidney: Renal measurements: 10.8 x 6.0 x 4.8 cm = volume: 161 mL. Echogenicity within normal limits. No mass or hydronephrosis visualized. Bladder: Appears normal for degree of bladder distention. IMPRESSION: Negative renal ultrasound. Electronically Signed   By: Marlan Palau M.D.   On: 07/06/2019 15:15        Scheduled Meds:  Chlorhexidine Gluconate Cloth  6 each Topical Daily   divalproex  500 mg Oral BID   donepezil  10 mg Oral QHS   enoxaparin (LOVENOX) injection  30 mg Subcutaneous Q24H   finasteride  5 mg Oral q morning - 10a   lamoTRIgine  100 mg Oral BID   memantine  10 mg Oral BID   Continuous Infusions:  sodium chloride 100 mL/hr at 07/07/19 0848   cefTRIAXone (ROCEPHIN)  IV 1 g (07/07/19 1137)     LOS: 1 day    Time spent: 35 minutes    Ramiro Harvest, MD [facility]  If 7PM-7AM, please contact night-coverage [email or web address] 07/07/2019, 5:06 PM

## 2019-07-08 LAB — BASIC METABOLIC PANEL
Anion gap: 8 (ref 5–15)
BUN: 26 mg/dL — ABNORMAL HIGH (ref 8–23)
CO2: 21 mmol/L — ABNORMAL LOW (ref 22–32)
Calcium: 8.3 mg/dL — ABNORMAL LOW (ref 8.9–10.3)
Chloride: 109 mmol/L (ref 98–111)
Creatinine, Ser: 1.18 mg/dL (ref 0.61–1.24)
GFR calc Af Amer: >60 mL/min (ref >60)
GFR calc non Af Amer: 55 mL/min — ABNORMAL LOW (ref >60)
Glucose, Bld: 82 mg/dL (ref 70–99)
Potassium: 3.7 mmol/L (ref 3.5–5.1)
Sodium: 138 mmol/L (ref 135–145)

## 2019-07-08 LAB — CBC WITH DIFFERENTIAL/PLATELET
Abs Immature Granulocytes: 0.45 10*3/uL — ABNORMAL HIGH (ref 0.00–0.07)
Basophils Absolute: 0.1 10*3/uL (ref 0.0–0.1)
Basophils Relative: 1 %
Eosinophils Absolute: 0 10*3/uL (ref 0.0–0.5)
Eosinophils Relative: 0 %
HCT: 33.9 % — ABNORMAL LOW (ref 39.0–52.0)
Hemoglobin: 10.4 g/dL — ABNORMAL LOW (ref 13.0–17.0)
Immature Granulocytes: 8 %
Lymphocytes Relative: 17 %
Lymphs Abs: 0.9 10*3/uL (ref 0.7–4.0)
MCH: 32 pg (ref 26.0–34.0)
MCHC: 30.7 g/dL (ref 30.0–36.0)
MCV: 104.3 fL — ABNORMAL HIGH (ref 80.0–100.0)
Monocytes Absolute: 0.4 10*3/uL (ref 0.1–1.0)
Monocytes Relative: 7 %
Neutro Abs: 3.7 10*3/uL (ref 1.7–7.7)
Neutrophils Relative %: 67 %
Platelets: 109 10*3/uL — ABNORMAL LOW (ref 150–400)
RBC: 3.25 MIL/uL — ABNORMAL LOW (ref 4.22–5.81)
RDW: 13.9 % (ref 11.5–15.5)
WBC: 5.6 10*3/uL (ref 4.0–10.5)
nRBC: 0 % (ref 0.0–0.2)

## 2019-07-08 LAB — MAGNESIUM: Magnesium: 2.1 mg/dL (ref 1.7–2.4)

## 2019-07-08 NOTE — Evaluation (Signed)
Physical Therapy Evaluation Patient Details Name: Zachary Ali MRN: [id number] DOB: 06-03-1929 Today's Date: 07/08/2019   History of Present Illness  Zachary Ali is a 85 y.o. male with medical history significant of dementia, recurrent UTI, PMH of ,seizure D/O,SVT,BPH. Patient had Nausea, slid from [facility]. Lives at home with wife  Clinical Impression  The patient frequently asking for wife and to go home.. Max assistance required to sit patient at the edge of bed and to return to return to supine. Unable to stand as  Needed 2 persons(unavailable).Unsure of Prior level of function. Patient did provide that he plays violin and wife is a "fine" pianist and that the cello has a nice sound.Pt admitted with above diagnosis.  Pt currently with functional limitations due to the deficits listed below (see PT Problem List). Pt will benefit from skilled PT to increase their independence and safety with mobility to allow discharge to the venue listed below.       Follow Up Recommendations SNF;Home health PT(depends on caregiver capability.)    Equipment Recommendations  None recommended by PT    Recommendations for Other Services       Precautions / Restrictions Precautions Precautions: Fall Restrictions Weight Bearing Restrictions: No      Mobility  Bed Mobility Overal bed mobility: Needs Assistance Bed Mobility: Supine to Sit;Sit to Supine     Supine to sit: Max assist;HOB elevated Sit to supine: Max assist   General bed mobility comments: assist with legs and trunk, bed pad used to scoot forward.  Transfers                 General transfer comment: NT, required 2 assist, not avaialble.  Ambulation/Gait                Stairs            Wheelchair Mobility    Modified Rankin (Stroke Patients Only)       Balance Overall balance assessment: History of Falls;Needs assistance Sitting-balance support: Feet supported;Bilateral upper extremity supported Sitting  balance-Leahy Scale: Fair Sitting balance - Comments: once to bed edge, sat with min guard.                                     Pertinent Vitals/Pain Pain Assessment: Faces Faces Pain Scale: Hurts a little bit Pain Location: unable to state - grimaces with repositioning Pain Descriptors / Indicators: Grimacing Pain Intervention(s): Monitored during session;Repositioned    Home Living Family/patient expects to be discharged to:: Private residence Living Arrangements: Spouse/significant other Available Help at Discharge: Family Type of Home: House Home Access: Stairs to enter Entrance Stairs-Rails: Can reach both Entrance Stairs-Number of Steps: 3 Home Layout: One level Home Equipment: Cane - single point Additional Comments: unsure of Prior level of function/ note refers to [place]. Pt. unable to provide    Prior Function           Comments: unsure of PLOF     Hand Dominance        Extremity/Trunk Assessment   Upper Extremity Assessment Upper Extremity Assessment: Generalized weakness;Difficult to assess due to impaired cognition    Lower Extremity Assessment Lower Extremity Assessment: Generalized weakness    Cervical / Trunk Assessment Cervical / Trunk Assessment: Kyphotic  Communication   Communication: Expressive difficulties;Receptive difficulties  Cognition Arousal/Alertness: Awake/alert Behavior During Therapy: Restless;Anxious Overall Cognitive Status: History of cognitive impairments - at  baseline                                 General Comments: pt. unable to express self, repeats what PT states at times.      General Comments      Exercises     Assessment/Plan    PT Assessment Patient needs continued PT services  PT Problem List Decreased strength;Decreased mobility;Decreased safety awareness;Decreased coordination;Decreased knowledge of precautions;Decreased activity tolerance;Decreased cognition;Decreased  balance;Decreased knowledge of use of DME;Pain       PT Treatment Interventions DME instruction;Therapeutic activities;Therapeutic exercise;Cognitive remediation;Functional mobility training;Balance training;Patient/family education    PT Goals (Current goals can be found in the Care Plan section)  Acute Rehab PT Goals Patient Stated Goal: pt. unable PT Goal Formulation: Patient unable to participate in goal setting Time For Goal Achievement: 07/22/19 Potential to Achieve Goals: Fair    Frequency Min 2X/week   Barriers to discharge   unsure if wife can mange at current level    Co-evaluation               AM-PAC PT "6 Clicks" Mobility  Outcome Measure Help needed turning from your back to your side while in a flat bed without using bedrails?: Total Help needed moving from lying on your back to sitting on the side of a flat bed without using bedrails?: Total Help needed moving to and from a bed to a chair (including a wheelchair)?: Total Help needed standing up from a chair using your arms (e.g., wheelchair or bedside chair)?: Total Help needed to walk in hospital room?: Total Help needed climbing 3-5 steps with a railing? : Total 6 Click Score: 6    End of Session   Activity Tolerance: Patient tolerated treatment well Patient left: in bed;with call bell/phone within reach;with bed alarm set Nurse Communication: Mobility status PT Visit Diagnosis: Unsteadiness on feet (R26.81);Difficulty in walking, not elsewhere classified (R26.2);Repeated falls (R29.6)    Time: 1941-7408 PT Time Calculation (min) (ACUTE ONLY): 21 min   Charges:   PT Evaluation $PT Eval Low Complexity: 1 Low          Blanchard Kelch PT Acute Rehabilitation Services Pager [phone] Office [phone]   Rada Hay 07/08/2019, 1:07 PM

## 2019-07-08 NOTE — Progress Notes (Signed)
PROGRESS NOTE    Zachary Ali  ITG:[id number] DOB: 11-28-1928 DOA: 07/06/2019 PCP: Georgann Housekeeper, MD     Brief Narrative:  Zachary Ali is a 85 y.o.malewith medical history significant ofdementia, recurrent UTI. Per wife, she reports that the patient has not felt well for the last 3 to 4 days. She says that he has been nauseous and not wanting to eat. He say said on several occasions that he is "hurting" but does not tell her where. She noted that yesterday and this morning he was vomiting. She does not note any fevers or chills. When awaking him this morning, he apparently slipped in his wheelchair and appeared weak. She became concerned and sent him to the ED.Lab work on admission revealed AKI and UTI.  New events last 24 hours / Subjective: No complaints on my examination today.  Denies any nausea or vomiting, states that he feels well overall.  Assessment & Plan:   Principal Problem:   Sepsis secondary to UTI [facility]) Active Problems:   Altered mental state   UTI (urinary tract infection)   Acute renal failure (HCC)   AKI (acute kidney injury) (HCC)   Nausea and vomiting   Dementia without behavioral disturbance (HCC)   Abdominal pain   Acute metabolic encephalopathy   Sepsis likely secondary to UTI Patient admitted with criteria meeting sepsis with acute kidney injury, afebrile, slight leukocytosis, noted to be hypotensive on admission, urinalysis concerning for UTI. Started on empiric IV Rocephin. Urine culture pending at this time.  AKI Likely secondary to a prerenal azotemia in the setting of diuretics, nausea and vomiting with GI losses and decreased oral intake. Renal ultrasound negative for hydronephrosis. Renal function trending down/improving with hydration. Resolved.   Dementia without behavioral disturbance Continue home regimen of Depakote, Aricept, Lamictal, Namenda.  Acute metabolic encephalopathy Likely secondary to problem #1 in the setting of underlying  dementia.  Abdominal pain Resolved       DVT prophylaxis: Lovenox Code Status: DNR Family Communication: Spoke with wife over the phone this morning Disposition Plan: Mentation seems back to baseline.  Patient is still receiving IV antibiotics, awaiting urine culture and blood culture results at this time due to sepsis on admission.  PT OT evaluation still pending as well for disposition planning, per wife patient has significant lower leg weakness and may need rehab    Consultants:   None  Procedures:   None  Antimicrobials:  Anti-infectives (From admission, onward)   Start     Dose/Rate Route Frequency Ordered Stop   07/07/19 1200  cefTRIAXone (ROCEPHIN) 1 g in sodium chloride 0.9 % 100 mL IVPB     1 g 200 mL/hr over 30 Minutes Intravenous Every 24 hours 07/06/19 1904     07/06/19 1245  cefTRIAXone (ROCEPHIN) 1 g in sodium chloride 0.9 % 100 mL IVPB     1 g 200 mL/hr over 30 Minutes Intravenous  Once 07/06/19 1233 07/06/19 1403        Objective: Vitals:   07/07/19 1342 07/07/19 2039 07/08/19 0459 07/08/19 0500  BP: (!) 98/50 (!) 132/56 129/69   Pulse: 69 71 70   Resp: 18 16 16    Temp: (!) 97.2 F (36.2 C) 97.7 F (36.5 C) 97.9 F (36.6 C)   TempSrc:  Oral Oral   SpO2: 95% 98% 97%   Weight:    37.9 kg  Height:        Intake/Output Summary (Last 24 hours) at 07/08/2019 1041 Last data filed  at 07/08/2019 0500 Gross per 24 hour  Intake 1392.88 ml  Output 750 ml  Net 642.88 ml   Filed Weights   07/08/19 0500  Weight: 37.9 kg    Examination:  General exam: Appears calm and comfortable  Respiratory system: Clear to auscultation. Respiratory effort normal. No respiratory distress. No conversational dyspnea.  Cardiovascular system: S1 & S2 heard, RRR. No murmurs. No pedal edema. Gastrointestinal system: Abdomen is nondistended, soft and nontender. Normal bowel sounds heard. Central nervous system: Alert. No focal neurological deficits Extremities:  Symmetric in appearance  Skin: No rashes, lesions or ulcers on exposed skin  Psychiatry: Dementia   Data Reviewed: I have personally reviewed following labs and imaging studies  CBC: Recent Labs  Lab 07/06/19 1149 07/06/19 1628 07/07/19 0541 07/08/19 0509  WBC 10.9* 8.4 11.9* 5.6  NEUTROABS 8.3*  --   --  3.7  HGB 12.7* 11.6* 10.5* 10.4*  HCT 39.2 35.9* 32.1* 33.9*  MCV 99.7 100.3* 102.2* 104.3*  PLT 130* 125* 122* 779*   Basic Metabolic Panel: Recent Labs  Lab 07/06/19 1149 07/06/19 1628 07/07/19 0541 07/08/19 0509  NA 134*  --  137 138  K 3.8  --  3.3* 3.7  CL 95*  --  104 109  CO2 26  --  24 21*  GLUCOSE 104*  --  88 82  BUN 44*  --  34* 26*  CREATININE 1.63* 1.44* 1.31* 1.18  CALCIUM 8.8*  --  8.0* 8.3*  MG  --   --   --  2.1   GFR: Estimated Creatinine Clearance: 23.2 mL/min (by C-G formula based on SCr of 1.18 mg/dL). Liver Function Tests: Recent Labs  Lab 07/06/19 1149 07/07/19 0541  AST 34 18  ALT 29 23  ALKPHOS 109 80  BILITOT 1.2 0.7  PROT 6.5 5.1*  ALBUMIN 3.0* 2.4*   Recent Labs  Lab 07/06/19 1149  LIPASE 45   No results for input(s): AMMONIA in the last 168 hours. Coagulation Profile: No results for input(s): INR, PROTIME in the last 168 hours. Cardiac Enzymes: No results for input(s): CKTOTAL, CKMB, CKMBINDEX, TROPONINI in the last 168 hours. BNP (last 3 results) No results for input(s): PROBNP in the last 8760 hours. HbA1C: No results for input(s): HGBA1C in the last 72 hours. CBG: No results for input(s): GLUCAP in the last 168 hours. Lipid Profile: No results for input(s): CHOL, HDL, LDLCALC, TRIG, CHOLHDL, LDLDIRECT in the last 72 hours. Thyroid Function Tests: No results for input(s): TSH, T4TOTAL, FREET4, T3FREE, THYROIDAB in the last 72 hours. Anemia Panel: Recent Labs    07/07/19 0541  VITAMINB12 662  FOLATE 6.0  FERRITIN 124  TIBC 236*  IRON 46   Sepsis Labs: No results for input(s): PROCALCITON, LATICACIDVEN in  the last 168 hours.  Recent Results (from the past 240 hour(s))  SARS CORONAVIRUS 2 (TAT 6-24 HRS) Nasopharyngeal Nasopharyngeal Swab     Status: None   Collection Time: 07/06/19 12:33 PM   Specimen: Nasopharyngeal Swab  Result Value Ref Range Status   SARS Coronavirus 2 NEGATIVE NEGATIVE Final    Comment: (NOTE) SARS-CoV-2 target nucleic acids are NOT DETECTED. The SARS-CoV-2 RNA is generally detectable in upper and lower respiratory specimens during the acute phase of infection. Negative results do not preclude SARS-CoV-2 infection, do not rule out co-infections with other pathogens, and should not be used as the sole basis for treatment or other patient management decisions. Negative results must be combined with clinical observations, patient history,  and epidemiological information. The expected result is Negative. Fact Sheet for Patients: HairSlick.no Fact Sheet for Healthcare Providers: quierodirigir.com This test is not yet approved or cleared by the [place] FDA and  has been authorized for detection and/or diagnosis of SARS-CoV-2 by FDA under an Emergency Use Authorization (EUA). This EUA will remain  in effect (meaning this test can be used) for the duration of the COVID-19 declaration under Section 56 4(b)(1) of the Act, 21 U.S.C. section 360bbb-3(b)(1), unless the authorization is terminated or revoked sooner. Performed at [facility], [address] [place], [place] [address]   Culture, blood (routine x 2)     Status: None (Preliminary result)   Collection Time: 07/06/19  4:28 PM   Specimen: BLOOD  Result Value Ref Range Status   Specimen Description   Final    BLOOD RIGHT ANTECUBITAL Performed at [facility], [address], [place], [place] [address]    Special Requests   Final    BOTTLES DRAWN AEROBIC AND ANAEROBIC Blood Culture adequate volume Performed at [facility], [address] [place], [place] [address]    Culture   Final    NO GROWTH < 24 HOURS Performed at [facility], [address] [place], [place] [address]    Report Status PENDING  Incomplete  Culture, blood (routine x 2)     Status: None (Preliminary result)   Collection Time: 07/06/19  4:28 PM   Specimen: BLOOD  Result Value Ref Range Status   Specimen Description   Final    BLOOD LEFT ANTECUBITAL Performed at [facility], [address] [place], [place] [address]    Special Requests   Final    BOTTLES DRAWN AEROBIC AND ANAEROBIC Blood Culture adequate volume Performed at [facility], [address], [place], [place] [address]    Culture   Final    NO GROWTH < 24 HOURS Performed at [facility], [address] [place], [place] [address]    Report Status PENDING  Incomplete      Radiology Studies: US Renal  Result Date: 07/06/2019 CLINICAL DATA:  Acute kidney injury EXAM: RENAL / URINARY TRACT ULTRASOUND COMPLETE COMPARISON:  Renal ultrasound 12/08/2014 FINDINGS: Right Kidney: Renal measurements: 11.7 x 5.6 x 5.1 cm = volume: 156 mL . Echogenicity within normal limits. No mass or hydronephrosis visualized. Left Kidney: Renal measurements: 10.8 x 6.0 x 4.8 cm = volume: 161 mL. Echogenicity within normal limits. No mass or hydronephrosis visualized. Bladder: Appears normal for degree of bladder distention. IMPRESSION: Negative renal ultrasound. Electronically Signed   By: Marlan Palau M.D.   On: 07/06/2019 15:15      Scheduled Meds: . Chlorhexidine Gluconate Cloth  6 each Topical Daily  . divalproex  500 mg Oral BID  . donepezil  10 mg Oral QHS  . enoxaparin (LOVENOX) injection  30 mg Subcutaneous Q24H  . finasteride  5 mg Oral q morning - 10a  . lamoTRIgine  100 mg Oral BID  . memantine  10 mg Oral BID   Continuous Infusions: . sodium chloride 100 mL/hr at 07/07/19 0848  . cefTRIAXone (ROCEPHIN)  IV 1 g (07/07/19  1137)     LOS: 2 days      Time spent: 40 minutes   Noralee Stain, DO [facility] [email or web address] 07/08/2019, 10:41 AM

## 2019-07-08 NOTE — Evaluation (Signed)
Occupational Therapy Evaluation Patient Details Name: Zachary Ali MRN: [id number] DOB: 1928/12/10 Today's Date: 07/08/2019    History of Present Illness Zachary Ali is a 85 y.o. male with medical history significant of dementia, recurrent UTI, PMH of ,seizure D/O,SVT,BPH. Patient had Nausea, slid from [facility]. Lives at home with wife   Clinical Impression   Pt admitted with the above diagnoses and presents with below problem list. Pt will benefit from continued acute OT to address the below listed deficits and maximize independence with basic ADLs prior to d/c to venue below. Unclear PLOF with basic ADLs PTA. From chart review: pt lives with spouse, h/o falls, has a w/c. Pt currently mod A +2 s/e for bed mobility, sat EOB for less than 1 minute with min A. Pt noted to be fearful of falling, increased anxiety with bed mobility despite multimodal cues and encouragement. Once pt returned to supine he appeared more relaxed and drifting off to sleep.      Follow Up Recommendations  SNF    Equipment Recommendations  Other (comment)(Defer to next venue)    Recommendations for Other Services       Precautions / Restrictions Precautions Precautions: Fall Restrictions Weight Bearing Restrictions: No      Mobility Bed Mobility Overal bed mobility: Needs Assistance Bed Mobility: Supine to Sit;Sit to Supine     Supine to sit: Max assist;HOB elevated Sit to supine: Max assist   General bed mobility comments: assist with legs and trunk, bed pad used to scoot forward.  Transfers                 General transfer comment: NT, required 2 assist, not avaialble.    Balance Overall balance assessment: History of Falls;Needs assistance Sitting-balance support: Feet supported;Bilateral upper extremity supported Sitting balance-Leahy Scale: Fair Sitting balance - Comments: once to bed edge, sat with min guard.                                   ADL either performed or  assessed with clinical judgement   ADL Overall ADL's : Needs assistance/impaired Eating/Feeding: Maximal assistance;Bed level;Cueing for compensatory techinques Eating/Feeding Details (indicate cue type and reason): per SLP note pt pockets food Grooming: Sitting;Maximal assistance   Upper Body Bathing: Total assistance;Sitting Upper Body Bathing Details (indicate cue type and reason): sat EOB with mod A, would be total A for bathing tasks due to cognition and poor siting balance Lower Body Bathing: +2 for physical assistance;Maximal assistance;Bed level   Upper Body Dressing : Total assistance;Sitting;Maximal assistance Upper Body Dressing Details (indicate cue type and reason): sat EOB with mod A, would be total A for bathing tasks due to cognition and poor siting balance Lower Body Dressing: +2 for physical assistance;Maximal assistance;Bed level                 General ADL Comments: Pt completed bed mobility with multimodal cues given and ultimately +2 assist. Pt noted to be fearful of falling, anxious though he calmed down a bit once in seated position EOB with +1-2 assist     Vision Baseline Vision/History: Wears glasses       Perception     Praxis      Pertinent Vitals/Pain Pain Assessment: Faces Faces Pain Scale: Hurts a little bit Pain Location: unable to state - grimaces with repositioning Pain Descriptors / Indicators: Grimacing Pain Intervention(s): Monitored during session;Repositioned  Hand Dominance     Extremity/Trunk Assessment Upper Extremity Assessment Upper Extremity Assessment: Generalized weakness;Difficult to assess due to impaired cognition   Lower Extremity Assessment Lower Extremity Assessment: Generalized weakness   Cervical / Trunk Assessment Cervical / Trunk Assessment: Kyphotic   Communication Communication Communication: Expressive difficulties;Receptive difficulties   Cognition Arousal/Alertness: Awake/alert Behavior During  Therapy: Restless;Anxious Overall Cognitive Status: History of cognitive impairments - at baseline                                 General Comments: pt. unable to express self, repeats what PT states at times.   General Comments       Exercises     Shoulder Instructions      Home Living Family/patient expects to be discharged to:: Private residence Living Arrangements: Spouse/significant other Available Help at Discharge: Family Type of Home: House Home Access: Stairs to enter Technical brewer of Steps: 3 Entrance Stairs-Rails: Can reach both Home Layout: One level     Bathroom Shower/Tub: [place]: Kasandra Knudsen - single point   Additional Comments: unsure of Prior level of function/ note refers to [place]. Pt. unable to provide  Lives With: Spouse    Prior Functioning/Environment          Comments: unsure of PLOF        OT Problem List: Decreased strength;Decreased activity tolerance;Impaired balance (sitting and/or standing);Decreased cognition;Decreased safety awareness;Decreased knowledge of use of DME or AE;Decreased knowledge of precautions;Pain      OT Treatment/Interventions: Energy conservation;DME and/or AE instruction;Therapeutic activities;Patient/family education;Balance training;Self-care/ADL training    OT Goals(Current goals can be found in the care plan section) Acute Rehab OT Goals Patient Stated Goal: pt. unable Time For Goal Achievement: 07/22/19 Potential to Achieve Goals: Fair ADL Goals Pt Will Perform Eating: with mod assist Pt Will Perform Grooming: with mod assist;sitting Pt Will Perform Upper Body Bathing: with mod assist;sitting Pt Will Perform Lower Body Bathing: with max assist Pt Will Transfer to Toilet: stand pivot transfer;bedside commode;with max assist  OT Frequency: Min 2X/week   Barriers to D/C:            Co-evaluation              AM-PAC OT "6 Clicks" Daily Activity      Outcome Measure Help from another person eating meals?: A Lot Help from another person taking care of personal grooming?: A Lot Help from another person toileting, which includes using toliet, bedpan, or urinal?: Total Help from another person bathing (including washing, rinsing, drying)?: Total Help from another person to put on and taking off regular upper body clothing?: A Lot Help from another person to put on and taking off regular lower body clothing?: Total 6 Click Score: 9   End of Session    Activity Tolerance: Other (comment)(Anxious with bed mobility, fearful of falling) Patient left: in bed;with bed alarm set;with call bell/phone within reach  OT Visit Diagnosis: Unsteadiness on feet (R26.81);Muscle weakness (generalized) (M62.81);Other abnormalities of gait and mobility (R26.89);History of falling (Z91.81);Pain;Other symptoms and signs involving cognitive function                Time: 6962-9528 OT Time Calculation (min): 16 min Charges:  OT General Charges $OT Visit: 1 Visit OT Evaluation $OT Eval Low Complexity: [place], OT Acute Rehabilitation Services Pager: [phone] Office: [phone]  Pilar Grammes 07/08/2019, 1:07 PM

## 2019-07-08 NOTE — Progress Notes (Signed)
  Speech Language Pathology Treatment: Dysphagia  Patient Details Name: Zachary Ali MRN: [id number] DOB: 06-22-1929 Today's Date: 07/08/2019 Time: 1410-1420 SLP Time Calculation (min) (ACUTE ONLY): 10 min  Assessment / Plan / Recommendation Clinical Impression  Pt seen at bedside for follow up after limited BSE completed yesterday. Pt continues to accept only liquids from me, and does not exhibit overt s/s aspiration with this consistency. RN reports pt does well with self feeding, requiring gentle encouragement and reminders only. Per RN pt is tolerating current diet and meds without overt s/s aspiration. Pt is afebrile and lungs are CTA per chart review. ST to sign off at this time. Please reconsult if needs arise.   HPI HPI: 85yo male admitted 07/06/2019 with N/V, recent falls, weakness, "just hasn't been himself". PMH: dementia, cerebral hemorrhage, CVA, PNA      SLP Plan  DC ST       Recommendations  Diet recommendations: Thin liquid;Dysphagia 3 (mechanical soft) Liquids provided via: Straw;Cup Medication Administration: (as tolerated) Supervision: Staff to assist with self feeding;Full supervision/cueing for compensatory strategies Compensations: Minimize environmental distractions;Slow rate;Small sips/bites Postural Changes and/or Swallow Maneuvers: Seated upright 90 degrees;Upright 30-60 min after meal                Oral Care Recommendations: Oral care BID Follow up Recommendations: 24 hour supervision/assistance;Skilled Nursing facility SLP Visit Diagnosis: Dysphagia, unspecified (R13.10) Plan: Continue with current plan of care       [place]. [facility] [facility] [facility], CCC-SLP Speech Language Pathologist [phone]  Shonna Chock 07/08/2019, 2:23 PM

## 2019-07-09 LAB — BASIC METABOLIC PANEL
Anion gap: 8 (ref 5–15)
BUN: 20 mg/dL (ref 8–23)
CO2: 21 mmol/L — ABNORMAL LOW (ref 22–32)
Calcium: 8.3 mg/dL — ABNORMAL LOW (ref 8.9–10.3)
Chloride: 109 mmol/L (ref 98–111)
Creatinine, Ser: 1.05 mg/dL (ref 0.61–1.24)
GFR calc Af Amer: >60 mL/min (ref >60)
GFR calc non Af Amer: >60 mL/min (ref >60)
Glucose, Bld: 84 mg/dL (ref 70–99)
Potassium: 3.5 mmol/L (ref 3.5–5.1)
Sodium: 138 mmol/L (ref 135–145)

## 2019-07-09 NOTE — TOC Initial Note (Addendum)
Transition of Care [facility]) - Initial/Assessment Note    Patient Details  Name: Zachary Ali MRN: [id number] Date of Birth: 02/01/1929  Transition of Care [facility]) CM/SW Contact:    Nelwyn Salisbury, LCSW Phone Number: [phone] 07/09/2019, 10:28 AM  Clinical Narrative:  Pt admitted with sepsis, likely UTI. Lives at home with his wife and they have private caregivers. Pt has dementia- at baseline is oriented to person, at times place/situation, and able to follow directions. Per wife and caregiver, since UTI, became more confused and not redirectable, unable to ambulate/transfer independently like he does at baseline.  Discussed SNF recommendation with them- in agreement to pursue short term rehab. Pt has been to SNF for rehab in the past and wife familiar with process.  CSW made referrals and following up with bed offers. Will initiate [employer] prior authorization request for pt's BCBS Medicare.   14:58 update: Pt's wife going over bed offers with help of caregiver. Once selection made, will update insurance prior auth request.                   Expected Discharge Plan: Skilled Nursing Facility Barriers to Discharge: English as a second language teacher, Continued Medical Work up, SNF Pending bed offer   Patient Goals and CMS Choice Patient states their goals for this hospitalization and ongoing recovery are:: wife: "he was walking well before the UTI but since then has been very weak" CMS Medicare.gov Compare Post Acute Care list provided to:: (wife) Choice offered to / list presented to : Spouse  Expected Discharge Plan and Services Expected Discharge Plan: Skilled Nursing Facility In-house Referral: Clinical Social Work   Post Acute Care Choice: Skilled Nursing Facility Living arrangements for the past 2 months: Single Family Home                                      Prior Living Arrangements/Services Living arrangements for the past 2 months: Single Family Home Lives with::  Spouse Patient language and need for interpreter reviewed:: No Do you feel safe going back to the place where you live?: Yes      Need for Family Participation in Patient Care: Yes (Comment)(wife, pt with dementia) Care giver support system in place?: Yes (comment)(wife and private caregivers) Current home services: (caregiving aides) Criminal Activity/Legal Involvement Pertinent to Current Situation/Hospitalization: No - Comment as needed  Activities of Daily Living Home Assistive Devices/Equipment: Eyeglasses, Environmental consultant (specify type), Wheelchair ADL Screening (condition at time of admission) Patient's cognitive ability adequate to safely complete daily activities?: No Is the patient deaf or have difficulty hearing?: Yes(rt ear) Does the patient have difficulty seeing, even when wearing glasses/contacts?: No Does the patient have difficulty concentrating, remembering, or making decisions?: Yes Patient able to express need for assistance with ADLs?: No Does the patient have difficulty dressing or bathing?: Yes Independently performs ADLs?: No Communication: Appropriate for developmental age Dressing (OT): Needs assistance, Dependent Is this a change from baseline?: Pre-admission baseline Grooming: Dependent Is this a change from baseline?: Pre-admission baseline Feeding: Dependent Is this a change from baseline?: Pre-admission baseline Bathing: Dependent Is this a change from baseline?: Pre-admission baseline Toileting: Dependent Is this a change from baseline?: Pre-admission baseline In/Out Bed: Dependent Is this a change from baseline?: Pre-admission baseline Walks in Home: Dependent Is this a change from baseline?: Pre-admission baseline Does the patient have difficulty walking or climbing stairs?: Yes Weakness of Legs: Both Weakness of  Arms/Hands: Both  Permission Sought/Granted Permission sought to share information with : Family Supports    Share Information with NAME:  Durenda Guthrie, wife [phone]           Emotional Assessment Appearance:: Appears stated age     Orientation: : Oriented to Self Alcohol / Substance Use: Not Applicable Psych Involvement: No (comment)  Admission diagnosis:  Lower urinary tract infectious disease [N39.0] AKI (acute kidney injury) ([place]) [N17.9] Bilious vomiting, presence of nausea not specified [R11.14] Patient Active Problem List   Diagnosis Date Noted  . Sepsis secondary to UTI ([place]) 07/07/2019  . Acute metabolic encephalopathy 99/77/4142  . AKI (acute kidney injury) ([place]) 07/06/2019  . Nausea and vomiting 07/06/2019  . Dementia without behavioral disturbance ([place]) 07/06/2019  . Abdominal pain 07/06/2019  . Memory loss 09/17/2018  . Mild cognitive impairment 09/03/2017  . Gait abnormality 09/03/2017  . Seizure disorder ([place]) 08/28/2016  . UTI (urinary tract infection) 12/08/2014  . Acute renal failure ([place]) 12/08/2014  . Urinary retention 12/08/2014  . Thrombocytopenia ([place]) 12/08/2014  . Anemia 12/08/2014  . Lung nodule 12/08/2014  . Fall at home 12/08/2014  . Diarrhea 12/08/2014  . UTI (lower urinary tract infection) 12/08/2014  . Altered mental state 06/25/2014  . CVA (cerebral infarction) 06/25/2014  . 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 Benign prostate hyperplasia 03/03/2012  . Elevated prostate specific antigen (PSA) 09/03/2011  . Urinary urgency 09/03/2011   PCP:  Wenda Low, MD Pharmacy:   [facility], [place] [address] Phone: [phone] Fax: [phone]     Social Determinants of Health (SDOH) Interventions    Readmission Risk Interventions No flowsheet data found.

## 2019-07-09 NOTE — Progress Notes (Signed)
PROGRESS NOTE    Zachary Ali  ZPH:[id number] DOB: 07-31-1929 DOA: 07/06/2019 PCP: Georgann Housekeeper, MD     Brief Narrative:  Zachary Ali is a 84 y.o.malewith medical history significant ofdementia, recurrent UTI. Per wife, she reports that the patient has not felt well for the last 3 to 4 days. She says that he has been nauseous and not wanting to eat. He say said on several occasions that he is "hurting" but does not tell her where. She noted that yesterday and this morning he was vomiting. She does not note any fevers or chills. When awaking him this morning, he apparently slipped in his wheelchair and appeared weak. She became concerned and sent him to the ED.Lab work on admission revealed AKI and UTI.  New events last 24 hours / Subjective: Pleasantly confused, no complaints or acute events.   Assessment & Plan:   Principal Problem:   Sepsis secondary to UTI [facility]) Active Problems:   Altered mental state   UTI (urinary tract infection)   Acute renal failure (HCC)   AKI (acute kidney injury) (HCC)   Nausea and vomiting   Dementia without behavioral disturbance (HCC)   Abdominal pain   Acute metabolic encephalopathy   Sepsis likely secondary to UTI Patient admitted with criteria meeting sepsis with acute kidney injury, afebrile, slight leukocytosis, noted to be hypotensive on admission, urinalysis concerning for UTI. Started on empiric IV Rocephin. Urine culture pending at this time.  AKI Likely secondary to a prerenal azotemia in the setting of diuretics, nausea and vomiting with GI losses and decreased oral intake. Renal ultrasound negative for hydronephrosis. Renal function trending down/improving with hydration. Resolved Cr 1.05   Dementia without behavioral disturbance Continue home regimen of Depakote, Aricept, Lamictal, Namenda.  Acute metabolic encephalopathy Likely secondary to problem #1 in the setting of underlying dementia.   Abdominal pain Resolved        DVT prophylaxis: Lovenox Code Status: DNR Family Communication: None  Disposition Plan: Mentation seems back to baseline.  Patient is still receiving IV antibiotics, awaiting urine culture results at this time due to sepsis on admission. SNF placement pending.   Consultants:   None  Procedures:   None  Antimicrobials:  Anti-infectives (From admission, onward)   Start     Dose/Rate Route Frequency Ordered Stop   07/07/19 1200  cefTRIAXone (ROCEPHIN) 1 g in sodium chloride 0.9 % 100 mL IVPB     1 g 200 mL/hr over 30 Minutes Intravenous Every 24 hours 07/06/19 1904     07/06/19 1245  cefTRIAXone (ROCEPHIN) 1 g in sodium chloride 0.9 % 100 mL IVPB     1 g 200 mL/hr over 30 Minutes Intravenous  Once 07/06/19 1233 07/06/19 1403       Objective: Vitals:   07/08/19 1334 07/08/19 2055 07/09/19 0538 07/09/19 0741  BP: (!) 116/45 134/73 (!) 151/64 140/82  Pulse: 71 77 77   Resp: 18 19 18    Temp: 98.6 F (37 C) 98.4 F (36.9 C) 98.6 F (37 C)   TempSrc: Oral Oral Oral   SpO2: 95% 99% 97%   Weight:   37.4 kg   Height:        Intake/Output Summary (Last 24 hours) at 07/09/2019 1122 Last data filed at 07/09/2019 0819 Gross per 24 hour  Intake 520 ml  Output 1250 ml  Net -730 ml   Filed Weights   07/08/19 0500 07/09/19 0538  Weight: 37.9 kg 37.4 kg    Examination:  General exam: Appears calm and comfortable  Respiratory system: Clear to auscultation. Respiratory effort normal. Cardiovascular system: S1 & S2 heard, RRR. No JVD, murmurs, rubs, gallops or clicks. No pedal edema. Gastrointestinal system: Abdomen is nondistended, soft and nontender. No organomegaly or masses felt. Normal bowel sounds heard. Central nervous system: Alert. No focal neurological deficits. Extremities: Symmetric  Skin: No rashes, lesions or ulcers Psychiatry: Dementia    Data Reviewed: I have personally reviewed following labs and imaging studies  CBC: Recent Labs  Lab 07/06/19 1149  07/06/19 1628 07/07/19 0541 07/08/19 0509  WBC 10.9* 8.4 11.9* 5.6  NEUTROABS 8.3*  --   --  3.7  HGB 12.7* 11.6* 10.5* 10.4*  HCT 39.2 35.9* 32.1* 33.9*  MCV 99.7 100.3* 102.2* 104.3*  PLT 130* 125* 122* 509*   Basic Metabolic Panel: Recent Labs  Lab 07/06/19 1149 07/06/19 1628 07/07/19 0541 07/08/19 0509 07/09/19 0550  NA 134*  --  137 138 138  K 3.8  --  3.3* 3.7 3.5  CL 95*  --  104 109 109  CO2 26  --  24 21* 21*  GLUCOSE 104*  --  88 82 84  BUN 44*  --  34* 26* 20  CREATININE 1.63* 1.44* 1.31* 1.18 1.05  CALCIUM 8.8*  --  8.0* 8.3* 8.3*  MG  --   --   --  2.1  --    GFR: Estimated Creatinine Clearance: 25.7 mL/min (by C-G formula based on SCr of 1.05 mg/dL). Liver Function Tests: Recent Labs  Lab 07/06/19 1149 07/07/19 0541  AST 34 18  ALT 29 23  ALKPHOS 109 80  BILITOT 1.2 0.7  PROT 6.5 5.1*  ALBUMIN 3.0* 2.4*   Recent Labs  Lab 07/06/19 1149  LIPASE 45   No results for input(s): AMMONIA in the last 168 hours. Coagulation Profile: No results for input(s): INR, PROTIME in the last 168 hours. Cardiac Enzymes: No results for input(s): CKTOTAL, CKMB, CKMBINDEX, TROPONINI in the last 168 hours. BNP (last 3 results) No results for input(s): PROBNP in the last 8760 hours. HbA1C: No results for input(s): HGBA1C in the last 72 hours. CBG: No results for input(s): GLUCAP in the last 168 hours. Lipid Profile: No results for input(s): CHOL, HDL, LDLCALC, TRIG, CHOLHDL, LDLDIRECT in the last 72 hours. Thyroid Function Tests: No results for input(s): TSH, T4TOTAL, FREET4, T3FREE, THYROIDAB in the last 72 hours. Anemia Panel: Recent Labs    07/07/19 0541  VITAMINB12 662  FOLATE 6.0  FERRITIN 124  TIBC 236*  IRON 46   Sepsis Labs: No results for input(s): PROCALCITON, LATICACIDVEN in the last 168 hours.  Recent Results (from the past 240 hour(s))  SARS CORONAVIRUS 2 (TAT 6-24 HRS) Nasopharyngeal Nasopharyngeal Swab     Status: None   Collection  Time: 07/06/19 12:33 PM   Specimen: Nasopharyngeal Swab  Result Value Ref Range Status   SARS Coronavirus 2 NEGATIVE NEGATIVE Final    Comment: (NOTE) SARS-CoV-2 target nucleic acids are NOT DETECTED. The SARS-CoV-2 RNA is generally detectable in upper and lower respiratory specimens during the acute phase of infection. Negative results do not preclude SARS-CoV-2 infection, do not rule out co-infections with other pathogens, and should not be used as the sole basis for treatment or other patient management decisions. Negative results must be combined with clinical observations, patient history, and epidemiological information. The expected result is Negative. Fact Sheet for Patients: SugarRoll.be Fact Sheet for Healthcare Providers: https://www.woods-mathews.com/ This test is not yet approved  or cleared by the [place] FDA and  has been authorized for detection and/or diagnosis of SARS-CoV-2 by FDA under an Emergency Use Authorization (EUA). This EUA will remain  in effect (meaning this test can be used) for the duration of the COVID-19 declaration under Section 56 4(b)(1) of the Act, 21 U.S.C. section 360bbb-3(b)(1), unless the authorization is terminated or revoked sooner. Performed at [facility], [address] [place], [place] [address]   Culture, blood (routine x 2)     Status: None (Preliminary result)   Collection Time: 07/06/19  4:28 PM   Specimen: BLOOD  Result Value Ref Range Status   Specimen Description   Final    BLOOD RIGHT ANTECUBITAL Performed at [facility], [address] [place], [place] [address]    Special Requests   Final    BOTTLES DRAWN AEROBIC AND ANAEROBIC Blood Culture adequate volume Performed at [facility], [address] [place], [place] [address]    Culture   Final    NO GROWTH 2 DAYS Performed at [facility], [address] [place], [place] [address]     Report Status PENDING  Incomplete  Culture, blood (routine x 2)     Status: None (Preliminary result)   Collection Time: 07/06/19  4:28 PM   Specimen: BLOOD  Result Value Ref Range Status   Specimen Description   Final    BLOOD LEFT ANTECUBITAL Performed at [facility], [address] [place], [place] [address]    Special Requests   Final    BOTTLES DRAWN AEROBIC AND ANAEROBIC Blood Culture adequate volume Performed at [facility], [address] [place], [place] [address]    Culture   Final    NO GROWTH 2 DAYS Performed at [facility], [address] [place], [place] [address]    Report Status PENDING  Incomplete      Radiology Studies: No results found.    Scheduled Meds: . Chlorhexidine Gluconate Cloth  6 each Topical Daily  . divalproex  500 mg Oral BID  . donepezil  10 mg Oral QHS  . enoxaparin (LOVENOX) injection  30 mg Subcutaneous Q24H  . finasteride  5 mg Oral q morning - 10a  . lamoTRIgine  100 mg Oral BID  . memantine  10 mg Oral BID   Continuous Infusions: . cefTRIAXone (ROCEPHIN)  IV 1 g (07/09/19 1116)     LOS: 3 days      Time spent: 20 minutes   Noralee StainJennifer Samul Mcinroy, DO [facility] [email or web address] 07/09/2019, 11:22 AM

## 2019-07-09 NOTE — [place] FL2 (Signed)
[place] MEDICAID FL2 LEVEL OF CARE SCREENING TOOL     IDENTIFICATION  Patient Name: Zachary Ali Birthdate: 1928-12-05 Sex: male Admission Date (Current Location): 07/06/2019  [facility] and [place] Number:  Producer, television/film/video and Address:  [facility],  [address] [place], [place] [address]      Provider Number: [id number]  Attending Physician Name and Address:  Noralee Stain, DO  Relative Name and Phone Number:  Dellis Voght, wife, [phone]    Current Level of Care: Hospital Recommended Level of Care: Skilled Nursing Facility Prior Approval Number:    Date Approved/Denied:   PASRR Number: [id number]  Discharge Plan: SNF    Current Diagnoses: Patient Active Problem List   Diagnosis Date Noted  . Sepsis secondary to UTI (HCC) 07/07/2019  . Acute metabolic encephalopathy 07/07/2019  . AKI (acute kidney injury) (HCC) 07/06/2019  . Nausea and vomiting 07/06/2019  . Dementia without behavioral disturbance (HCC) 07/06/2019  . Abdominal pain 07/06/2019  . Memory loss 09/17/2018  . Mild cognitive impairment 09/03/2017  . Gait abnormality 09/03/2017  . Seizure disorder (HCC) 08/28/2016  . UTI (urinary tract infection) 12/08/2014  . Acute renal failure (HCC) 12/08/2014  . Urinary retention 12/08/2014  . Thrombocytopenia (HCC) 12/08/2014  . Anemia 12/08/2014  . Lung nodule 12/08/2014  . Fall at home 12/08/2014  . Diarrhea 12/08/2014  . UTI (lower urinary tract infection) 12/08/2014  . Altered mental state 06/25/2014  . CVA (cerebral infarction) 06/25/2014  . 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 Benign prostate hyperplasia 03/03/2012  . Elevated prostate specific antigen (PSA) 09/03/2011  . Urinary urgency 09/03/2011    Orientation RESPIRATION BLADDER Height & Weight     Self, Place  Normal Continent Weight: 82 lb 7.2 oz (37.4 kg) Height:  5\' 8"  (172.7 cm)  BEHAVIORAL SYMPTOMS/MOOD NEUROLOGICAL BOWEL NUTRITION STATUS   Continent Diet(dysphasia III diet)  AMBULATORY STATUS COMMUNICATION OF NEEDS Skin   Extensive Assist Verbally Normal                       Personal Care Assistance Level of Assistance  Bathing, Feeding, Dressing Bathing Assistance: Maximum assistance Feeding assistance: Independent Dressing Assistance: Maximum assistance     Functional Limitations Info  Sight, Hearing, Speech Sight Info: Impaired(uses glasses, hx of cataracts) Hearing Info: Adequate Speech Info: Adequate    SPECIAL CARE FACTORS FREQUENCY  PT (By licensed PT), OT (By licensed OT), Speech therapy     PT Frequency: 5x OT Frequency: 5x     Speech Therapy Frequency: 1x      Contractures Contractures Info: Not present    Additional Factors Info  Code Status, Allergies Code Status Info: DNR Allergies Info: nka           Current Medications (07/09/2019):  This is the current hospital active medication list Current Facility-Administered Medications  Medication Dose Route Frequency Provider Last Rate Last Dose  . acetaminophen (TYLENOL) tablet 650 mg  650 mg Oral Q6H PRN 07/11/2019, Tyrone A, DO   650 mg at 07/08/19 1056   Or  . acetaminophen (TYLENOL) suppository 650 mg  650 mg Rectal Q6H PRN 07/10/19, Tyrone A, DO      . cefTRIAXone (ROCEPHIN) 1 g in sodium chloride 0.9 % 100 mL IVPB  1 g Intravenous Q24H Kyle, Tyrone A, DO 200 mL/hr at 07/08/19 1238 1 g at 07/08/19 1238  . Chlorhexidine Gluconate Cloth 2 % PADS 6 each  6 each Topical Daily 07/10/19,  MD   6 each at 07/09/19 0909  . divalproex (DEPAKOTE) DR tablet 500 mg  500 mg Oral BID Marylyn Ishihara, Tyrone A, DO   500 mg at 07/09/19 0904  . donepezil (ARICEPT) tablet 10 mg  10 mg Oral QHS Kyle, Tyrone A, DO   10 mg at 07/08/19 2201  . enoxaparin (LOVENOX) injection 30 mg  30 mg Subcutaneous Q24H Eugenie Filler, MD   30 mg at 07/09/19 0905  . finasteride (PROSCAR) tablet 5 mg  5 mg Oral q morning - 10a Kyle, Tyrone A, DO   5 mg at 07/09/19 0905  .  lamoTRIgine (LAMICTAL) tablet 100 mg  100 mg Oral BID Marylyn Ishihara, Tyrone A, DO   100 mg at 07/09/19 0905  . memantine (NAMENDA) tablet 10 mg  10 mg Oral BID Marylyn Ishihara, Tyrone A, DO   10 mg at 07/09/19 0905  . ondansetron (ZOFRAN) tablet 4 mg  4 mg Oral Q6H PRN Marylyn Ishihara, Tyrone A, DO       Or  . ondansetron (ZOFRAN) injection 4 mg  4 mg Intravenous Q6H PRN Marylyn Ishihara, Tyrone A, DO      . polyethylene glycol (MIRALAX / GLYCOLAX) packet 17 g  17 g Oral Daily PRN Cherylann Ratel A, DO         Discharge Medications: Please see discharge summary for a list of discharge medications.  Relevant Imaging Results:  Relevant Lab Results:   Additional Information SS [id number]  Nila Nephew, LCSW

## 2019-07-09 NOTE — Care Management Important Message (Signed)
Important Message  Patient Details IM Letter given to [name] SW to present to the Patient Name: Zachary Ali MRN: [id number] Date of Birth: [birth year]   Medicare Important Message Given:  Yes     Kerin Salen 07/09/2019, 10:59 AM

## 2019-07-10 DIAGNOSIS — G40909 Epilepsy, unspecified, not intractable, without status epilepticus: Secondary | ICD-10-CM | POA: Diagnosis not present

## 2019-07-10 DIAGNOSIS — Z8673 Personal history of transient ischemic attack (TIA), and cerebral infarction without residual deficits: Secondary | ICD-10-CM | POA: Diagnosis not present

## 2019-07-10 DIAGNOSIS — F0390 Unspecified dementia without behavioral disturbance: Secondary | ICD-10-CM | POA: Diagnosis not present

## 2019-07-10 DIAGNOSIS — R1312 Dysphagia, oropharyngeal phase: Secondary | ICD-10-CM | POA: Diagnosis not present

## 2019-07-10 DIAGNOSIS — G9341 Metabolic encephalopathy: Secondary | ICD-10-CM | POA: Diagnosis not present

## 2019-07-10 DIAGNOSIS — N401 Enlarged prostate with lower urinary tract symptoms: Secondary | ICD-10-CM | POA: Diagnosis not present

## 2019-07-10 DIAGNOSIS — R41841 Cognitive communication deficit: Secondary | ICD-10-CM | POA: Diagnosis not present

## 2019-07-10 DIAGNOSIS — M6281 Muscle weakness (generalized): Secondary | ICD-10-CM | POA: Diagnosis not present

## 2019-07-10 DIAGNOSIS — R278 Other lack of coordination: Secondary | ICD-10-CM | POA: Diagnosis not present

## 2019-07-10 DIAGNOSIS — N400 Enlarged prostate without lower urinary tract symptoms: Secondary | ICD-10-CM | POA: Diagnosis not present

## 2019-07-10 DIAGNOSIS — N390 Urinary tract infection, site not specified: Secondary | ICD-10-CM | POA: Diagnosis not present

## 2019-07-10 DIAGNOSIS — A419 Sepsis, unspecified organism: Secondary | ICD-10-CM | POA: Diagnosis not present

## 2019-07-10 LAB — BASIC METABOLIC PANEL
Anion gap: 10 (ref 5–15)
BUN: 15 mg/dL (ref 8–23)
CO2: 21 mmol/L — ABNORMAL LOW (ref 22–32)
Calcium: 8.8 mg/dL — ABNORMAL LOW (ref 8.9–10.3)
Chloride: 108 mmol/L (ref 98–111)
Creatinine, Ser: 1.03 mg/dL (ref 0.61–1.24)
GFR calc Af Amer: >60 mL/min (ref >60)
GFR calc non Af Amer: >60 mL/min (ref >60)
Glucose, Bld: 73 mg/dL (ref 70–99)
Potassium: 3.7 mmol/L (ref 3.5–5.1)
Sodium: 139 mmol/L (ref 135–145)

## 2019-07-10 LAB — URINE CULTURE: Culture: NO GROWTH

## 2019-07-10 MED ORDER — CEPHALEXIN 500 MG PO CAPS: 500.0000 mg | ORAL_CAPSULE | Freq: Four times a day (QID) | ORAL | 0 refills | Status: AC

## 2019-07-10 NOTE — TOC Transition Note (Signed)
Transition of Care [facility]) - CM/SW Discharge Note   Patient Details  Name: Zachary Ali MRN: [id number] Date of Birth: November 23, 1928  Transition of [facility]) CM/SW Contact:  Nila Nephew, LCSW Phone Number: [phone] 07/10/2019, 12:47 PM   Clinical Narrative:    Pt admitting to [facility], room 3332, report [phone] Pt's wife updated and with help of caregiver completed admission paperwork for facility  [facility] transport arranged  BCBS Medicare Bernadene Bell) authorized admission    Final next level of care: Skilled Nursing Facility Barriers to Discharge: Barriers Resolved   Patient Goals and CMS Choice Patient states their goals for this hospitalization and ongoing recovery are:: wife: "he was walking well before the UTI but since then has been very weak" CMS Medicare.gov Compare Post Acute Care list provided to:: (wife) Choice offered to / list presented to : Spouse  Discharge Placement              Patient chooses bed at: [facility] Patient to be transferred to facility by: [place] Name of family member notified: wife Hope Patient and family notified of of transfer: 07/10/19  Discharge Plan and Services In-house Referral: Clinical Social Work   Post Acute Care Choice: [place]                               Social Determinants of Health (SDOH) Interventions     Readmission Risk Interventions No flowsheet data found.

## 2019-07-10 NOTE — Discharge Summary (Signed)
Physician Discharge Summary  Zachary Ali IDP:[id number] DOB: 11/25/1928 DOA: 07/06/2019  PCP: Wenda Low, MD  Admit date: 07/06/2019 Discharge date: 07/10/2019  Admitted From: Home Disposition:  SNF   Recommendations for Outpatient Follow-up:  1. Follow up with PCP in 1 week 2. Follow up for repeat COVID testing results. Patient asymptomatic and fever free at time of discharge.   Discharge Condition: Stable CODE STATUS: DNR  Diet recommendation: Regular   Brief/Interim Summary: Zachary Ali is a 85 y.o.malewith medical history significant ofdementia, recurrent UTI. Per wife, she reports that the patient has not felt well for the last 3 to 4 days. She says that he has been nauseous and not wanting to eat. He say said on several occasions that he is "hurting" but does not tell her where. She noted that yesterday and this morning he was vomiting. She does not note any fevers or chills. When awaking him this morning, he apparently slipped in his wheelchair and appeared weak. She became concerned and sent him to the ED.Lab work on admission revealed AKI and UTI. He was empirically treated with IV rocephin.   Discharge Diagnoses:  Principal Problem:   Sepsis secondary to UTI [facility]) Active Problems:   Altered mental state   UTI (urinary tract infection)   Acute renal failure (HCC)   AKI (acute kidney injury) (HCC)   Nausea and vomiting   Dementia without behavioral disturbance (HCC)   Abdominal pain   Acute metabolic encephalopathy   Sepsis likely secondary to UTI Patient admitted with criteria meeting sepsis with acute kidney injury, afebrile, slight leukocytosis, noted to be hypotensive on admission, urinalysis concerning for UTI. Started on empiric IV Rocephin. Urine culture obtained 9/21 canceled, obtained again 9/24 with no growth (obtained while on antibiotics). Treat total 5 day course with keflex (9/22-9/26)  AKI Likely secondary to a prerenal azotemia in the setting of  diuretics, nausea and vomiting with GI losses and decreased oral intake. Renal ultrasound negative for hydronephrosis.Renal function trending down/improving with hydration. Resolved Cr 1.03. Resume home meds.   Dementia without behavioral disturbance Continue home regimen of Depakote, Aricept, Lamictal, Namenda.  Acute metabolic encephalopathy Likely secondary to problem #1 in the setting of underlying dementia.   Abdominal pain Resolved   Discharge Instructions  Discharge Instructions    Diet general   Complete by: As directed    Increase activity slowly   Complete by: As directed      Allergies as of 07/10/2019   No Known Allergies     Medication List    STOP taking these medications   acetaminophen 325 MG tablet Commonly known as: TYLENOL   aspirin 81 MG EC tablet   Dilantin 100 MG ER capsule Generic drug: phenytoin   nitrofurantoin (macrocrystal-monohydrate) 100 MG capsule Commonly known as: MACROBID     TAKE these medications   cephALEXin 500 MG capsule Commonly known as: KEFLEX Take 1 capsule (500 mg total) by mouth 4 (four) times daily for 1 day.   CRANBERRY PO Take 1 tablet by mouth as needed (UTI control).   divalproex 500 MG DR tablet Commonly known as: DEPAKOTE Take 1 tablet (500 mg total) by mouth 2 (two) times daily.   donepezil 10 MG tablet Commonly known as: ARICEPT Take 1 tablet (10 mg total) by mouth at bedtime.   finasteride 5 MG tablet Commonly known as: PROSCAR Take 5 mg by mouth every morning.   furosemide 40 MG tablet Commonly known as: LASIX Take 40 mg by  mouth daily.   lamoTRIgine 100 MG tablet Commonly known as: LAMICTAL Take 1 tablet (100 mg total) by mouth daily. Please fill lamotrigine 25mg  Rx first, What changed:   when to take this  additional instructions  Another medication with the same name was removed. Continue taking this medication, and follow the directions you see here.   memantine 10 MG  tablet Commonly known as: NAMENDA Take 1 tablet (10 mg total) by mouth 2 (two) times daily.   metoprolol succinate 25 MG 24 hr tablet Commonly known as: TOPROL-XL Take 25 mg by mouth every morning.   potassium chloride 8 MEQ tablet Commonly known as: KLOR-CON Take 8 mEq by mouth daily.       No Known Allergies  Consultations:  None    Procedures/Studies: [facility]  Result Date: 07/06/2019 CLINICAL DATA:  Acute kidney injury EXAM: RENAL / URINARY TRACT ULTRASOUND COMPLETE COMPARISON:  Renal ultrasound 12/08/2014 FINDINGS: Right Kidney: Renal measurements: 11.7 x 5.6 x 5.1 cm = volume: 156 mL . Echogenicity within normal limits. No mass or hydronephrosis visualized. Left Kidney: Renal measurements: 10.8 x 6.0 x 4.8 cm = volume: 161 mL. Echogenicity within normal limits. No mass or hydronephrosis visualized. Bladder: Appears normal for degree of bladder distention. IMPRESSION: Negative renal ultrasound. Electronically Signed   By: Marlan Palauharles  Clark M.D.   On: 07/06/2019 15:15       Discharge Exam: Vitals:   07/10/19 0537 07/10/19 0548  BP: (!) 162/121 (!) 172/74  Pulse: 75   Resp: 18   Temp: 97.6 F (36.4 C)   SpO2: 98%      General: Pt is alert, awake, not in acute distress Cardiovascular: RRR, S1/S2 +, no edema Respiratory: CTA bilaterally, no wheezing, no rhonchi, no respiratory distress, no conversational dyspnea  Abdominal: Soft, NT, ND, bowel sounds + Extremities: no edema, no cyanosis Psych: Dementia    The results of significant diagnostics from this hospitalization (including imaging, microbiology, ancillary and laboratory) are listed below for reference.     Microbiology: Recent Results (from the past 240 hour(s))  SARS CORONAVIRUS 2 (TAT 6-24 HRS) Nasopharyngeal Nasopharyngeal Swab     Status: None   Collection Time: 07/06/19 12:33 PM   Specimen: Nasopharyngeal Swab  Result Value Ref Range Status   SARS Coronavirus 2 NEGATIVE NEGATIVE Final    Comment:  (NOTE) SARS-CoV-2 target nucleic acids are NOT DETECTED. The SARS-CoV-2 RNA is generally detectable in upper and lower respiratory specimens during the acute phase of infection. Negative results do not preclude SARS-CoV-2 infection, do not rule out co-infections with other pathogens, and should not be used as the sole basis for treatment or other patient management decisions. Negative results must be combined with clinical observations, patient history, and epidemiological information. The expected result is Negative. Fact Sheet for Patients: HairSlick.nohttps://www.fda.gov/media/138098/download Fact Sheet for Healthcare Providers: quierodirigir.comhttps://www.fda.gov/media/138095/download This test is not yet approved or cleared by the [place] FDA and  has been authorized for detection and/or diagnosis of SARS-CoV-2 by FDA under an Emergency Use Authorization (EUA). This EUA will remain  in effect (meaning this test can be used) for the duration of the COVID-19 declaration under Section 56 4(b)(1) of the Act, 21 U.S.C. section 360bbb-3(b)(1), unless the authorization is terminated or revoked sooner. Performed at [facility], [address] [place], [place] [id number]   Culture, blood (routine x 2)     Status: None (Preliminary result)   Collection Time: 07/06/19  4:28 PM   Specimen: BLOOD  Result  Value Ref Range Status   Specimen Description   Final    BLOOD RIGHT ANTECUBITAL Performed at [facility], [address], [place], [place] [address]    Special Requests   Final    BOTTLES DRAWN AEROBIC AND ANAEROBIC Blood Culture adequate volume Performed at [facility], [address], [place], [place] [address]    Culture   Final    NO GROWTH 3 DAYS Performed at [facility], [address] [place], [place] [address]    Report Status PENDING  Incomplete  Culture, blood (routine x 2)     Status: None (Preliminary result)   Collection Time: 07/06/19  4:28 PM    Specimen: BLOOD  Result Value Ref Range Status   Specimen Description   Final    BLOOD LEFT ANTECUBITAL Performed at [facility], [address] [place], [place] [address]    Special Requests   Final    BOTTLES DRAWN AEROBIC AND ANAEROBIC Blood Culture adequate volume Performed at [facility], [address], [place], [place] [address]    Culture   Final    NO GROWTH 3 DAYS Performed at [facility], [address] [place], [place] [address]    Report Status PENDING  Incomplete  Urine Culture     Status: None   Collection Time: 07/09/19  5:44 AM   Specimen: Urine, Random  Result Value Ref Range Status   Specimen Description   Final    URINE, RANDOM Performed at [facility], [address] [place], [place] [address]    Special Requests   Final    NONE Performed at [facility], [address] [place], [place] [address]    Culture   Final    NO GROWTH Performed at [facility], [address] [place], [place] [address]    Report Status 07/10/2019 FINAL  Final     Labs: BNP (last 3 results) No results for input(s): BNP in the last 8760 hours. Basic Metabolic Panel: Recent Labs  Lab 07/06/19 1149 07/06/19 1628 07/07/19 0541 07/08/19 0509 07/09/19 0550 07/10/19 0549  NA 134*  --  137 138 138 139  K 3.8  --  3.3* 3.7 3.5 3.7  CL 95*  --  104 109 109 108  CO2 26  --  24 21* 21* 21*  GLUCOSE 104*  --  88 82 84 73  BUN 44*  --  34* 26* 20 15  CREATININE 1.63* 1.44* 1.31* 1.18 1.05 1.03  CALCIUM 8.8*  --  8.0* 8.3* 8.3* 8.8*  MG  --   --   --  2.1  --   --    Liver Function Tests: Recent Labs  Lab 07/06/19 1149 07/07/19 0541  AST 34 18  ALT 29 23  ALKPHOS 109 80  BILITOT 1.2 0.7  PROT 6.5 5.1*  ALBUMIN 3.0* 2.4*   Recent Labs  Lab 07/06/19 1149  LIPASE 45   No results for input(s): AMMONIA in the last 168 hours. CBC: Recent Labs  Lab 07/06/19 1149 07/06/19 1628  07/07/19 0541 07/08/19 0509  WBC 10.9* 8.4 11.9* 5.6  NEUTROABS 8.3*  --   --  3.7  HGB 12.7* 11.6* 10.5* 10.4*  HCT 39.2 35.9* 32.1* 33.9*  MCV 99.7 100.3* 102.2* 104.3*  PLT 130* 125* 122* 109*   Cardiac Enzymes: No results for input(s): CKTOTAL, CKMB, CKMBINDEX, TROPONINI in the last  168 hours. BNP: Invalid input(s): POCBNP CBG: No results for input(s): GLUCAP in the last 168 hours. D-Dimer No results for input(s): DDIMER in the last 72 hours. Hgb A1c No results for input(s): HGBA1C in the last 72 hours. Lipid Profile No results for input(s): CHOL, HDL, LDLCALC, TRIG, CHOLHDL, LDLDIRECT in the last 72 hours. Thyroid function studies No results for input(s): TSH, T4TOTAL, T3FREE, THYROIDAB in the last 72 hours.  Invalid input(s): FREET3 Anemia work up No results for input(s): VITAMINB12, FOLATE, FERRITIN, TIBC, IRON, RETICCTPCT in the last 72 hours. Urinalysis    Component Value Date/Time   COLORURINE YELLOW 07/06/2019 1125   APPEARANCEUR TURBID (A) 07/06/2019 1125   LABSPEC 1.011 07/06/2019 1125   PHURINE 7.0 07/06/2019 1125   GLUCOSEU NEGATIVE 07/06/2019 1125   HGBUR SMALL (A) 07/06/2019 1125   BILIRUBINUR NEGATIVE 07/06/2019 1125   KETONESUR NEGATIVE 07/06/2019 1125   PROTEINUR 100 (A) 07/06/2019 1125   UROBILINOGEN 0.2 12/08/2014 1629   NITRITE NEGATIVE 07/06/2019 1125   LEUKOCYTESUR LARGE (A) 07/06/2019 1125   Sepsis Labs Invalid input(s): PROCALCITONIN,  WBC,  LACTICIDVEN Microbiology Recent Results (from the past 240 hour(s))  SARS CORONAVIRUS 2 (TAT 6-24 HRS) Nasopharyngeal Nasopharyngeal Swab     Status: None   Collection Time: 07/06/19 12:33 PM   Specimen: Nasopharyngeal Swab  Result Value Ref Range Status   SARS Coronavirus 2 NEGATIVE NEGATIVE Final    Comment: (NOTE) SARS-CoV-2 target nucleic acids are NOT DETECTED. The SARS-CoV-2 RNA is generally detectable in upper and lower respiratory specimens during the acute phase of infection.  Negative results do not preclude SARS-CoV-2 infection, do not rule out co-infections with other pathogens, and should not be used as the sole basis for treatment or other patient management decisions. Negative results must be combined with clinical observations, patient history, and epidemiological information. The expected result is Negative. Fact Sheet for Patients: HairSlick.no Fact Sheet for Healthcare Providers: quierodirigir.com This test is not yet approved or cleared by the [place] FDA and  has been authorized for detection and/or diagnosis of SARS-CoV-2 by FDA under an Emergency Use Authorization (EUA). This EUA will remain  in effect (meaning this test can be used) for the duration of the COVID-19 declaration under Section 56 4(b)(1) of the Act, 21 U.S.C. section 360bbb-3(b)(1), unless the authorization is terminated or revoked sooner. Performed at [facility], [address], [place], [place] [address]   Culture, blood (routine x 2)     Status: None (Preliminary result)   Collection Time: 07/06/19  4:28 PM   Specimen: BLOOD  Result Value Ref Range Status   Specimen Description   Final    BLOOD RIGHT ANTECUBITAL Performed at [facility], [address] [place], [place] [address]    Special Requests   Final    BOTTLES DRAWN AEROBIC AND ANAEROBIC Blood Culture adequate volume Performed at [facility], [address], [place], [place] [address]    Culture   Final    NO GROWTH 3 DAYS Performed at [facility], [address] [place], [place] [address]    Report Status PENDING  Incomplete  Culture, blood (routine x 2)     Status: None (Preliminary result)   Collection Time: 07/06/19  4:28 PM   Specimen: BLOOD  Result Value Ref Range Status   Specimen Description   Final    BLOOD LEFT ANTECUBITAL Performed at [facility], [address] [place], [place]  [address]  Special Requests   Final    BOTTLES DRAWN AEROBIC AND ANAEROBIC Blood Culture adequate volume Performed at [facility], [address] [place], [place] [address]    Culture   Final    NO GROWTH 3 DAYS Performed at [facility], [address] [place], [place] [address]    Report Status PENDING  Incomplete  Urine Culture     Status: None   Collection Time: 07/09/19  5:44 AM   Specimen: Urine, Random  Result Value Ref Range Status   Specimen Description   Final    URINE, RANDOM Performed at [facility], [address], [place], [place] [address]    Special Requests   Final    NONE Performed at [facility], [address], [place], [place] [address]    Culture   Final    NO GROWTH Performed at [facility], [address], [place], [place] [address]    Report Status 07/10/2019 FINAL  Final     Patient was seen and examined on the day of discharge and was found to be in stable condition. Time coordinating discharge: 35 minutes including assessment and coordination of care, as well as examination of the patient.   SIGNED:  Noralee Stain, DO [facility] 07/10/2019, 12:00 PM

## 2019-07-11 LAB — CULTURE, BLOOD (ROUTINE X 2)
Culture: NO GROWTH
Culture: NO GROWTH
Special Requests: ADEQUATE
Special Requests: ADEQUATE

## 2019-07-12 LAB — NOVEL CORONAVIRUS, NAA (HOSP ORDER, SEND-OUT TO REF LAB; TAT 18-24 HRS): SARS-CoV-2, NAA: NOT DETECTED

## 2019-07-14 DIAGNOSIS — N390 Urinary tract infection, site not specified: Secondary | ICD-10-CM | POA: Diagnosis not present

## 2019-07-14 DIAGNOSIS — N400 Enlarged prostate without lower urinary tract symptoms: Secondary | ICD-10-CM | POA: Diagnosis not present

## 2019-07-14 DIAGNOSIS — Z8673 Personal history of transient ischemic attack (TIA), and cerebral infarction without residual deficits: Secondary | ICD-10-CM | POA: Diagnosis not present

## 2019-07-14 DIAGNOSIS — G40909 Epilepsy, unspecified, not intractable, without status epilepticus: Secondary | ICD-10-CM | POA: Diagnosis not present

## 2019-07-16 DIAGNOSIS — Z20828 Contact with and (suspected) exposure to other viral communicable diseases: Secondary | ICD-10-CM | POA: Diagnosis not present

## 2019-07-17 DIAGNOSIS — N390 Urinary tract infection, site not specified: Secondary | ICD-10-CM | POA: Diagnosis not present

## 2019-07-17 DIAGNOSIS — G9340 Encephalopathy, unspecified: Secondary | ICD-10-CM | POA: Diagnosis not present

## 2019-07-17 DIAGNOSIS — R569 Unspecified convulsions: Secondary | ICD-10-CM | POA: Diagnosis not present

## 2019-07-17 DIAGNOSIS — G309 Alzheimer's disease, unspecified: Secondary | ICD-10-CM | POA: Diagnosis not present

## 2019-07-20 DIAGNOSIS — N390 Urinary tract infection, site not specified: Secondary | ICD-10-CM | POA: Diagnosis not present

## 2019-07-20 DIAGNOSIS — F0390 Unspecified dementia without behavioral disturbance: Secondary | ICD-10-CM | POA: Diagnosis not present

## 2019-07-20 DIAGNOSIS — Z8673 Personal history of transient ischemic attack (TIA), and cerebral infarction without residual deficits: Secondary | ICD-10-CM | POA: Diagnosis not present

## 2019-07-20 DIAGNOSIS — G40909 Epilepsy, unspecified, not intractable, without status epilepticus: Secondary | ICD-10-CM | POA: Diagnosis not present

## 2019-07-22 DIAGNOSIS — Z20828 Contact with and (suspected) exposure to other viral communicable diseases: Secondary | ICD-10-CM | POA: Diagnosis not present

## 2019-07-26 ENCOUNTER — Other Ambulatory Visit: Payer: Self-pay | Admitting: Neurology

## 2019-07-28 DIAGNOSIS — Z8673 Personal history of transient ischemic attack (TIA), and cerebral infarction without residual deficits: Secondary | ICD-10-CM | POA: Diagnosis not present

## 2019-07-28 DIAGNOSIS — G40909 Epilepsy, unspecified, not intractable, without status epilepticus: Secondary | ICD-10-CM | POA: Diagnosis not present

## 2019-07-28 DIAGNOSIS — M6281 Muscle weakness (generalized): Secondary | ICD-10-CM | POA: Diagnosis not present

## 2019-07-28 DIAGNOSIS — I10 Essential (primary) hypertension: Secondary | ICD-10-CM | POA: Diagnosis not present

## 2019-07-29 ENCOUNTER — Other Ambulatory Visit: Payer: Self-pay | Admitting: Neurology

## 2019-08-04 ENCOUNTER — Encounter: Payer: Self-pay | Admitting: Neurology

## 2019-08-04 ENCOUNTER — Other Ambulatory Visit: Payer: Self-pay

## 2019-08-04 ENCOUNTER — Ambulatory Visit (INDEPENDENT_AMBULATORY_CARE_PROVIDER_SITE_OTHER): Payer: Medicare Other | Admitting: Neurology

## 2019-08-04 VITALS — BP 132/67 | HR 72 | Temp 97.5°F

## 2019-08-04 DIAGNOSIS — R269 Unspecified abnormalities of gait and mobility: Secondary | ICD-10-CM | POA: Diagnosis not present

## 2019-08-04 DIAGNOSIS — R413 Other amnesia: Secondary | ICD-10-CM

## 2019-08-04 DIAGNOSIS — G40909 Epilepsy, unspecified, not intractable, without status epilepticus: Secondary | ICD-10-CM | POA: Diagnosis not present

## 2019-08-04 DIAGNOSIS — Z20828 Contact with and (suspected) exposure to other viral communicable diseases: Secondary | ICD-10-CM | POA: Diagnosis not present

## 2019-08-04 MED ORDER — DIVALPROEX SODIUM 500 MG PO DR TAB: 500.0000 mg | DELAYED_RELEASE_TABLET | Freq: Two times a day (BID) | ORAL | 11 refills | Status: AC

## 2019-08-04 MED ORDER — LAMOTRIGINE 100 MG PO TABS: 100.0000 mg | ORAL_TABLET | Freq: Two times a day (BID) | ORAL | 4 refills | Status: DC

## 2019-08-04 MED ORDER — DIVALPROEX SODIUM 500 MG PO DR TAB: 500.0000 mg | DELAYED_RELEASE_TABLET | Freq: Two times a day (BID) | ORAL | 4 refills | Status: DC

## 2019-08-04 MED ORDER — LAMOTRIGINE 100 MG PO TABS: 100.0000 mg | ORAL_TABLET | Freq: Two times a day (BID) | ORAL | 11 refills | Status: AC

## 2019-08-04 NOTE — Progress Notes (Signed)
PATIENT: Zachary Ali DOB: 01/01/1929  REASON FOR VISIT: follow up HISTORY FROM: patient  HISTORY OF PRESENT ILLNESS:  HISTORY: initial evaluation by Dr Terrace Arabia 08/17/10. He is referred to this clinic, because there was no longer recurrent seizure, and with this worsening gait difficulty, wondering if we can make some adjustment of his epileptiic medications  PMHx.He has past medical history of tachycardia is taking beta blocker, also has a history of seizure, has been on long-term epileptic medications. He began to experience seizures since age 28, initially it was frequent Petit mal seizure, up to 12-16 episodes in a day, he has been treated with combination of Dilantin, and phenobarbital since age 32, without effectively control his petit mal seizure. In addition he also has some infrequent generalized tonic-clonic seizure. In 1976, he was started on Depakote, which has drastically change his seizure, his petit mal seizure was under much better control, while he was on Depakote and phenobarbital combination, he developed a generalized tonic-clonic seizure, Dilantin was reintroduced later, he has been on current dose of 3 medication for 35 years. Dilantin100 mg t.i.d. phenobarbital 100 mg every morning. Depakote500 mg t.i.d.  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 Over the past couple years, he was noticed to have unsteady gait. he denied incontinence, bilateral lower extremity paresthesia. He has successfully weaned off phenobarbital now, he feels better afterwards, there is no recurrent seizure. He is continue taking depakote  tid, dilantin 100 tid. MRI brain showed mild atrophy, no acute lesions. EEG, had mild background slowing, no epilepsy discharges. he is still on Depakote 500 mg 3 times a day, Dilantin 100 mg 3 times a day, no recurrent seizure, He was admitted to the hospital on June 24 2014, for worsening confusion,  dysarthria, was diagnosed with UTI, repeat MRI of the brain showed no acute lesions, MRA showed intracranial atherosclerotic disease, he was started on baby aspirin daily.  He was discharged home after rehabilitation,overall doing very well,  Depakote level in hospital September 2015, 54.8, Dilantin was 10  UPDATE Sep 03 2017:YY He has fell few time, Oct 19, Aug 29 2017,worsening unsteady gait, no recurrent seizure, he does not want to change his antiepileptic medications, he is taking dilantin  tid, Depakote DR  tid.   Wife also concerned about his ability of driving.  03/24/2019 Dr. Terrace Arabia via VV:  He lives at home with his wife, overall  He is doing very well, he had no recurrent seizure, tolerating Dilantin ER 100 mg 3 times a day, Depakote 500 mg 3 times a day, and also taking Aricept, Namenda, has slow worsening memory loss  After discussed with patient and his wife, we decided to taper off Dilantin, 100 mg decrements every 2 weeks  Most recent seizure was on January 08 2018,  When he missed his dilantin  UPDATE Aug 04 2019: He was admitted to the hospital on July 10, 2019 for recurrent UTI, sepsis, is now discharged to [facility], wife reported he might had a seizure there, he is taking Depakote DR 500 mg twice a day, lamotrigine 100 mg daily instead of twice a day.  Recurrent seizure on April 13, 2019 after he was tapered off Dilantin, he has been doing well with Depakote DR 500 mg twice a day, lamotrigine 100 mg twice a day at home prior to his hospital admission,  He had slow worsening memory loss, could not recognize his wife sometimes, today's Mini-Mental Status Examination  is 9/30  REVIEW OF SYSTEMS: Out of a complete 14 system review of symptoms, the patient complains only of the following symptoms, and all other reviewed systems are negative. As above  ALLERGIES: No Known Allergies  HOME MEDICATIONS: Outpatient Medications Prior to Visit    Medication Sig Dispense Refill   CRANBERRY PO Take 400 mg by mouth daily.      divalproex (DEPAKOTE) 500 MG DR tablet Take 1 tablet (500 mg total) by mouth 2 (two) times daily. 180 tablet 0   donepezil (ARICEPT) 10 MG tablet Take 1 tablet (10 mg total) by mouth at bedtime. 90 tablet 4   finasteride (PROSCAR) 5 MG tablet Take 5 mg by mouth every morning.   2   furosemide (LASIX) 40 MG tablet Take 40 mg by mouth daily.   10   lamoTRIgine (LAMICTAL) 100 MG tablet Take 1 tablet (100 mg total) by mouth daily. Please fill lamotrigine 25mg  Rx first, (Patient taking differently: Take 100 mg by mouth daily. ) 60 tablet 11   memantine (NAMENDA) 10 MG tablet Take 1 tablet (10 mg total) by mouth 2 (two) times daily. 180 tablet 4   metoprolol succinate (TOPROL-XL) 25 MG 24 hr tablet Take 25 mg by mouth every morning.      potassium chloride (KLOR-CON) 8 MEQ tablet Take 8 mEq by mouth daily.   5   No facility-administered medications prior to visit.     PAST MEDICAL HISTORY: Past Medical History:  Diagnosis Date   Allergic rhinitis    BPH (benign prostatic hypertrophy)    seen urology, Dr.   Bronchitis    episode of 2/10   Cerebral hemorrhage (HCC)    approx 50 years ago   Dyslipidemia    diet control   Edema leg    hx of   Elevated PSA    dr. 4/10 PSA 3-6; no PSA-urology f/u for exam yrly   Foley catheter in place    Insomnia    Metabolic encephalopathy    OA (osteoarthritis) of knee    left    Palpitations    w/u with Card-Dr. Earlene Plater   Paroxysmal supraventricular tachycardia (HCC)    Pneumonia    hx of    Seizure (HCC) 01/08/2018   since childhood, Dr. 01/10/2018; off phenoparb since 2011, after many yr, doing ok   Small vessel disease (HCC)    SOB (shortness of breath)    walking distances/climbing stairs   Stroke (HCC)    hx of CVA   SVT (supraventricular tachycardia) (HCC)    Urinary tract infection    hx of     PAST SURGICAL HISTORY: Past  Surgical History:  Procedure Laterality Date   CATARACT EXTRACTION     2011-12/left    GREEN LIGHT LASER TURP (TRANSURETHRAL RESECTION OF PROSTATE N/A 04/05/2015   Procedure: GREEN LIGHT LASER TURP (TRANSURETHRAL RESECTION OF PROSTATE;  Surgeon: 04/07/2015, MD;  Location: [facility];  Service: Urology;  Laterality: N/A;   HEMORRHOID SURGERY     Jerilee Field ECHOCARDIOGRAPHY     ok; stress test ok-05-2013 nl LVF, tr AR   VASECTOMY      FAMILY HISTORY: Family History  Problem Relation Age of Onset   Heart disease Mother    Coronary artery disease Mother    Coronary artery disease Father    Heart disease Father    Heart failure Other     SOCIAL HISTORY: Social History   Socioeconomic History   Marital status: Married  Spouse name: Hope   Number of children: 0   Years of education: College   Highest education level: Not on file  Occupational History   Not on file  Social Needs   Financial resource strain: Not hard at all   Food insecurity    Worry: Never true    Inability: Never true   Transportation needs    Medical: No    Non-medical: No  Tobacco Use   Smoking status: Former Smoker    Packs/day: 2.00    Years: 20.00    Pack years: 40.00    Types: Cigarettes    Quit date: 10/15/1974    Years since quitting: 44.8   Smokeless tobacco: Never Used  Substance and Sexual Activity   Alcohol use: Not Currently    Comment: occas    Drug use: No   Sexual activity: Not Currently  Lifestyle   Physical activity    Days per week: 0 days    Minutes per session: 0 min   Stress: Only a little  Relationships   Event organiserocial connections    Talks on phone: Never    Gets together: Never    Attends religious service: Never    Active member of club or organization: No    Attends meetings of clubs or organizations: Never    Relationship status: Married   Intimate partner violence    Fear of current or ex partner: No    Emotionally abused: No    Physically abused:  No    Forced sexual activity: No  Other Topics Concern   Not on file  Social History Narrative   Patient lives at home with wife [place].    Patient has no children.    Patient is college graduated.    Patient is retired.    Patient is right handed.          PHYSICAL EXAM  Vitals:   08/04/19 1053  BP: 132/67  Pulse: 72  Temp: (!) 97.5 F (36.4 C)   There is no height or weight on file to calculate BMI.  Generalized: Well developed, in no acute distress  No injury or tenderness to his head or cervical spine noted, bruising to posterior hand surface, wrapped Kerlex Neurological examination  MMSE - Mini Mental State Exam 08/04/2019 09/17/2018 09/04/2017  Not completed: - (No Data) -  Orientation to time 1 2 4   Orientation to Place 0 1 5  Registration 3 3 3   Attention/ Calculation 0 5 5  Recall 0 0 1  Language- name 2 objects 0 2 2  Language- repeat 0 1 1  Language- follow 3 step command 3 3 3   Language- read & follow direction 1 1 1   Write a sentence 1 0 1  Copy design 0 1 0  Total score 9 19 26   Animal naming 2 Cranial nerve II-XII: Pupils were equal round reactive to light. Extraocular movements were full, visual field were full on confrontational test. Facial sensation and strength were normal. Uvula tongue midline. Head turning and shoulder shrug  were normal and symmetric. Motor: The motor testing reveals 4 over 5 strength of all 4 extremities. Good symmetric motor tone is noted throughout.  Sensory: Sensory testing is intact to soft touch on all 4 extremities. No evidence of extinction is noted.  Coordination: Difficulty understanding instruction, able to perform hand to nose bilaterally Gait and station: Gait is deferred Reflexes: Deep tendon reflexes are hypoactive and symmetric   DIAGNOSTIC DATA (LABS, IMAGING, TESTING) -  I reviewed patient records, labs, notes, testing and imaging myself where available.  Lab Results  Component Value Date   WBC 5.6  07/08/2019   HGB 10.4 (L) 07/08/2019   HCT 33.9 (L) 07/08/2019   MCV 104.3 (H) 07/08/2019   PLT 109 (L) 07/08/2019      Component Value Date/Time   NA 139 07/10/2019 0549   NA 141 04/29/2019 1414   K 3.7 07/10/2019 0549   CL 108 07/10/2019 0549   CO2 21 (L) 07/10/2019 0549   GLUCOSE 73 07/10/2019 0549   BUN 15 07/10/2019 0549   BUN 28 (H) 04/29/2019 1414   CREATININE 1.03 07/10/2019 0549   CALCIUM 8.8 (L) 07/10/2019 0549   PROT 5.1 (L) 07/07/2019 0541   PROT 6.4 04/29/2019 1414   ALBUMIN 2.4 (L) 07/07/2019 0541   ALBUMIN 4.2 04/29/2019 1414   AST 18 07/07/2019 0541   ALT 23 07/07/2019 0541   ALKPHOS 80 07/07/2019 0541   BILITOT 0.7 07/07/2019 0541   BILITOT 0.6 04/29/2019 1414   GFRNONAA >60 07/10/2019 0549   GFRAA >60 07/10/2019 0549   Lab Results  Component Value Date   CHOL 142 06/26/2014   HDL 55 06/26/2014   LDLCALC 66 06/26/2014   TRIG 106 06/26/2014   CHOLHDL 2.6 06/26/2014   Lab Results  Component Value Date   HGBA1C 5.8 (H) 06/26/2014   Lab Results  Component Value Date   EGBTDVVO16 073 07/07/2019   Lab Results  Component Value Date   TSH 3.150 09/03/2017    ASSESSMENT AND PLAN 85 y.o. year old male    Epilepsy Last visit was on 03/24/2019, decided to taper off Dilantin, continue Depakote 500 mg 3 times a day 04/13/2019 had grand mal seizure witnessed by his wife, titrating dose of lamotrigine was added on, while taking Depakote DR 500 mg 3 times a day, lamotrigine 75 mg twice a day, he was noted to be more drowsy, decreased appetite, in July 2020 Depakote level was 84, lamotrigine level was 6.7  Medication was changed to Depakote DR 500mg  bid, Lamotrigine 100mg  bid.   Worsening dementia Mini-Mental Status Examination is 9 out of 30 today Continue Namenda 10 mg twice a day, Aricept 10 mg daily    Marcial Pacas, M.D. Ph.D.  [facility] [place], [place] [address] Phone: [phone] Fax:      [phone]

## 2019-08-05 DIAGNOSIS — G40909 Epilepsy, unspecified, not intractable, without status epilepticus: Secondary | ICD-10-CM | POA: Diagnosis not present

## 2019-08-05 DIAGNOSIS — Z8673 Personal history of transient ischemic attack (TIA), and cerebral infarction without residual deficits: Secondary | ICD-10-CM | POA: Diagnosis not present

## 2019-08-05 DIAGNOSIS — I10 Essential (primary) hypertension: Secondary | ICD-10-CM | POA: Diagnosis not present

## 2019-08-05 DIAGNOSIS — F0390 Unspecified dementia without behavioral disturbance: Secondary | ICD-10-CM | POA: Diagnosis not present

## 2019-08-07 ENCOUNTER — Emergency Department (HOSPITAL_COMMUNITY)
Admission: EM | Admit: 2019-08-07 | Discharge: 2019-08-08 | Disposition: A | Discharge status: Home / Self Care | Payer: Medicare Other | Attending: Emergency Medicine | Admitting: Emergency Medicine

## 2019-08-07 ENCOUNTER — Other Ambulatory Visit: Payer: Self-pay

## 2019-08-07 ENCOUNTER — Encounter (HOSPITAL_COMMUNITY): Payer: Self-pay | Admitting: Emergency Medicine

## 2019-08-07 DIAGNOSIS — R4689 Other symptoms and signs involving appearance and behavior: Secondary | ICD-10-CM

## 2019-08-07 DIAGNOSIS — F0391 Unspecified dementia with behavioral disturbance: Secondary | ICD-10-CM | POA: Insufficient documentation

## 2019-08-07 DIAGNOSIS — R6889 Other general symptoms and signs: Secondary | ICD-10-CM | POA: Diagnosis present

## 2019-08-07 DIAGNOSIS — Z87891 Personal history of nicotine dependence: Secondary | ICD-10-CM | POA: Diagnosis not present

## 2019-08-07 DIAGNOSIS — R0902 Hypoxemia: Secondary | ICD-10-CM | POA: Diagnosis not present

## 2019-08-07 DIAGNOSIS — R0689 Other abnormalities of breathing: Secondary | ICD-10-CM | POA: Diagnosis not present

## 2019-08-07 DIAGNOSIS — I959 Hypotension, unspecified: Secondary | ICD-10-CM | POA: Diagnosis not present

## 2019-08-07 LAB — URINALYSIS, ROUTINE W REFLEX MICROSCOPIC
Bilirubin Urine: NEGATIVE
Glucose, UA: NEGATIVE mg/dL
Hgb urine dipstick: NEGATIVE
Ketones, ur: 5 mg/dL — AB
Leukocytes,Ua: NEGATIVE
Nitrite: NEGATIVE
Protein, ur: NEGATIVE mg/dL
Specific Gravity, Urine: 1.019 (ref 1.005–1.030)
pH: 5 (ref 5.0–8.0)

## 2019-08-07 NOTE — ED Notes (Signed)
Attempted to call and give report to nursing home staff, advised to call back in 15-20 minutes.

## 2019-08-07 NOTE — ED Provider Notes (Signed)
Zachary Ali   CSN: [id number] Arrival date & time: 08/07/19  1750     History   Chief Complaint "Abnormal vitals"  HPI Zachary Ali is a 85 y.o. male with a history of advanced dementia, recent hospitalization for urosepsis discharged a month ago to [facility], presenting back to the emergency department from home with concerns for "abnormal vitals".  The patient is advanced dementia cannot provide any history.  History is provided by his wife Zachary Ali on the phone.  She states that the patient was just discharged today from [facility] to home.  They were set up with a home health aide.  The health aide told hope that the patient's "vitals are abnormal" earlier today and advised that he be sent to the ER for a "medical work-up".  She is unsure whether this may be another urine infection.  She states that the patient is mostly bedbound.  He tends to be cantankerous and often refuses help.  He needs assistance getting out of bed and getting around the house.   The patient is a level 5 caveat due to dementia.    HPI  Past Medical History:  Diagnosis Date  . Allergic rhinitis   . BPH (benign prostatic hypertrophy)    seen urology, Dr. Rosana Hoes  . Bronchitis    episode of 2/10  . Cerebral hemorrhage (HCC)    approx 50 years ago  . Dyslipidemia    diet control  . Edema leg    hx of  . Elevated PSA    dr. Rosana Hoes PSA 3-6; no PSA-urology f/u for exam yrly  . Foley catheter in place   . Insomnia   . Metabolic encephalopathy   . OA (osteoarthritis) of knee    left   . Palpitations    w/u with Card-Dr. Marlou Porch  . Paroxysmal supraventricular tachycardia ([place])   . Pneumonia    hx of   . Seizure ([place]) 01/08/2018   since childhood, Dr. Evelena Leyden; off phenoparb since 2011, after many yr, doing ok  . Small vessel disease ([place])   . SOB (shortness of breath)    walking distances/climbing stairs  . Stroke [facility])    hx of CVA  .  SVT (supraventricular tachycardia) ([place])   . Urinary tract infection    hx of     Patient Active Problem List   Diagnosis Date Noted  . Sepsis secondary to UTI ([place]) 07/07/2019  . Acute metabolic encephalopathy 31/54/0086  . AKI (acute kidney injury) ([place]) 07/06/2019  . Nausea and vomiting 07/06/2019  . Dementia without behavioral disturbance ([place]) 07/06/2019  . Abdominal pain 07/06/2019  . Memory loss 09/17/2018  . Mild cognitive impairment 09/03/2017  . Gait abnormality 09/03/2017  . Seizure disorder ([place]) 08/28/2016  . UTI (urinary tract infection) 12/08/2014  . Acute renal failure ([place]) 12/08/2014  . Urinary retention 12/08/2014  . Thrombocytopenia ([place]) 12/08/2014  . Anemia 12/08/2014  . Lung nodule 12/08/2014  . Fall at home 12/08/2014  . Diarrhea 12/08/2014  . UTI (lower urinary tract infection) 12/08/2014  . Altered mental state 06/25/2014  . CVA (cerebral infarction) 06/25/2014  . 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 Benign prostate hyperplasia 03/03/2012  . Elevated prostate specific antigen (PSA) 09/03/2011  . Urinary urgency 09/03/2011    Past Surgical History:  Procedure Laterality Date  . CATARACT EXTRACTION     2011-12/left   . GREEN LIGHT LASER TURP (TRANSURETHRAL RESECTION OF PROSTATE N/A 04/05/2015  Procedure: GREEN LIGHT LASER TURP (TRANSURETHRAL RESECTION OF PROSTATE;  Surgeon: Jerilee FieldMatthew Eskridge, MD;  Location: [facility];  Service: Urology;  Laterality: N/A;  . HEMORRHOID SURGERY    . US ECHOCARDIOGRAPHY     ok; stress test ok-05-2013 nl LVF, tr AR  . VASECTOMY          Home Medications    Prior to Admission medications   Medication Sig Start Date End Date Taking? Authorizing Provider  CRANBERRY PO Take 400 mg by mouth daily.     [provider]  divalproex (DEPAKOTE) 500 MG DR tablet Take 1 tablet (500 mg total) by mouth 2 (two) times daily. 08/04/19   Levert FeinsteinYan, Yijun, MD  donepezil (ARICEPT) 10 MG tablet Take 1 tablet (10  mg total) by mouth at bedtime. 03/24/19   Levert FeinsteinYan, Yijun, MD  finasteride (PROSCAR) 5 MG tablet Take 5 mg by mouth every morning.  08/09/14   [provider]  furosemide (LASIX) 40 MG tablet Take 40 mg by mouth daily.  08/15/15   [provider]  lamoTRIgine (LAMICTAL) 100 MG tablet Take 1 tablet (100 mg total) by mouth 2 (two) times daily. Please fill lamotrigine 25mg  Rx first, 08/04/19   Levert FeinsteinYan, Yijun, MD  memantine (NAMENDA) 10 MG tablet Take 1 tablet (10 mg total) by mouth 2 (two) times daily. 03/24/19   Levert FeinsteinYan, Yijun, MD  metoprolol succinate (TOPROL-XL) 25 MG 24 hr tablet Take 25 mg by mouth every morning.     [provider]  potassium chloride (KLOR-CON) 8 MEQ tablet Take 8 mEq by mouth daily.  11/22/14   [provider]    Family History Family History  Problem Relation Age of Onset  . Heart disease Mother   . Coronary artery disease Mother   . Coronary artery disease Father   . Heart disease Father   . Heart failure Other     Social History Social History   Tobacco Use  . Smoking status: Former Smoker    Packs/day: 2.00    Years: 20.00    Pack years: 40.00    Types: Cigarettes    Quit date: 10/15/1974    Years since quitting: 44.8  . Smokeless tobacco: Never Used  Substance Use Topics  . Alcohol use: Not Currently    Comment: occas   . Drug use: No     Allergies   Patient has no known allergies.   Review of Systems Review of Systems  Unable to perform ROS: Dementia     Physical Exam Updated Vital Signs BP (!) 112/57 (BP Location: Right Arm)   Pulse 60   Temp 98.5 F (36.9 C) (Axillary)   Resp 15   SpO2 95%   Physical Exam Vitals signs and nursing Ali reviewed.  Constitutional:      General: He is not in acute distress.    Appearance: He is well-developed.     Comments: Lying on bed sleeping, easily arousable  HENT:     Head: Normocephalic and atraumatic.  Eyes:     Conjunctiva/sclera: Conjunctivae normal.  Neck:      Musculoskeletal: Neck supple.  Cardiovascular:     Rate and Rhythm: Normal rate and regular rhythm.     Pulses: Normal pulses.  Pulmonary:     Effort: Pulmonary effort is normal. No respiratory distress.     Breath sounds: Normal breath sounds.  Abdominal:     General: There is no distension.     Palpations: Abdomen is soft. There is no  mass.     Tenderness: There is no abdominal tenderness.  Skin:    General: Skin is warm and dry.  Neurological:     Mental Status: He is alert.     Comments: Answers focused questions appropriately Moving all extremities Uncooperative with remainder of neurological exam      ED Treatments / Results  Labs (all labs ordered are listed, but only abnormal results are displayed) Labs Reviewed  URINALYSIS, ROUTINE W REFLEX MICROSCOPIC - Abnormal; Notable for the following components:      Result Value   Color, Urine AMBER (*)    Ketones, ur 5 (*)    All other components within normal limits    EKG None  Radiology No results found.  Procedures Procedures (including critical care time)  Medications Ordered in ED Medications - No data to display   Initial Impression / Assessment and Plan / ED Course  I have reviewed the triage vital signs and the nursing notes.  Pertinent labs & imaging results that were available during my care of the patient were reviewed by me and considered in my medical decision making (see chart for details).  85 yo male w/ advanced dementia, hx of UTI and sepsis, presenting from home after 1 day of discharge from [facility] with "abnormal vitals."  It is not clear from talking to his wife what the exact concern was.  Here his vital signs are all within normal limits.  He is afebrile.  He is willfully uncooperative with his medical exam, but his wife states he is "always like that."  I have no acute concerns from my medical exam to suggest an infection or traumatic injury.  He appears to be near his  baseline mental state per his wife's report - I have no reason to suspect CVA or stroke.  Plan to check UA for signs of infection. If negative, I anticipate discharge - I am told by our nursing staff that he has a bed that has been made available back at [facility].  His wife had expressed some concerns that she did not feel she was properly situated or staffed at home to care for her husband tonight.  This Ali was dictated using dragon dictation software.  Please be aware that there may be minor translation errors as a result of this oral dictation      Final Clinical Impressions(s) / ED Diagnoses   Final diagnoses:  Behavioral change    ED Discharge Orders    None       Nakia Koble, Kermit Balo, MD 08/08/19 1017

## 2019-08-07 NOTE — ED Notes (Signed)
Pt has condom cath on. Will continue to check pt to see if he has voided for specimen.

## 2019-08-07 NOTE — ED Triage Notes (Signed)
Patient arrived by EMS from home. Patient's wife wants patient to be evaluated due to possible UTI.   Hx of Dementia.   Pt was previously admitted to a nursing home.

## 2019-08-07 NOTE — ED Notes (Signed)
PTAR notified for transport back to [facility].

## 2019-08-10 DIAGNOSIS — Z8673 Personal history of transient ischemic attack (TIA), and cerebral infarction without residual deficits: Secondary | ICD-10-CM | POA: Diagnosis not present

## 2019-08-10 DIAGNOSIS — F0390 Unspecified dementia without behavioral disturbance: Secondary | ICD-10-CM | POA: Diagnosis not present

## 2019-08-10 DIAGNOSIS — G40909 Epilepsy, unspecified, not intractable, without status epilepticus: Secondary | ICD-10-CM | POA: Diagnosis not present

## 2019-08-10 DIAGNOSIS — I10 Essential (primary) hypertension: Secondary | ICD-10-CM | POA: Diagnosis not present

## 2019-08-11 DIAGNOSIS — Z20828 Contact with and (suspected) exposure to other viral communicable diseases: Secondary | ICD-10-CM | POA: Diagnosis not present

## 2019-08-12 DIAGNOSIS — M6281 Muscle weakness (generalized): Secondary | ICD-10-CM | POA: Diagnosis not present

## 2019-08-12 DIAGNOSIS — R627 Adult failure to thrive: Secondary | ICD-10-CM | POA: Diagnosis not present

## 2019-08-12 DIAGNOSIS — F0390 Unspecified dementia without behavioral disturbance: Secondary | ICD-10-CM | POA: Diagnosis not present

## 2019-08-12 DIAGNOSIS — J189 Pneumonia, unspecified organism: Secondary | ICD-10-CM | POA: Diagnosis not present

## 2019-08-12 DIAGNOSIS — G40909 Epilepsy, unspecified, not intractable, without status epilepticus: Secondary | ICD-10-CM | POA: Diagnosis not present

## 2019-08-13 DIAGNOSIS — R627 Adult failure to thrive: Secondary | ICD-10-CM | POA: Diagnosis not present

## 2019-08-13 DIAGNOSIS — G309 Alzheimer's disease, unspecified: Secondary | ICD-10-CM | POA: Diagnosis not present

## 2019-08-13 DIAGNOSIS — G9340 Encephalopathy, unspecified: Secondary | ICD-10-CM | POA: Diagnosis not present

## 2019-08-13 DIAGNOSIS — R269 Unspecified abnormalities of gait and mobility: Secondary | ICD-10-CM | POA: Diagnosis not present

## 2019-08-13 DIAGNOSIS — R569 Unspecified convulsions: Secondary | ICD-10-CM | POA: Diagnosis not present

## 2019-08-13 DIAGNOSIS — G40909 Epilepsy, unspecified, not intractable, without status epilepticus: Secondary | ICD-10-CM | POA: Diagnosis not present

## 2019-08-13 DIAGNOSIS — F0390 Unspecified dementia without behavioral disturbance: Secondary | ICD-10-CM | POA: Diagnosis not present

## 2019-08-13 DIAGNOSIS — R05 Cough: Secondary | ICD-10-CM | POA: Diagnosis not present

## 2019-08-14 DIAGNOSIS — R627 Adult failure to thrive: Secondary | ICD-10-CM | POA: Diagnosis not present

## 2019-08-14 DIAGNOSIS — R05 Cough: Secondary | ICD-10-CM | POA: Diagnosis not present

## 2019-08-14 DIAGNOSIS — F0390 Unspecified dementia without behavioral disturbance: Secondary | ICD-10-CM | POA: Diagnosis not present

## 2019-08-14 DIAGNOSIS — G40909 Epilepsy, unspecified, not intractable, without status epilepticus: Secondary | ICD-10-CM | POA: Diagnosis not present

## 2019-08-17 DIAGNOSIS — F0390 Unspecified dementia without behavioral disturbance: Secondary | ICD-10-CM | POA: Diagnosis not present

## 2019-08-17 DIAGNOSIS — R634 Abnormal weight loss: Secondary | ICD-10-CM | POA: Diagnosis not present

## 2019-08-17 DIAGNOSIS — R627 Adult failure to thrive: Secondary | ICD-10-CM | POA: Diagnosis not present

## 2019-08-17 DIAGNOSIS — G40909 Epilepsy, unspecified, not intractable, without status epilepticus: Secondary | ICD-10-CM | POA: Diagnosis not present

## 2019-08-18 DIAGNOSIS — G40909 Epilepsy, unspecified, not intractable, without status epilepticus: Secondary | ICD-10-CM | POA: Diagnosis not present

## 2019-08-18 DIAGNOSIS — Z20828 Contact with and (suspected) exposure to other viral communicable diseases: Secondary | ICD-10-CM | POA: Diagnosis not present

## 2019-08-18 DIAGNOSIS — Z8673 Personal history of transient ischemic attack (TIA), and cerebral infarction without residual deficits: Secondary | ICD-10-CM | POA: Diagnosis not present

## 2019-08-18 DIAGNOSIS — R627 Adult failure to thrive: Secondary | ICD-10-CM | POA: Diagnosis not present

## 2019-08-18 DIAGNOSIS — F0390 Unspecified dementia without behavioral disturbance: Secondary | ICD-10-CM | POA: Diagnosis not present

## 2019-08-27 ENCOUNTER — Telehealth: Payer: Self-pay | Admitting: Neurology

## 2019-08-27 NOTE — Telephone Encounter (Signed)
Pt's wife called to inform provider that the pt has passed away.

## 2019-09-01 ENCOUNTER — Ambulatory Visit: Payer: Medicare Other | Admitting: Sports Medicine

## 2019-09-15 DEATH — deceased

## 2020-02-02 ENCOUNTER — Ambulatory Visit: Payer: Medicare Other | Admitting: Neurology

## 2020-12-16 IMAGING — CT CT HEAD WITHOUT CONTRAST
3 series · 16 of 46 positions shown, 19 images · non-contrast
Comparison: CT scan dated 01/08/2018

CLINICAL DATA: Altered level of consciousness. Multiple recent
falls.

EXAM:
CT HEAD WITHOUT CONTRAST
TECHNIQUE: Contiguous axial images were obtained from the base of the skull
through the vertex without intravenous contrast.

[Series 2: head wo · axial · 0.48mm/px · z∈[-110,+10]mm · 10 of 29 slices shown, 13 images]
[im 3/29  brain]
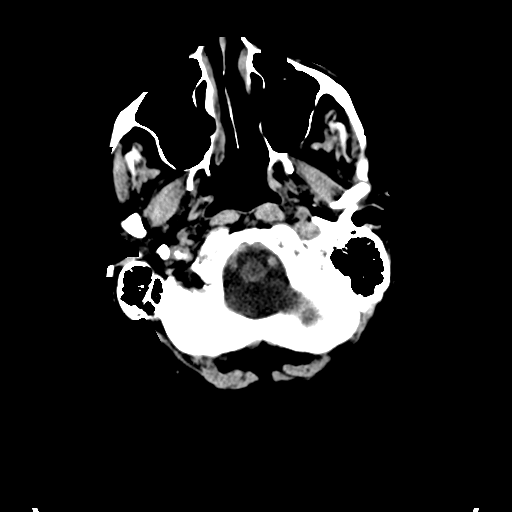
[im 3/29  bone]
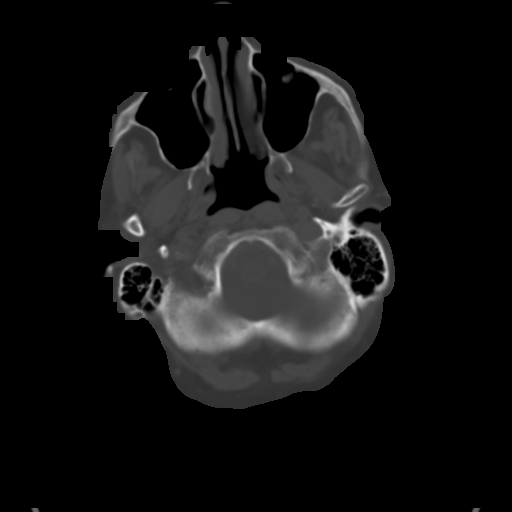
[im 6/29  brain]
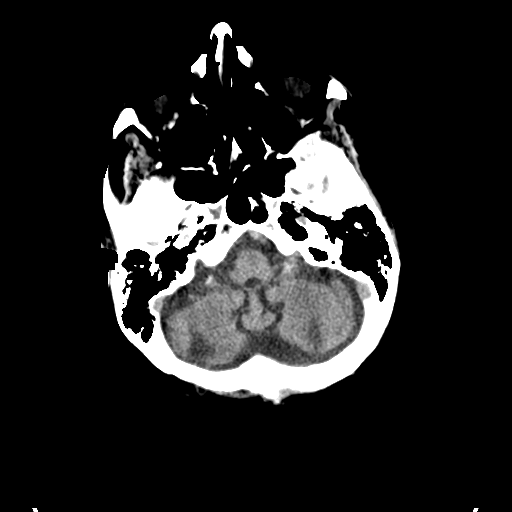
[im 8/29  brain]
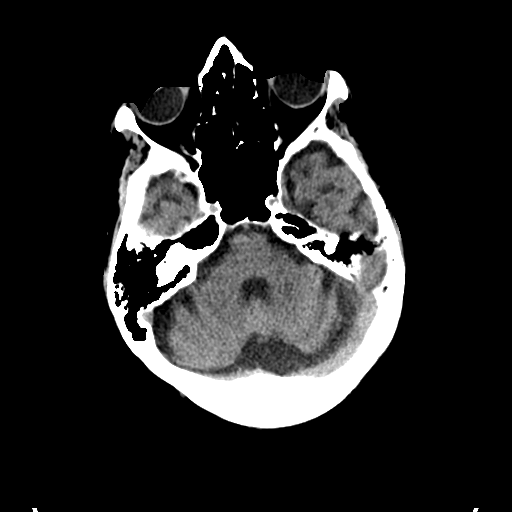
[im 11/29  brain]
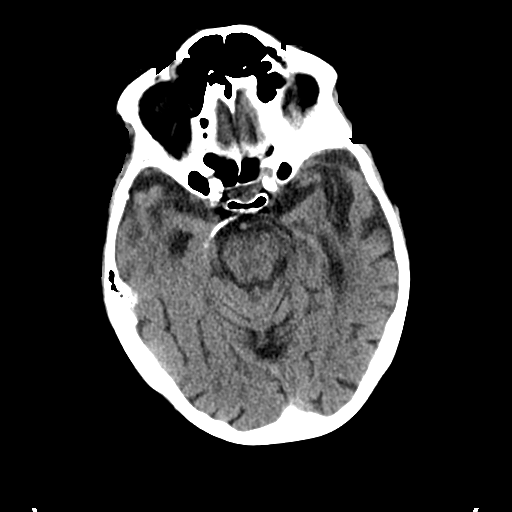
[im 14/29  brain]
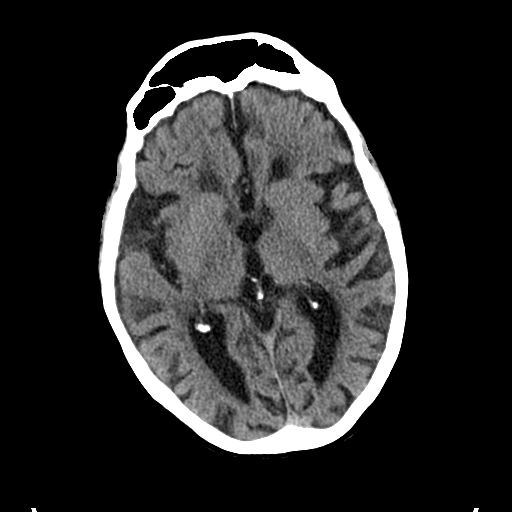
[im 14/29  bone]
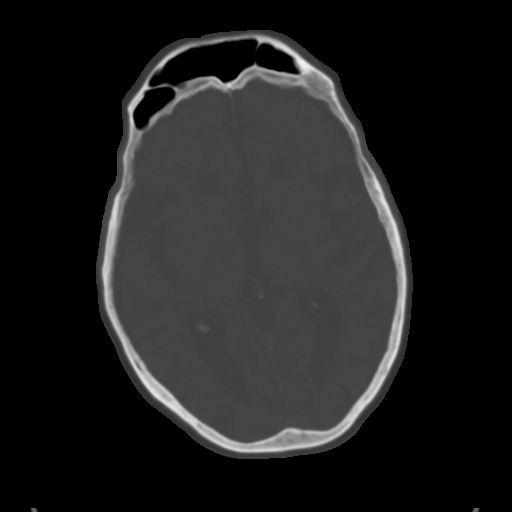
[im 16/29  brain]
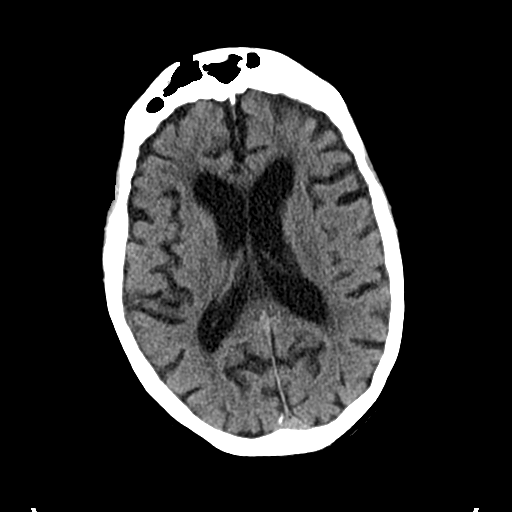
[im 19/29  brain]
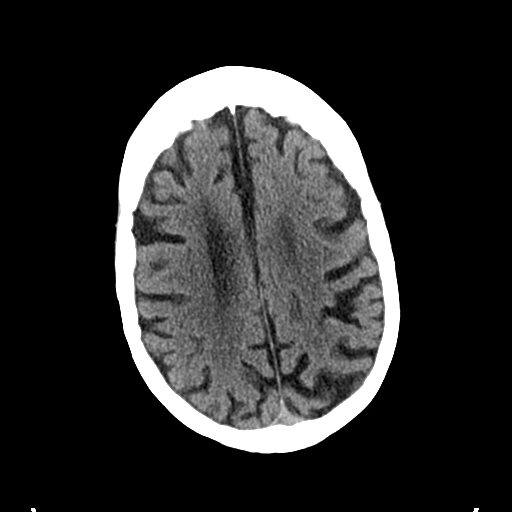
[im 22/29  brain]
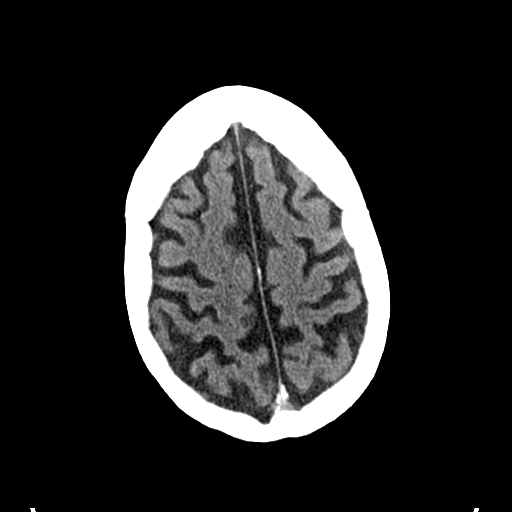
[im 24/29  brain]
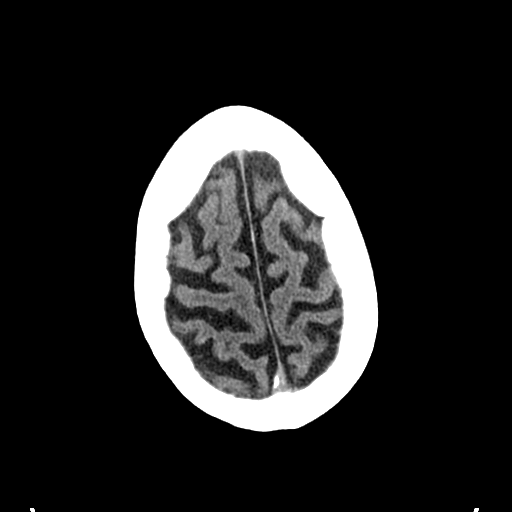
[im 24/29  bone]
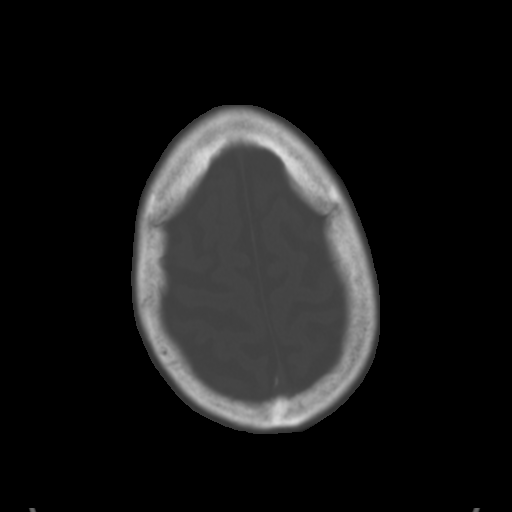
[im 27/29  brain]
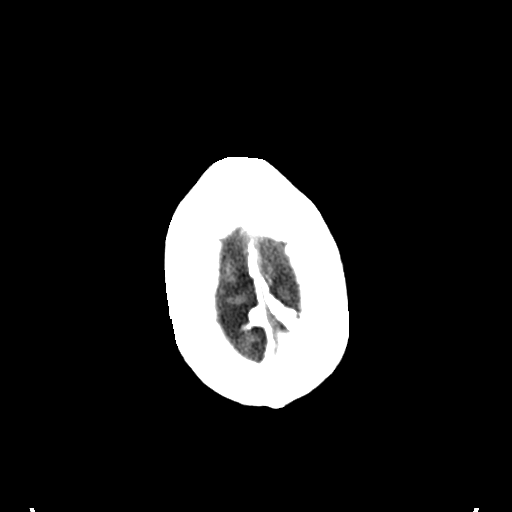

[Series 6: coronal soft tissue · coronal · 0.29mm/px · 3 of 68 slices shown]
[im 23/68  brain]
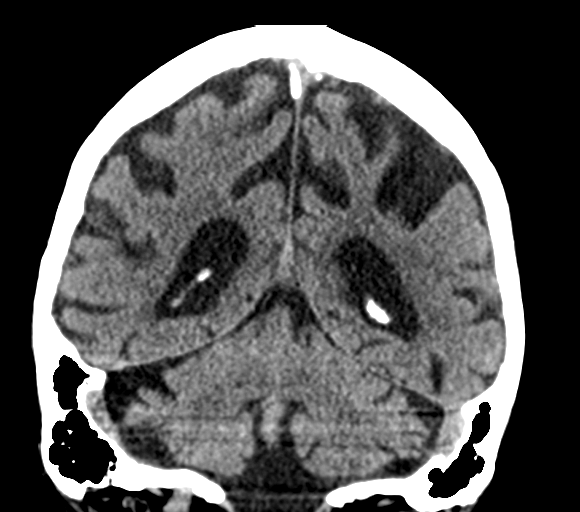
[im 30/68  brain]
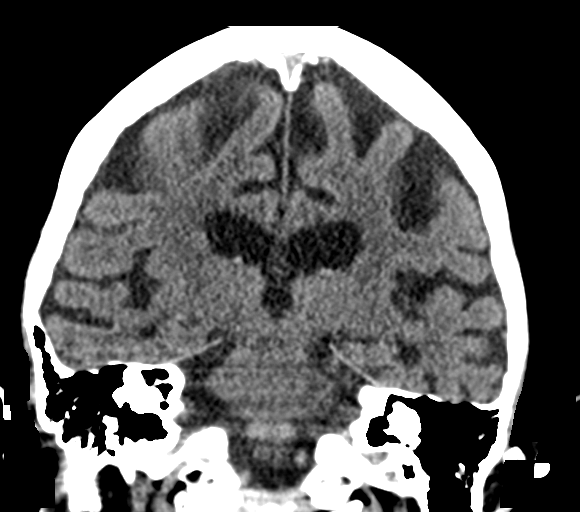
[im 38/68  brain]
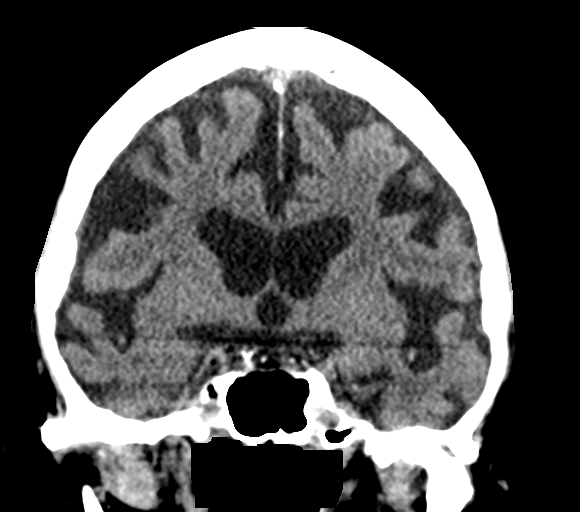

[Series 7: sagittal soft tissue · sagittal · 0.31mm/px · 3 of 53 slices shown]
[im 18/53  brain]
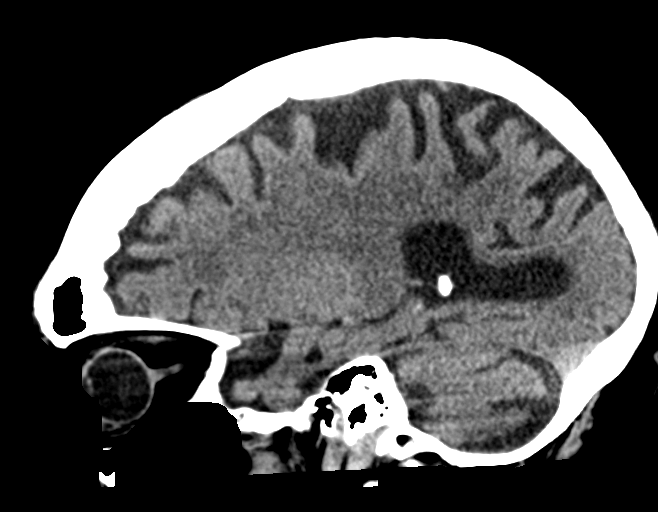
[im 27/53  brain]
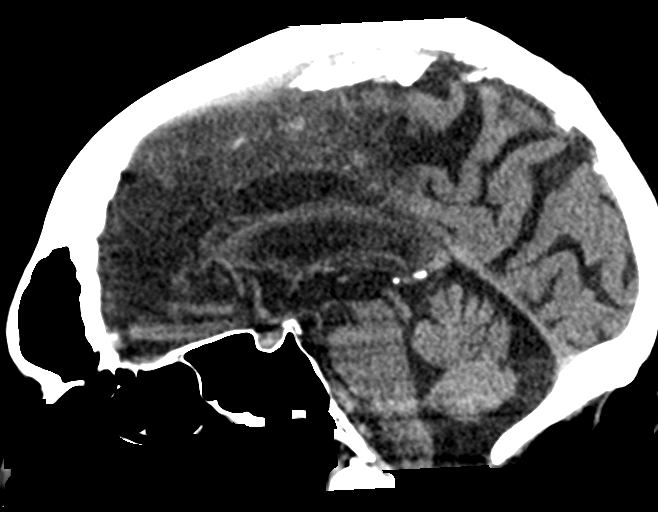
[im 35/53  brain]
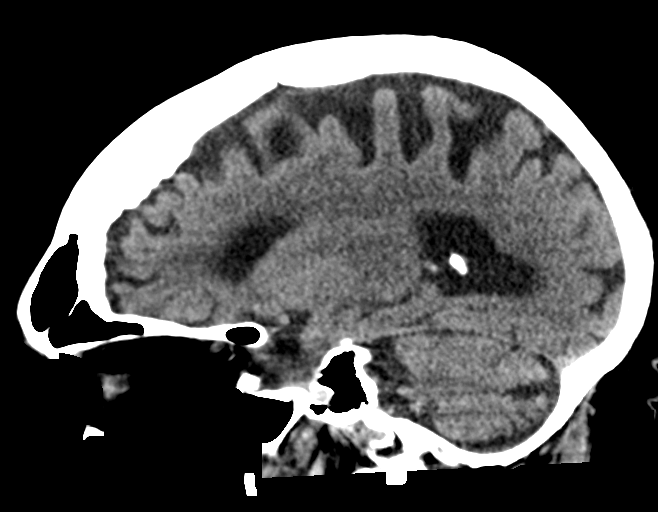

[16 of 46 positions shown; findings below may reference images not displayed]

FINDINGS: Brain: No evidence of acute infarction, hemorrhage, hydrocephalus,
extra-axial collection or mass lesion/mass effect. There is diffuse
chronic cerebral cortical and cerebellar atrophy with secondary
ventricular prominence, stable.

Vascular: No hyperdense vessel or unexpected calcification.

Skull: Normal. Negative for fracture or focal lesion.

Sinuses/Orbits: Normal.

Other: None
IMPRESSION: No acute abnormalities. Stable diffuse atrophy.
